# Patient Record
Sex: Male | Born: 1950
Health system: Southern US, Community
[De-identification: ages and names within clinical notes are randomized; demographics above are authoritative.]

## PROBLEM LIST (undated history)

## (undated) DIAGNOSIS — E785 Hyperlipidemia, unspecified: Secondary | ICD-10-CM

## (undated) DIAGNOSIS — I10 Essential (primary) hypertension: Secondary | ICD-10-CM

## (undated) DIAGNOSIS — E119 Type 2 diabetes mellitus without complications: Secondary | ICD-10-CM

## (undated) HISTORY — DX: Hyperlipidemia, unspecified: E78.5

---

## 2018-02-28 ENCOUNTER — Encounter (INDEPENDENT_AMBULATORY_CARE_PROVIDER_SITE_OTHER): Payer: Self-pay

## 2018-03-14 ENCOUNTER — Encounter (INDEPENDENT_AMBULATORY_CARE_PROVIDER_SITE_OTHER): Payer: Self-pay

## 2018-04-04 ENCOUNTER — Encounter (INDEPENDENT_AMBULATORY_CARE_PROVIDER_SITE_OTHER): Payer: Self-pay | Admitting: Vascular Surgery

## 2018-04-04 ENCOUNTER — Ambulatory Visit (INDEPENDENT_AMBULATORY_CARE_PROVIDER_SITE_OTHER): Payer: Medicare Other | Admitting: Vascular Surgery

## 2018-04-04 VITALS — BP 147/84 | HR 93 | Resp 16 | Ht 68.0 in | Wt 211.2 lb

## 2018-04-04 DIAGNOSIS — I739 Peripheral vascular disease, unspecified: Secondary | ICD-10-CM

## 2018-04-04 DIAGNOSIS — M79605 Pain in left leg: Secondary | ICD-10-CM

## 2018-04-04 DIAGNOSIS — E785 Hyperlipidemia, unspecified: Secondary | ICD-10-CM | POA: Diagnosis not present

## 2018-04-04 DIAGNOSIS — M79609 Pain in unspecified limb: Secondary | ICD-10-CM | POA: Insufficient documentation

## 2018-04-04 NOTE — Assessment & Plan Note (Signed)
The patient has undergone aCT angiogram.  I do not have the images for review but have reviewed the interpretation.  There appears to be mild aortoiliac disease and femoral-popliteal disease bilaterally.  The significant findings are of tibial disease most prominently in the posterior tibial arteries with stenosis/occlusion bilaterally. This finding would not typically cause hip and buttock claudication.  CT angiogram is certainly not a perfect study for disease, and I think an assessment with an exercise study with ABIs would be prudent.  If this is unrevealing, I would consider a back or hip work-up as the cause of his pain.  Certainly he does not have limb threatening issues requiring immediate intervention which is encouraging.  I will see him back following his noninvasive studies.  We discussed the pathophysiology and natural history of peripheral arterial disease.  He will continue his statin agent.  He is already on a blood thinner as well.  

## 2018-04-04 NOTE — Patient Instructions (Signed)
Peripheral Vascular Disease  Peripheral vascular disease (PVD) is a disease of the blood vessels that are not part of your heart and brain. A simple term for PVD is poor circulation. In most cases, PVD narrows the blood vessels that carry blood from your heart to the rest of your body. This can reduce the supply of blood to your arms, legs, and internal organs, like your stomach or kidneys. However, PVD most often affects a person's lower legs and feet. Without treatment, PVD tends to get worse. PVD can also lead to acute ischemic limb. This is when an arm or leg suddenly cannot get enough blood. This is a medical emergency. Follow these instructions at home: Lifestyle  Do not use any products that contain nicotine or tobacco, such as cigarettes and e-cigarettes. If you need help quitting, ask your doctor.  Lose weight if you are overweight. Or, stay at a healthy weight as told by your doctor.  Eat a diet that is low in fat and cholesterol. If you need help, ask your doctor.  Exercise regularly. Ask your doctor for activities that are right for you. General instructions  Take over-the-counter and prescription medicines only as told by your doctor.  Take good care of your feet: ? Wear comfortable shoes that fit well. ? Check your feet often for any cuts or sores.  Keep all follow-up visits as told by your doctor This is important. Contact a doctor if:  You have cramps in your legs when you walk.  You have leg pain when you are at rest.  You have coldness in a leg or foot.  Your skin changes.  You are unable to get or have an erection (erectile dysfunction).  You have cuts or sores on your feet that do not heal. Get help right away if:  Your arm or leg turns cold, numb, and blue.  Your arms or legs become red, warm, swollen, painful, or numb.  You have chest pain.  You have trouble breathing.  You suddenly have weakness in your face, arm, or leg.  You become very  confused or you cannot speak.  You suddenly have a very bad headache.  You suddenly cannot see. Summary  Peripheral vascular disease (PVD) is a disease of the blood vessels.  A simple term for PVD is poor circulation. Without treatment, PVD tends to get worse.  Treatment may include exercise, low fat and low cholesterol diet, and quitting smoking. This information is not intended to replace advice given to you by your health care provider. Make sure you discuss any questions you have with your health care provider. Document Released: 06/09/2009 Document Revised: 04/22/2016 Document Reviewed: 04/22/2016 Elsevier Interactive Patient Education  2019 [employer]  

## 2018-04-04 NOTE — Assessment & Plan Note (Signed)
lipid control important in reducing the progression of atherosclerotic disease. Continue statin therapy  

## 2018-04-04 NOTE — Assessment & Plan Note (Signed)
The patient has undergone aCT angiogram.  I do not have the images for review but have reviewed the interpretation.  There appears to be mild aortoiliac disease and femoral-popliteal disease bilaterally.  The significant findings are of tibial disease most prominently in the posterior tibial arteries with stenosis/occlusion bilaterally. This finding would not typically cause hip and buttock claudication.  CT angiogram is certainly not a perfect study for disease, and I think an assessment with an exercise study with ABIs would be prudent.  If this is unrevealing, I would consider a back or hip work-up as the cause of his pain.  Certainly he does not have limb threatening issues requiring immediate intervention which is encouraging.  I will see him back following his noninvasive studies.  We discussed the pathophysiology and natural history of peripheral arterial disease.  He will continue his statin agent.  He is already on a blood thinner as well.

## 2018-04-04 NOTE — Progress Notes (Signed)
Patient ID: Eric Guerrero, male   DOB: 1950-10-04, 68 y.o.   MRN: [id number]  Chief Complaint  Patient presents with  . Establish Care    HPI Eric RollsMohammad Guerrero is a 68 y.o. male.  I am asked to see the patient by Dr. Ellsworth Lennoxejan-Sie for evaluation of PAD.  The patient reports pain in his left buttock and hip that radiates down the back of his legs.  This happens only with walking and is relieved with rest.  This is only in the left leg with no right leg symptoms.  No ulceration.  No infection.  This has been going on now for a couple of years with slow progression.  His primary care physician ordered a CT angiogram.  I do not have the images for review but have reviewed the interpretation.  There appears to be mild aortoiliac disease and femoral-popliteal disease bilaterally.  The significant findings are of tibial disease most prominently in the posterior tibial arteries with stenosis/occlusion bilaterally.   Past Medical History:  Diagnosis Date  . Hyperlipidemia     History reviewed. No pertinent surgical history.  Family History No bleeding disorders, clotting disorders, autoimmune diseases, or aneurysms  Social History Social History   Tobacco Use  . Smoking status: Never Smoker  . Smokeless tobacco: Never Used  Substance Use Topics  . Alcohol use: Never    Frequency: Never  . Drug use: Never    No Known Allergies  Current Outpatient Medications  Medication Sig Dispense Refill  . celecoxib (CELEBREX) 200 MG capsule Take 200 mg by mouth daily.    . rivaroxaban (XARELTO) 2.5 MG TABS tablet Take 2.5 mg by mouth 2 (two) times daily.    . rosuvastatin (CRESTOR) 10 MG tablet Take 10 mg by mouth daily.     No current facility-administered medications for this visit.       REVIEW OF SYSTEMS (Negative unless checked)  Constitutional: [] Weight loss  [] Fever  [] Chills Cardiac: [] Chest pain   [] Chest pressure   [] Palpitations   [] Shortness of breath when laying flat   [] Shortness  of breath at rest   [] Shortness of breath with exertion. Vascular:  [x] Pain in legs with walking   [] Pain in legs at rest   [] Pain in legs when laying flat   [x] Claudication   [] Pain in feet when walking  [] Pain in feet at rest  [] Pain in feet when laying flat   [] History of DVT   [] Phlebitis   [] Swelling in legs   [] Varicose veins   [] Non-healing ulcers Pulmonary:   [] Uses home oxygen   [] Productive cough   [] Hemoptysis   [] Wheeze  [] COPD   [] Asthma Neurologic:  [] Dizziness  [] Blackouts   [] Seizures   [] History of stroke   [] History of TIA  [] Aphasia   [] Temporary blindness   [] Dysphagia   [] Weakness or numbness in arms   [] Weakness or numbness in legs Musculoskeletal:  [] Arthritis   [] Joint swelling   [] Joint pain   [] Low back pain Hematologic:  [] Easy bruising  [] Easy bleeding   [] Hypercoagulable state   [] Anemic  [] Hepatitis Gastrointestinal:  [] Blood in stool   [] Vomiting blood  [] Gastroesophageal reflux/heartburn   [] Abdominal pain Genitourinary:  [] Chronic kidney disease   [] Difficult urination  [] Frequent urination  [] Burning with urination   [] Hematuria Skin:  [] Rashes   [] Ulcers   [] Wounds Psychological:  [] History of anxiety   []  History of major depression.    Physical Exam BP (!) 147/84 (BP Location: Left Arm, Patient Position:  Sitting, Cuff Size: Large)   Pulse 93   Resp 16   Ht 5\' 8"  (1.727 m)   Wt 211 lb 3.2 oz (95.8 kg)   BMI 32.11 kg/m  Gen:  WD/WN, NAD. Appears younger than stated age. Head: [place]/AT, No temporalis wasting.  Ear/Nose/Throat: Hearing grossly intact, nares w/o erythema or drainage, oropharynx w/o Erythema/Exudate Eyes: Conjunctiva clear, sclera non-icteric  Neck: trachea midline.  No bruit or JVD.  Pulmonary:  Good air movement, clear to auscultation bilaterally.  Cardiac: RRR, normal S1, S2 Vascular:  Vessel Right Left  Radial Palpable Palpable                          PT 1+ Palpable Trace Palpable  DP Palpable Palpable   Gastrointestinal: soft,  non-tender/non-distended.  Musculoskeletal: M/S 5/5 throughout.  Extremities without ischemic changes.  No deformity or atrophy. No edema. Neurologic: Sensation grossly intact in extremities.  Symmetrical.  Speech is fluent. Motor exam as listed above. Psychiatric: Judgment intact, Mood & affect appropriate for pt's clinical situation. Dermatologic: No rashes or ulcers noted.  No cellulitis or open wounds.   Radiology No results found.  Labs No results found for this or any previous visit (from the past 2160 hour(s)).  Assessment/Plan:  Hyperlipidemia lipid control important in reducing the progression of atherosclerotic disease. Continue statin therapy   Pain in limb The patient has undergone aCT angiogram.  I do not have the images for review but have reviewed the interpretation.  There appears to be mild aortoiliac disease and femoral-popliteal disease bilaterally.  The significant findings are of tibial disease most prominently in the posterior tibial arteries with stenosis/occlusion bilaterally. This finding would not typically cause hip and buttock claudication.  CT angiogram is certainly not a perfect study for disease, and I think an assessment with an exercise study with ABIs would be prudent.  If this is unrevealing, I would consider a back or hip work-up as the cause of his pain.  Certainly he does not have limb threatening issues requiring immediate intervention which is encouraging.  I will see him back following his noninvasive studies.  We discussed the pathophysiology and natural history of peripheral arterial disease.  He will continue his statin agent.  He is already on a blood thinner as well.   PAD (peripheral artery disease) (HCC) The patient has undergone aCT angiogram.  I do not have the images for review but have reviewed the interpretation.  There appears to be mild aortoiliac disease and femoral-popliteal disease bilaterally.  The significant findings are of tibial  disease most prominently in the posterior tibial arteries with stenosis/occlusion bilaterally. This finding would not typically cause hip and buttock claudication.  CT angiogram is certainly not a perfect study for disease, and I think an assessment with an exercise study with ABIs would be prudent.  If this is unrevealing, I would consider a back or hip work-up as the cause of his pain.  Certainly he does not have limb threatening issues requiring immediate intervention which is encouraging.  I will see him back following his noninvasive studies.  We discussed the pathophysiology and natural history of peripheral arterial disease.  He will continue his statin agent.  He is already on a blood thinner as well.       Festus Barren 04/04/2018, 12:37 PM   This note was created with Dragon medical transcription system.  Any errors from dictation are unintentional.

## 2018-04-05 ENCOUNTER — Ambulatory Visit (INDEPENDENT_AMBULATORY_CARE_PROVIDER_SITE_OTHER): Payer: Medicare Other

## 2018-04-05 ENCOUNTER — Encounter (INDEPENDENT_AMBULATORY_CARE_PROVIDER_SITE_OTHER): Payer: Self-pay | Admitting: Vascular Surgery

## 2018-04-05 ENCOUNTER — Ambulatory Visit (INDEPENDENT_AMBULATORY_CARE_PROVIDER_SITE_OTHER): Payer: Medicare Other | Admitting: Vascular Surgery

## 2018-04-05 VITALS — BP 147/84 | HR 93 | Resp 16 | Ht 68.0 in | Wt 209.4 lb

## 2018-04-05 DIAGNOSIS — I739 Peripheral vascular disease, unspecified: Secondary | ICD-10-CM

## 2018-04-05 DIAGNOSIS — E785 Hyperlipidemia, unspecified: Secondary | ICD-10-CM | POA: Diagnosis not present

## 2018-04-05 DIAGNOSIS — M79605 Pain in left leg: Secondary | ICD-10-CM

## 2018-04-05 NOTE — Progress Notes (Signed)
Subjective:    Patient ID: Eric Guerrero, male    DOB: [birth year], 68 y.o.   MRN: [id number] Chief Complaint  Patient presents with  . Follow-up   Patient presents to review vascular studies.  The patient was last seen yesterday.  The patient continues to endorse left lower extremity claudication-like symptoms.  Denies any rest pain or ulcer formation to the bilateral lower extremity.  The patient underwent a exercise ABI which was notable for right: 0.98, triphasic tibial waveforms.  Post exercise ABI shows moderate decrease in ABI from 0.98 2.66 suggesting ischemic changes. Left: 0.87, triphasic tibial waveforms.  Post exercise ABI shows moderate decrease in ABI from 0.98 2.58 suggesting ischemic changes.  The patient denies any right lower extremity symptoms.  Patient denies any fever, nausea or vomiting.  Denies any fever, nausea vomiting.  Review of Systems  Constitutional: Negative.   HENT: Negative.   Eyes: Negative.   Respiratory: Negative.   Cardiovascular:       Left lower extremity claudication  Gastrointestinal: Negative.   Endocrine: Negative.   Genitourinary: Negative.   Musculoskeletal: Negative.   Skin: Negative.   Allergic/Immunologic: Negative.   Neurological: Negative.   Hematological: Negative.   Psychiatric/Behavioral: Negative.       Objective:   Physical Exam Vitals signs reviewed.  Constitutional:      Appearance: Normal appearance.  HENT:     Head: Normocephalic and atraumatic.     Right Ear: External ear normal.     Left Ear: External ear normal.     Nose: Nose normal.     Mouth/Throat:     Mouth: Mucous membranes are moist.     Pharynx: Oropharynx is clear.  Eyes:     Extraocular Movements: Extraocular movements intact.     Conjunctiva/sclera: Conjunctivae normal.     Pupils: Pupils are equal, round, and reactive to light.  Neck:     Musculoskeletal: Normal range of motion.  Cardiovascular:     Rate and Rhythm: Normal rate and regular  rhythm.     Pulses: Normal pulses.     Heart sounds: Normal heart sounds.  Pulmonary:     Effort: Pulmonary effort is normal.     Breath sounds: Normal breath sounds.  Musculoskeletal: Normal range of motion.        General: No swelling.  Skin:    General: Skin is warm and dry.  Neurological:     General: No focal deficit present.     Mental Status: He is alert and oriented to person, place, and time.  Psychiatric:        Mood and Affect: Mood normal.        Behavior: Behavior normal.        Thought Content: Thought content normal.        Judgment: Judgment normal.    BP (!) 147/84 (BP Location: Right Arm, Patient Position: Sitting)   Pulse 93   Resp 16   Ht 5\' 8"  (1.727 m)   Wt 209 lb 6.4 oz (95 kg)   BMI 31.84 kg/m   Past Medical History:  Diagnosis Date  . Hyperlipidemia    Social History   Socioeconomic History  . Marital status: Unknown    Spouse name: Not on file  . Number of children: Not on file  . Years of education: Not on file  . Highest education level: Not on file  Occupational History  . Not on file  Social Needs  . Financial resource strain: Not  on file  . Food insecurity:    Worry: Not on file    Inability: Not on file  . Transportation needs:    Medical: Not on file    Non-medical: Not on file  Tobacco Use  . Smoking status: Never Smoker  . Smokeless tobacco: Never Used  Substance and Sexual Activity  . Alcohol use: Never    Frequency: Never  . Drug use: Never  . Sexual activity: Not on file  Lifestyle  . Physical activity:    Days per week: Not on file    Minutes per session: Not on file  . Stress: Not on file  Relationships  . Social connections:    Talks on phone: Not on file    Gets together: Not on file    Attends religious service: Not on file    Active member of club or organization: Not on file    Attends meetings of clubs or organizations: Not on file    Relationship status: Not on file  . Intimate partner violence:     Fear of current or ex partner: Not on file    Emotionally abused: Not on file    Physically abused: Not on file    Forced sexual activity: Not on file  Other Topics Concern  . Not on file  Social History Narrative  . Not on file   No past surgical history on file.  No family history on file.  No Known Allergies     Assessment & Plan:  Patient presents to review vascular studies.  The patient was last seen yesterday.  The patient continues to endorse left lower extremity claudication-like symptoms.  Denies any rest pain or ulcer formation to the bilateral lower extremity.  The patient underwent a exercise ABI which was notable for right: 0.98, triphasic tibial waveforms.  Post exercise ABI shows moderate decrease in ABI from 0.98 2.66 suggesting ischemic changes. Left: 0.87, triphasic tibial waveforms.  Post exercise ABI shows moderate decrease in ABI from 0.98 2.58 suggesting ischemic changes.  The patient denies any right lower extremity symptoms.  Patient denies any fever, nausea or vomiting.  Denies any fever, nausea vomiting.  1. PAD (peripheral artery disease) (HCC) - New Due to the patient's progressively worsening and lifestyle limiting left hip and buttocks pain in the setting of today's reduced left lower extremity ABI and ischemic changes noted post exercise I recommend a left lower extremity angiogram with possible intervention.  An angiogram would be able to assess the patient's degree of peripheral artery disease and if appropriate an attempt to revascularize the leg can be made at that time Procedure, risks and benefits explained to the patient All questions answered At this time, the patient would like to speak to his family and his primary care physician before moving forward with the procedure I gave the patient information on what an angiogram is and the name of the procedure He is to call the office if he would like to move forward He understands that this is a  progressive disease and over time his symptoms and degree of disease can worsen He expresses his understanding  2. Hyperlipidemia, unspecified hyperlipidemia type - Stable On statin Encouraged good control as its slows the progression of atherosclerotic disease  3. Pain of left lower extremity - Stable See above  Current Outpatient Medications on File Prior to Visit  Medication Sig Dispense Refill  . celecoxib (CELEBREX) 200 MG capsule Take 200 mg by mouth daily.    .Marland Kitchen  rivaroxaban (XARELTO) 2.5 MG TABS tablet Take 2.5 mg by mouth 2 (two) times daily.    . rosuvastatin (CRESTOR) 10 MG tablet Take 10 mg by mouth daily.     No current facility-administered medications on file prior to visit.    There are no Patient Instructions on file for this visit. No follow-ups on file.   A , PA-C

## 2018-07-13 ENCOUNTER — Ambulatory Visit: Admit: 2018-07-13 | Payer: Self-pay | Admitting: Gastroenterology

## 2018-07-13 SURGERY — COLONOSCOPY WITH PROPOFOL
Anesthesia: General

## 2019-04-24 ENCOUNTER — Ambulatory Visit: Payer: Medicare Other | Attending: Internal Medicine | Admitting: Physical Therapy

## 2019-04-24 ENCOUNTER — Other Ambulatory Visit: Payer: Self-pay

## 2019-04-24 ENCOUNTER — Encounter: Payer: Self-pay | Admitting: Physical Therapy

## 2019-04-24 DIAGNOSIS — G8929 Other chronic pain: Secondary | ICD-10-CM | POA: Diagnosis present

## 2019-04-24 DIAGNOSIS — M25552 Pain in left hip: Secondary | ICD-10-CM | POA: Diagnosis present

## 2019-04-24 DIAGNOSIS — M5442 Lumbago with sciatica, left side: Secondary | ICD-10-CM | POA: Diagnosis not present

## 2019-04-24 DIAGNOSIS — R262 Difficulty in walking, not elsewhere classified: Secondary | ICD-10-CM

## 2019-04-24 DIAGNOSIS — M6281 Muscle weakness (generalized): Secondary | ICD-10-CM | POA: Diagnosis present

## 2019-04-24 DIAGNOSIS — M79622 Pain in left upper arm: Secondary | ICD-10-CM

## 2019-04-24 DIAGNOSIS — M542 Cervicalgia: Secondary | ICD-10-CM | POA: Diagnosis present

## 2019-04-24 DIAGNOSIS — M546 Pain in thoracic spine: Secondary | ICD-10-CM | POA: Diagnosis present

## 2019-04-24 DIAGNOSIS — M25512 Pain in left shoulder: Secondary | ICD-10-CM | POA: Insufficient documentation

## 2019-04-24 NOTE — Therapy (Addendum)
[facility] [address] [place], [place], [address] Phone: [phone]   Fax:  [phone]  Physical Therapy Evaluation  Patient Details  Name: Eric RollsMohammad Guerrero MRN: [id number] Date of Birth: [birth year] Referring Provider (PT): Ahmed S. Estil Daftegan-Sie, MD   Encounter Date: 04/24/2019  PT End of Session - 04/24/19 1307    Visit Number  1    Number of Visits  12    Date for PT Re-Evaluation  06/05/19    Authorization Type  [employer] reporting period from 04/24/2019    Authorization - Visit Number  1    Authorization - Number of Visits  10    PT Start Time  1100    PT Stop Time  1200    PT Time Calculation (min)  60 min    Activity Tolerance  Patient tolerated treatment well    Behavior During Therapy  [facility] tasks assessed/performed       Past Medical History:  Diagnosis Date  . Hyperlipidemia     History reviewed. No pertinent surgical history.  There were no vitals filed for this visit.   Subjective Assessment - 04/24/19 1113    Subjective  Patient states condition started about 2 years ago. He had no identifiable injury. He noticed that his left glute started feeling really tired after walking in the park causing him to feel the need to rest. He must stop walking after about 1/2 mile or 5 minutes. He stops walking and it feels better after one minute. He is unable to say if it is getting worse, better, or staying the same. About one year ago he started having the same feeling in his left axilla region. When he uses his arms a lot it starts to feel tired there and when he stops it feels better. He can use his arms for about 15 minutes before needing to stop, it gets better with 2 min of rest. He recently told his doctor about these problems at a routine visit who gave him a muscle relaxant that he feels doesn't really help, although he is still taking it. Denies numbness or tingling except sometimes he gets tingling in either  hand when sleeping on that side, but it goes away when he moves. He states his legs were checked and did not have any problem with the blood vessels. He does have PAD documented in his chart. He [place] jog [place] to this problem. He was working so much his exercises became walking, then this problem happened.    Pertinent History  Patient is a 69 y.o. male who presents to outpatient physical therapy with a referral for medical diagnosis piriformis and upper back pain. This patient's chief complaints consist of tired feeling that comes on in left glute and left axillary region with prolonged walking or use of UEs leading to the following functional deficits: difficulty walking or completing continuous UE work - limited to 15 min of each before resting;  impairers quality of life. Relevant past medical history and comorbidities include possible PAD, hyperlipidemia. Denies hx of stroke, cancer, diabetes, lung/breathing problems, seizures, heart disease, hx of major trauma or spinal surgery.    Limitations  Walking;Lifting;House hold activities;Standing   upper extremity work for over 15 min. Walking for over 5 min   How long can you walk comfortably?  5 minutes    Diagnostic tests  states PAD as a cause was ruled out  Patient Stated Goals  feel better walking good and have no more problems in the upper extremity    Currently in Pain?  No/denies   states it is not pain but tiredness.   Pain Score  0-No pain    Pain Location  Buttocks   left axilla   Pain Orientation  Left    Pain Descriptors / Indicators  Other (Comment)   "tired feeling"   Pain Type  Other (Comment)   chronic   Pain Onset  More than a month ago    Pain Frequency  Intermittent    Aggravating Factors   walking more than 5 min, using UEs for more than 15 min    Pain Relieving Factors  sitting, resting    Effect of Pain on Daily Activities  difficulty walking and using UEs (only able for about 15 min)          [facility] PT Assessment - 04/24/19 0001      Assessment   Medical Diagnosis  piriformis and upper back pain    Referring Provider (PT)  Ahmed S. Tegan-Sie, MD    Onset Date/Surgical Date  04/23/17    Hand Dominance  Right    Next MD Visit  march    Prior Therapy  not for this problem prior to current episode of care      Precautions   Precautions  None      Restrictions   Weight Bearing Restrictions  No      Balance Screen   Has the patient fallen in the past 6 months  No    Has the patient had a decrease in activity level because of a fear of falling?   No    Is the patient reluctant to leave their home because of a fear of falling?   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 Posture: forward head, rounded shoulders, slumped in sitting.  Marland Kitchen Posture correction: improved posture.   . Bed mobility and transfers: rolling, supine <> sit, prone <> sit, and sit <> stand WFL.  . Gait: grossly WFL for household and short community ambulation. More detailed gait analysis deferred to later date as needed.   NEUROLOGICAL  Upper Motor Neuron Screen Hoffman's, and Clonus (ankle) negative bilaterally  Dermatomes . C2-T1 appears equal and intact to light touch.  . L2-S2 appears equal and intact to light  touch.  Myotomes . C2-T1 appears intact . L2-S2 appears intact  Deep Tendon Reflexes R/L  . 1+/0+ Quadriceps reflex (L4) . 2+/0+ Achilles reflex (S1)  Neurodynamic Tests: Slump positive for concordant pain on left, SLR positive bilaterally. Positive for neural tension in left radial and  median nerve but symptoms felt distal to elbow.   SPINE MOTION  Cervical Spine AROM *Indicates pain Mildly restricted in all directions. No concordant pain including with OP in L sidebend, extension, and L rotatoin.  L spurlings test postive for L UT pain radiating towards posterior L shoulder.   Lumbar AROM *Indicates pain - Flexion: = fingers 1 inch from toes (usually can place hands flat on floor), L glute pain. - Extension: = 75% no pain  - Rotation: B WFL with slightly more restriction to the R compared to L - Side Flexion: R = finger below knee, L = finger above kne.  PERIPHERAL JOINT MOTION (in degrees) BUE and BLE WFL except ispilatreal glute pain with hip ER. Hip IR also restricted about 60% but not painful.   MUSCLE PERFORMANCE (MMT):  Comments: BUE 5/5; B LE WFL except L glute pain with left hip extenion with knee flexed and knee extended. B hip extension  4+/5 knees extended, 4/5 knees flexed to bias glute.   REPEATED MOTIONS TESTING: Motion/Technique sets x reps During After ROM Functional test** Stoplight Comments  Prone press up x10 Increasing left axillary region "tiredness" No worse      **Functional test:  Comments:  SPECIAL TESTS: CERVICAL SPINE - Axial compression negative - L spurlings test postive for L UT pain radiating towards posterior L shoulder. - Positive for neural tension in left radial and median nerve but symptoms felt distal to elbow.  LUMBAR SPINE - Slump positive for concordant pain on left, SLR positive bilaterally.  ACCESSORY MOTION:  - Hypomobile to CPA throughout cervical, thoracic, and lumbar spine - Localized pain with CPA at T2-4.  - Denies  tenderness or reproduction of symptoms with CPA to lumbar spine  PALPATION: - Mild TTP in left glute region - Not TTP around left upper trap, scapular region, or lat dorsi.   EDUCATION/COGNITION: Patient is alert and oriented X 4.  Objective measurements completed on examination: See above findings.   TREATMENT:   Therapeutic exercise: to centralize symptoms and improve ROM, strength, muscular endurance, and activity tolerance required for successful completion of functional activities.  - supine and seated left slider sciatic nerve glide x 5 each. More comfortable in sitting.  - seated piriformis stretch x 30 seconds - prone press up x 10 - Sidelying open book (thoracic rotation) to improve thoracic, shoulder girdle, and upper trunk mobility. Required instruction for technique and cuing to achieve end range as tolerated, hold time, and breathing technique. Left rotation only x 7. - Education on diagnosis, prognosis, POC, anatomy and physiology of current condition.  - Education on HEP including handout   HOME EXERCISE PROGRAM Access Code: [email or web address]  URL: [email or web address]  Date: 04/24/2019  Prepared by: Norton Blizzard   Exercises Seated Slump Nerve Glide - 15 reps - 2-3x daily Seated Piriformis Stretch - 3 reps - 30 seconds hold - 2x daily Prone Press Up - 10 reps - 1 second hold - 2x daily Sidelying Thoracic Rotation with Open Book - 20 reps - 1 hold - 2x daily   Patient response to treatment:  Pt tolerated treatment well. Pt was able to complete all exercises with minimal to no lasting increase in pain or discomfort. Pt required multimodal cuing for proper technique and to facilitate improved neuromuscular control, strength, range of motion, and functional ability resulting in improved performance and form.    PT Education - 04/24/19 1307    Education Details  Exercise purpose/form. Self management techniques. Education on  diagnosis, prognosis, POC, anatomy and  physiology of current condition Education on HEP including handout    Person(s) Educated  Patient    Methods  Explanation;Demonstration;Tactile cues;Verbal cues;Handout    Comprehension  Verbalized understanding;Returned demonstration;Verbal cues required;Tactile cues required;Need further instruction       PT Short Term Goals - 04/24/19 1327      PT SHORT TERM GOAL #1   Title  Be independent with initial home exercise program for self-management of symptoms.    Baseline  Initial HEP provided at IE (04/24/2019);    Time  2    Period  Weeks    Status  New    Target Date  05/08/19        PT Long Term Goals - 04/24/19 1340      PT LONG TERM GOAL #1   Title  Be independent with a long-term home exercise program for self-management of symptoms.    Baseline  Initial HEP provided at IE (04/24/2019);    Time  0    Period  Weeks    Status  New    Target Date  06/05/19      PT LONG TERM GOAL #2   Title  Patient will be able to ambluate at least 30 minutes without lower quarter symptoms to improve his ability to walk for fitness.    Baseline  5 minutes (04/24/2019);    Time  6    Period  Weeks    Status  New    Target Date  06/05/19      PT LONG TERM GOAL #3   Title  Patient will report being able to work with BUE for at least 30 minutes without symptoms to improve his abilty to complete work tasks and be able to complete responsibilities uninterrupted.    Baseline  15 minutes (04/24/2019);    Time  6    Period  Weeks    Status  New    Target Date  06/05/19      PT LONG TERM GOAL #4   Title  Have full lumbar AROM with no compensations or increase in symptoms in all planes except intermittent end range discomfort to allow patient to complete valued activities with less difficulty    Baseline  limited the most in flexion - see objective exam (04/24/2019);    Time  6    Period  Weeks    Status  New    Target Date  06/05/19      PT LONG TERM GOAL #5   Title  Patient will  demonstrate B hip  extension strength with flexed knees equal or greater than  4+/5 without symptoms to demonstrate functional strength for increased walking tolerance, reaching, lifting, carrying and increased ADL ability.    Baseline  4/5 bilaterally and painful on left (04/24/2019);    Time  6    Period  Weeks    Status  New    Target Date  06/05/19             Plan - 04/24/19 1317    Clinical Impression Statement  Patient is a 69 y.o. male referred to outpatient physical therapy with a medical diagnosis of piriformis and upper back pain who presents with signs and symptoms consistent with left glute pain and left sided sciatic nerve tension as well as left sided neck pain with neural tension in radial and median nerve distributions; pain in along left side of trunk and upper arm near axilla. Was  unable to reproduce left axillary pain this exam except with prone press up x 10 appearing to be related to repeated use of left upper arm. Suspect referral from lower cervical or upper thoracic spine. Will continue to investigate and address as appropriate. Left glute pain appears related to sciatic nerve tension but was negative to CPAs at lower lumbar spine. Report of increased pain with walking and quick recovery with sitting suggestes possible spinal stenosis, but patient showed no worsening with prone pres up (unloaded extension). Patient presents with significant activity tolerance, pain/ feeling of tiredness, ROM, and muscular performance (strength/endurance/power) impairments that are limiting ability to complete usual activities such as exercising, walking more than 5 minutes, using UEs (iincluding at work) more than 15 minutes without difficulty and decreases quality of life. Patient will benefit from skilled physical therapy intervention to address current body structure impairments and activity limitations to improve function and work towards goals set in current POC in order to return to  prior level of function or maximal functional improvement.    Personal Factors and Comorbidities  Comorbidity 1;Past/Current Experience;Time since onset of injury/illness/exacerbation;Social Background    Comorbidities  PAD, hyperlipidemia.    Examination-Activity Limitations  Lift;Carry   walking, using BUE for activities including lifting, carrying, stabilizing   Examination-Participation Restrictions  Other;Community Activity;Yard Work;Cleaning   work activites that require use of BUE; physical activity/exercise   Stability/Clinical Decision Making  Stable/Uncomplicated    Clinical Decision Making  Low    Rehab Potential  Fair    PT Frequency  2x / week    PT Duration  6 weeks    PT Treatment/Interventions  ADLs/Self Care Home Management;Aquatic Therapy;Cryotherapy;Traction;Electrical Stimulation;Moist Heat;Therapeutic activities;Therapeutic exercise;Neuromuscular re-education;Patient/family education;Manual techniques;Passive range of motion;Dry needling;Spinal Manipulations;Joint Manipulations    PT Next Visit Plan  assess response to HEP and update as appropriate, assess , manual techniques, strengthening/motor control and stretching    PT Home Exercise Plan  Medbridge Access Code: [email or web address]    Consulted and Agree with Plan of Care  Patient       Patient will benefit from skilled therapeutic intervention in order to improve the following deficits and impairments:  Decreased endurance, Decreased mobility, Difficulty walking, Hypomobility, Increased muscle spasms, Impaired perceived functional ability, Decreased range of motion, Decreased activity tolerance, Decreased strength, Impaired flexibility, Impaired UE functional use, Pain, Postural dysfunction  Visit Diagnosis: Chronic left-sided low back pain with left-sided sciatica  Cervicalgia  Pain in thoracic spine  Difficulty in walking, not elsewhere classified  Muscle weakness (generalized)  Pain in left hip  Pain in left  upper arm  Chronic left shoulder pain     Problem List Patient Active Problem List   Diagnosis Date Noted  . Hyperlipidemia 04/04/2018  . Pain in limb 04/04/2018  . PAD (peripheral artery disease) (HCC) 04/04/2018    Luretha Murphy. Ilsa Iha, PT, DPT 04/24/19, 2:04 PM  Addendum to remove unnecessary repeated information.  Luretha Murphy. Ilsa Iha, PT, DPT 04/26/19, 12:17 PM   [facility] AND SPORTS MEDICINE [address] [place], [address] Phone: [phone]   Fax:  [phone]  Name: Dutch Ing MRN: [id number] Date of Birth: November 26, 1950

## 2019-04-26 ENCOUNTER — Ambulatory Visit: Payer: Medicare Other | Admitting: Physical Therapy

## 2019-04-26 ENCOUNTER — Other Ambulatory Visit: Payer: Self-pay

## 2019-04-26 DIAGNOSIS — G8929 Other chronic pain: Secondary | ICD-10-CM

## 2019-04-26 DIAGNOSIS — M5442 Lumbago with sciatica, left side: Secondary | ICD-10-CM | POA: Diagnosis not present

## 2019-04-26 DIAGNOSIS — R262 Difficulty in walking, not elsewhere classified: Secondary | ICD-10-CM

## 2019-04-26 DIAGNOSIS — M546 Pain in thoracic spine: Secondary | ICD-10-CM

## 2019-04-26 DIAGNOSIS — M25552 Pain in left hip: Secondary | ICD-10-CM

## 2019-04-26 DIAGNOSIS — M542 Cervicalgia: Secondary | ICD-10-CM

## 2019-04-26 DIAGNOSIS — M6281 Muscle weakness (generalized): Secondary | ICD-10-CM

## 2019-04-26 DIAGNOSIS — M79622 Pain in left upper arm: Secondary | ICD-10-CM

## 2019-04-26 NOTE — Therapy (Signed)
[facility] [address] [place], [address] Phone: [phone]   Fax:  [phone]  Physical Therapy Treatment  Patient Details  Name: Eric Guerrero MRN: [id number] Date of Birth: 10-16-1950 Referring Provider (PT): Ahmed S. Estil Daft, MD   Encounter Date: 04/26/2019  PT End of Session - 04/26/19 1539    Visit Number  2    Number of Visits  12    Date for PT Re-Evaluation  06/05/19    Authorization Type  U[employer] reporting period from 04/24/2019    Authorization - Visit Number  2    Authorization - Number of Visits  10    PT Start Time  1435    PT Stop Time  1525    PT Time Calculation (min)  50 min    Activity Tolerance  Patient tolerated treatment well    Behavior During Therapy  [facility] for tasks assessed/performed       Past Medical History:  Diagnosis Date  . Hyperlipidemia     No past surgical history on file.  There were no vitals filed for this visit.  Subjective Assessment - 04/26/19 1438    Subjective  Patient reports he feels about the same as he did last session. He continues to have a tired feeling in his left glute when he walks for longer than 5 min. He report no excessive soreness or pain folloiwng last treatment session. He did not complete any of his HEP.    Pertinent History  Patient is a 69 y.o. male who presents to outpatient physical therapy with a referral for medical diagnosis piriformis and upper back pain. This patient's chief complaints consist of tired feeling that comes on in left glute and left axillary region with prolonged walking or use of UEs leading to the following functional deficits: difficulty walking or completing continuous UE work - limited to 15 min of each before resting;  impairers quality of life. Relevant past medical history and comorbidities include possible PAD, hyperlipidemia. Denies hx of stroke, cancer, diabetes, lung/breathing problems, seizures, heart  disease, hx of major trauma or spinal surgery.    Limitations  Walking;Lifting;House hold activities;Standing   upper extremity work for over 15 min. Walking for over 5 min   How long can you walk comfortably?  5 minutes    Diagnostic tests  states PAD as a cause was ruled out    Patient Stated Goals  feel better walking good and have no more problems in the upper extremity    Currently in Pain?  No/denies    Pain Onset  More than a month ago        OBJECTIVE - 6 minute walk test: 1500 feet. Increased tiredness in left glute and left axilla.  - Nustep directly after level 3: LE only, SPM < 100 = no left glute tiredness. At 2.5 minutes - continued nustep at level 3: B UE and LE, SPM < 110 = increased left axilla tiredness/pain   TREATMENT:   Therapeutic exercise:to centralize symptoms and improve ROM, strength, muscular endurance, and activity tolerance required for successful completion of functional activities.  - (see above) - NuStep level 3 using bilateral lower extremities for 2.5 min then BUE and BLE for 2 more minutes. For improved extremity mobility, muscular endurance, and activity tolerance; and to induce the analgesic effect of aerobic exercise, stimulate improved joint nutrition, and prepare body structures and systems for following interventions. SPM > 100 - seated lumbar  flexion roll out with clear theraball, forwards x 3 minutes, diagonals alternating sides x 3 minutes - standing lumbar rotation with PVC stick behind lumbar spine, x20 each way - seated figure 4 L piriformis stretch 3x30 seconds.  - thoracic extension on the wall with hands clasped behind back of neck. X 10 with 5 second hold. Shaking.  - Sidelying open book (thoracic rotation) to improve thoracic, shoulder girdle, and upper trunk mobility. Required instruction for technique and cuing to achieve end range as tolerated, hold time, and breathing technique. Added 2# DB. X 20 each side  (manual therapy -  see below)  - seated left slider sciatic nerve glide x 15. Cuing to coordinate head and LE.   Manual therapy: to reduce pain and tissue tension, improve range of motion, neuromodulation, in order to promote improved ability to complete functional activities. - rotational gapping lumbar mobilization grade V x2 each side to improve motion and decrease pain. No cavitations - STM to left gluteal region, lower lumbar paraspinals to decrease tension and pain. Included PROM  IR/ER at left hip. TTP just superior to sciatic notch.   Pt required multimodal cuing for proper technique and to facilitate improved neuromuscular control, strength, range of motion, and functional ability resulting in improved performance and form.   HOME EXERCISE PROGRAM Access Code: [email or web address]       URL: [email or web address]    Date: 04/24/2019  Prepared by: Norton Blizzard   Exercises Seated Slump Nerve Glide - 15 reps - 2-3x daily Seated Piriformis Stretch - 3 reps - 30 seconds hold - 2x daily Prone Press Up - 10 reps - 1 second hold - 2x daily Sidelying Thoracic Rotation with Open Book - 20 reps - 1 hold - 2x daily   PT Education - 04/26/19 1540    Education Details  Exercise purpose/form. Self management techniques.    Person(s) Educated  Patient    Methods  Explanation;Demonstration;Tactile cues;Verbal cues    Comprehension  Verbalized understanding;Returned demonstration;Verbal cues required;Need further instruction;Tactile cues required       PT Short Term Goals - 04/24/19 1327      PT SHORT TERM GOAL #1   Title  Be independent with initial home exercise program for self-management of symptoms.    Baseline  Initial HEP provided at IE (04/24/2019);    Time  2    Period  Weeks    Status  New    Target Date  05/08/19        PT Long Term Goals - 04/24/19 1340      PT LONG TERM GOAL #1   Title  Be independent with a long-term home exercise program for self-management of symptoms.     Baseline  Initial HEP provided at IE (04/24/2019);    Time  0    Period  Weeks    Status  New    Target Date  06/05/19      PT LONG TERM GOAL #2   Title  Patient will be able to ambluate at least 30 minutes without lower quarter symptoms to improve his ability to walk for fitness.    Baseline  5 minutes (04/24/2019);    Time  6    Period  Weeks    Status  New    Target Date  06/05/19      PT LONG TERM GOAL #3   Title  Patient will report being able to work with BUE for at least 30 minutes without symptoms to improve his  abilty to complete work tasks and be able to complete responsibilities uninterrupted.    Baseline  15 minutes (04/24/2019);    Time  6    Period  Weeks    Status  New    Target Date  06/05/19      PT LONG TERM GOAL #4   Title  Have full lumbar AROM with no compensations or increase in symptoms in all planes except intermittent end range discomfort to allow patient to complete valued activities with less difficulty    Baseline  limited the most in flexion - see objective exam (04/24/2019);    Time  6    Period  Weeks    Status  New    Target Date  06/05/19      PT LONG TERM GOAL #5   Title  Patient will demonstrate B hip  extension strength with flexed knees equal or greater than  4+/5 without symptoms to demonstrate functional strength for increased walking tolerance, reaching, lifting, carrying and increased ADL ability.    Baseline  4/5 bilaterally and painful on left (04/24/2019);    Time  6    Period  Weeks    Status  New    Target Date  06/05/19            Plan - 04/26/19 1539    Clinical Impression Statement  Patient tolerated treatment well overall with no increase in pain by end of session. He had no pain with lumbar flexion based exercises. Performed exercises that targeted hip, lumbar, thoracic, and shoulder mobility. Patient was symptomatic with use of UE during repetitive exercise (nustep and walking) but only symptomatic at left glute with  standing activity (walking) compared to sitting suggesting spinal stenosis or lumbar extension intolerance. Very stiff at lumbar vertebral segments but not tender. Patient continues to focus on feeling that muscle is tight at left glute. Has region of tenderness just superior to sciatic notch. Plan LE only nustep again next session to see if it provokes axilla pain. Patient would benefit from continued management of limiting condition by skilled physical therapist to address remaining impairments and functional limitations to work towards stated goals and return to PLOF or maximal functional independence.    Personal Factors and Comorbidities  Comorbidity 1;Past/Current Experience;Time since onset of injury/illness/exacerbation;Social Background    Comorbidities  PAD, hyperlipidemia.    Examination-Activity Limitations  Lift;Carry   walking, using BUE for activities including lifting, carrying, stabilizing   Examination-Participation Restrictions  Other;Community Activity;Yard Work;Cleaning   work activites that require use of BUE; physical activity/exercise   Stability/Clinical Decision Making  Stable/Uncomplicated    Rehab Potential  Fair    PT Frequency  2x / week    PT Duration  6 weeks    PT Treatment/Interventions  ADLs/Self Care Home Management;Aquatic Therapy;Cryotherapy;Traction;Electrical Stimulation;Moist Heat;Therapeutic activities;Therapeutic exercise;Neuromuscular re-education;Patient/family education;Manual techniques;Passive range of motion;Dry needling;Spinal Manipulations;Joint Manipulations    PT Next Visit Plan  manual techniques, strengthening/motor control and stretching    PT Home Exercise Plan  Medbridge Access Code: [email or web address]    Consulted and Agree with Plan of Care  Patient       Patient will benefit from skilled therapeutic intervention in order to improve the following deficits and impairments:  Decreased endurance, Decreased mobility, Difficulty walking, Hypomobility,  Increased muscle spasms, Impaired perceived functional ability, Decreased range of motion, Decreased activity tolerance, Decreased strength, Impaired flexibility, Impaired UE functional use, Pain, Postural dysfunction  Visit Diagnosis: Chronic left-sided low back pain with left-sided sciatica  Cervicalgia  Pain in thoracic spine  Difficulty in walking, not elsewhere classified  Muscle weakness (generalized)  Pain in left hip  Pain in left upper arm  Chronic left shoulder pain     Problem List Patient Active Problem List   Diagnosis Date Noted  . Hyperlipidemia 04/04/2018  . Pain in limb 04/04/2018  . PAD (peripheral artery disease) ([place]) 04/04/2018    Everlean Alstrom. Graylon Good, PT, DPT 04/26/19, 3:41 PM  [place] [facility] [address], [place], [address] Phone: [phone]   Fax:  [phone]  Name: Eric Guerrero MRN: [id number] Date of Birth: [birth year]

## 2019-04-30 ENCOUNTER — Encounter: Payer: Self-pay | Admitting: Physical Therapy

## 2019-04-30 ENCOUNTER — Other Ambulatory Visit: Payer: Self-pay

## 2019-04-30 ENCOUNTER — Ambulatory Visit: Payer: Medicare Other | Attending: Internal Medicine | Admitting: Physical Therapy

## 2019-04-30 VITALS — BP 140/88 | HR 93

## 2019-04-30 DIAGNOSIS — R262 Difficulty in walking, not elsewhere classified: Secondary | ICD-10-CM | POA: Insufficient documentation

## 2019-04-30 DIAGNOSIS — M25512 Pain in left shoulder: Secondary | ICD-10-CM | POA: Insufficient documentation

## 2019-04-30 DIAGNOSIS — M6281 Muscle weakness (generalized): Secondary | ICD-10-CM | POA: Diagnosis present

## 2019-04-30 DIAGNOSIS — M79622 Pain in left upper arm: Secondary | ICD-10-CM | POA: Diagnosis present

## 2019-04-30 DIAGNOSIS — M546 Pain in thoracic spine: Secondary | ICD-10-CM

## 2019-04-30 DIAGNOSIS — M5442 Lumbago with sciatica, left side: Secondary | ICD-10-CM | POA: Insufficient documentation

## 2019-04-30 DIAGNOSIS — G8929 Other chronic pain: Secondary | ICD-10-CM | POA: Insufficient documentation

## 2019-04-30 DIAGNOSIS — M25552 Pain in left hip: Secondary | ICD-10-CM

## 2019-04-30 DIAGNOSIS — M542 Cervicalgia: Secondary | ICD-10-CM | POA: Diagnosis present

## 2019-04-30 NOTE — Therapy (Signed)
[place] [facility] [address], [place], [address] Phone: [phone]   Fax:  [phone]  Physical Therapy Treatment  Patient Details  Name: Eric Guerrero MRN: [id number] Date of Birth: 12-12-1950 Referring Provider (PT): Ahmed S. Ottis Stain, MD   Encounter Date: 04/30/2019  PT End of Session - 04/30/19 1655    Visit Number  3    Number of Visits  12    Date for PT Re-Evaluation  06/05/19    Authorization Type  UHC reporting period from 04/24/2019    Authorization - Visit Number  3    Authorization - Number of Visits  10    PT Start Time  4656    PT Stop Time  1729    PT Time Calculation (min)  38 min    Activity Tolerance  Patient tolerated treatment well    Behavior During Therapy  [facility] for tasks assessed/performed       Past Medical History:  Diagnosis Date  . Hyperlipidemia     History reviewed. No pertinent surgical history.  Vitals:   04/30/19 1706  BP: 140/88  Pulse: 93  SpO2: 97%    Subjective Assessment - 04/30/19 1654    Subjective  Patient report his symptoms continue to be the same. He had some soreness in the mid back following last treatment session. He has been doing some of his HEP. States he has not had his heart checked recently.    Pertinent History  Patient is a 69 y.o. male who presents to outpatient physical therapy with a referral for medical diagnosis piriformis and upper back pain. This patient's chief complaints consist of tired feeling that comes on in left glute and left axillary region with prolonged walking or use of UEs leading to the following functional deficits: difficulty walking or completing continuous UE work - limited to 15 min of each before resting;  impairers quality of life. Relevant past medical history and comorbidities include possible PAD, hyperlipidemia. Denies hx of stroke, cancer, diabetes, lung/breathing problems, seizures, heart disease, hx of major trauma  or spinal surgery.    Limitations  Walking;Lifting;House hold activities;Standing   upper extremity work for over 15 min. Walking for over 5 min   How long can you walk comfortably?  5 minutes    Diagnostic tests  states PAD as a cause was ruled out    Patient Stated Goals  feel better walking good and have no more problems in the upper extremity    Currently in Pain?  No/denies    Pain Onset  More than a month ago       OBJECTIVE - 6 minute walk test: 1500 feet. Increased tiredness in left glute and left axilla.  - Nustep directly after level 3: LE only, SPM < 100 = no left glute tiredness. At 2.5 minutes - continued nustep at level 3: B UE and LE, SPM < 110 = increased left axilla tiredness/pain   TREATMENT:  Therapeutic exercise:to centralize symptoms and improve ROM, strength, muscular endurance, and activity tolerance required for successful completion of functional activities. - 6MWT (see above) - NuStep level 3-4 using bilateral lower extremities only for 5 min  For improved extremity mobility, muscular endurance, and activity tolerance; and to induce the analgesic effect of aerobic exercise, stimulate improved joint nutrition, and prepare body structures and systems for following interventions. SPM = 116. Reported pain in left axillary region by 3.5 min despite not using arms for exercise. Denies  dizziness or glute pain.  - seated lumbar flexion roll out with clear theraball, forwards x 3 minutes, diagonals alternating sides x 3 minutes - vitals check (see above) - passive neurodynamic testing bilateral UE: R = positive median and radial nerve. L  = positive median. No concordant pain any direction.  - hooklying bridge before each assessment of LLD. - supine MET for L anterior innominate rotation, 6 second holds x 6 with manual resistance. No significant change in LLD following.   (manual therapy - see below)   Manual therapy: to reduce pain and tissue tension, improve  range of motion, neuromodulation, in order to promote improved ability to complete functional activities. - supine long axis distraction though hip to lumbar spine 3x30 seconds each side with strap - no change in symptoms.  - supine to long sit LLD assessment (L LE longer in supine, slight decrease in length difference in long sitting).  - prone STM to left gluteal region, with and without multi-angle isometric external rotation throughout the range. TTP with reproduction of sympotms just superior to sciatic notch.  - prone CPA to lower lumbar segments of lumbar spine x 10 each level grade III-IV.  - supine L figure 4 PROM stretch for left gluteal region as tolerated. Pt finds quite uncomfortable.   Pt required multimodal cuing for proper technique and to facilitate improved neuromuscular control, strength, range of motion, and functional ability resulting in improved performance and form.   HOME EXERCISE PROGRAM Access Code: [email or web address] URL: [email or web address] Date: 04/24/2019  Prepared by: Rosita Kea   Exercises Seated Slump Nerve Glide - 15 reps - 2-3x daily Seated Piriformis Stretch - 3 reps - 30 seconds hold - 2x daily Prone Press Up - 10 reps - 1 second hold - 2x daily Sidelying Thoracic Rotation with Open Book - 20 reps - 1 hold - 2x daily    PT Education - 04/30/19 1655    Education Details  Exercise purpose/form. Self management techniques.    Person(s) Educated  Patient    Methods  Explanation;Demonstration;Tactile cues;Verbal cues    Comprehension  Verbalized understanding;Returned demonstration;Verbal cues required;Tactile cues required;Need further instruction       PT Short Term Goals - 04/30/19 1906      PT SHORT TERM GOAL #1   Title  Be independent with initial home exercise program for self-management of symptoms.    Baseline  Initial HEP provided at IE (04/24/2019);    Time  2    Period  Weeks    Status  Achieved    Target Date   05/08/19        PT Long Term Goals - 04/24/19 1340      PT LONG TERM GOAL #1   Title  Be independent with a long-term home exercise program for self-management of symptoms.    Baseline  Initial HEP provided at IE (04/24/2019);    Time  0    Period  Weeks    Status  New    Target Date  06/05/19      PT LONG TERM GOAL #2   Title  Patient will be able to ambluate at least 30 minutes without lower quarter symptoms to improve his ability to walk for fitness.    Baseline  5 minutes (04/24/2019);    Time  6    Period  Weeks    Status  New    Target Date  06/05/19      PT LONG TERM GOAL #3  Title  Patient will report being able to work with BUE for at least 30 minutes without symptoms to improve his abilty to complete work tasks and be able to complete responsibilities uninterrupted.    Baseline  15 minutes (04/24/2019);    Time  6    Period  Weeks    Status  New    Target Date  06/05/19      PT LONG TERM GOAL #4   Title  Have full lumbar AROM with no compensations or increase in symptoms in all planes except intermittent end range discomfort to allow patient to complete valued activities with less difficulty    Baseline  limited the most in flexion - see objective exam (04/24/2019);    Time  6    Period  Weeks    Status  New    Target Date  06/05/19      PT LONG TERM GOAL #5   Title  Patient will demonstrate B hip  extension strength with flexed knees equal or greater than  4+/5 without symptoms to demonstrate functional strength for increased walking tolerance, reaching, lifting, carrying and increased ADL ability.    Baseline  4/5 bilaterally and painful on left (04/24/2019);    Time  6    Period  Weeks    Status  New    Target Date  06/05/19            Plan - 04/30/19 1906    Clinical Impression Statement  Patient tolerated treatment well overall. Does have reproduction of left axillary pain produced with cardiovascular exertion on nustep attempting to use B LE only and  resolves with rest. Contacted referring physician to report symptoms and inquire about cardiovascular health. Blood pressure, HR, and SpO2 WFL following exercise. Continued with manual techniques to affect the lower quarter/hip with no obvious influence of symptoms while in clinic. Does have some upper limb neuro tension, but without production of concordant pain. Patient would benefit from continued management of limiting condition by skilled physical therapist to address remaining impairments and functional limitations to work towards stated goals and return to PLOF or maximal functional independence.    Personal Factors and Comorbidities  Comorbidity 1;Past/Current Experience;Time since onset of injury/illness/exacerbation;Social Background    Comorbidities  PAD, hyperlipidemia.    Examination-Activity Limitations  Lift;Carry   walking, using BUE for activities including lifting, carrying, stabilizing   Examination-Participation Restrictions  Other;Community Activity;Yard Work;Cleaning   work activites that require use of BUE; physical activity/exercise   Stability/Clinical Decision Making  Stable/Uncomplicated    Rehab Potential  Fair    PT Frequency  2x / week    PT Duration  6 weeks    PT Treatment/Interventions  ADLs/Self Care Home Management;Aquatic Therapy;Cryotherapy;Traction;Electrical Stimulation;Moist Heat;Therapeutic activities;Therapeutic exercise;Neuromuscular re-education;Patient/family education;Manual techniques;Passive range of motion;Dry needling;Spinal Manipulations;Joint Manipulations    PT Next Visit Plan  manual techniques, strengthening/motor control and stretching    PT Home Exercise Plan  Medbridge Access Code: [email or web address]    Consulted and Agree with Plan of Care  Patient       Patient will benefit from skilled therapeutic intervention in order to improve the following deficits and impairments:  Decreased endurance, Decreased mobility, Difficulty walking, Hypomobility,  Increased muscle spasms, Impaired perceived functional ability, Decreased range of motion, Decreased activity tolerance, Decreased strength, Impaired flexibility, Impaired UE functional use, Pain, Postural dysfunction  Visit Diagnosis: Chronic left-sided low back pain with left-sided sciatica  Cervicalgia  Pain in thoracic spine  Difficulty in walking, not  elsewhere classified  Muscle weakness (generalized)  Pain in left hip  Pain in left upper arm  Chronic left shoulder pain     Problem List Patient Active Problem List   Diagnosis Date Noted  . Hyperlipidemia 04/04/2018  . Pain in limb 04/04/2018  . PAD (peripheral artery disease) ([place]) 04/04/2018    Everlean Alstrom. Graylon Good, PT, DPT 04/30/19, 7:07 PM  [place] [facility] [address] [place], [address] Phone: [phone]   Fax:  [phone]  Name: Eric Guerrero MRN: [id number] Date of Birth: 1950-07-17

## 2019-05-02 ENCOUNTER — Ambulatory Visit: Payer: Medicare Other | Admitting: Physical Therapy

## 2019-05-02 ENCOUNTER — Encounter: Payer: Self-pay | Admitting: Physical Therapy

## 2019-05-02 ENCOUNTER — Other Ambulatory Visit: Payer: Self-pay

## 2019-05-02 DIAGNOSIS — R262 Difficulty in walking, not elsewhere classified: Secondary | ICD-10-CM

## 2019-05-02 DIAGNOSIS — M25552 Pain in left hip: Secondary | ICD-10-CM

## 2019-05-02 DIAGNOSIS — M542 Cervicalgia: Secondary | ICD-10-CM

## 2019-05-02 DIAGNOSIS — M5442 Lumbago with sciatica, left side: Secondary | ICD-10-CM | POA: Diagnosis not present

## 2019-05-02 DIAGNOSIS — M79622 Pain in left upper arm: Secondary | ICD-10-CM

## 2019-05-02 DIAGNOSIS — M6281 Muscle weakness (generalized): Secondary | ICD-10-CM

## 2019-05-02 DIAGNOSIS — M25512 Pain in left shoulder: Secondary | ICD-10-CM

## 2019-05-02 DIAGNOSIS — G8929 Other chronic pain: Secondary | ICD-10-CM

## 2019-05-02 DIAGNOSIS — M546 Pain in thoracic spine: Secondary | ICD-10-CM

## 2019-05-02 NOTE — Therapy (Signed)
[facility] [address] [place], [address] Phone: [phone]   Fax:  [phone]  Physical Therapy Treatment  Patient Details  Name: Eric Guerrero MRN: [id number] Date of Birth: December 03, 1950 Referring Provider (PT): Ahmed S. Estil Daft, MD   Encounter Date: 05/02/2019  PT End of Session - 05/02/19 1538    Visit Number  4    Number of Visits  12    Date for PT Re-Evaluation  06/05/19    Authorization Type  [employer] reporting period from 04/24/2019    Authorization - Visit Number  4    Authorization - Number of Visits  10    PT Start Time  1520    PT Stop Time  1558    PT Time Calculation (min)  38 min    Activity Tolerance  Patient tolerated treatment well    Behavior During Therapy  WFL for tasks assessed/performed       Past Medical History:  Diagnosis Date  . Hyperlipidemia     History reviewed. No pertinent surgical history.  There were no vitals filed for this visit.  Subjective Assessment - 05/02/19 1519    Subjective  Patient reports he has some soreness in his low back that he attriutes to exercising more than usual. Rates it 2/10 (20%). Reports no excessive pain or soreness following last treatment session.    Pertinent History  Patient is a 69 y.o. male who presents to outpatient physical therapy with a referral for medical diagnosis piriformis and upper back pain. This patient's chief complaints consist of tired feeling that comes on in left glute and left axillary region with prolonged walking or use of UEs leading to the following functional deficits: difficulty walking or completing continuous UE work - limited to 15 min of each before resting;  impairers quality of life. Relevant past medical history and comorbidities include possible PAD, hyperlipidemia. Denies hx of stroke, cancer, diabetes, lung/breathing problems, seizures, heart disease, hx of major trauma or spinal surgery.    Limitations   Walking;Lifting;House hold activities;Standing   upper extremity work for over 15 min. Walking for over 5 min   How long can you walk comfortably?  5 minutes    Diagnostic tests  states PAD as a cause was ruled out    Patient Stated Goals  feel better walking good and have no more problems in the upper extremity    Currently in Pain?  Yes    Pain Score  2     Pain Location  Back    Pain Onset  More than a month ago       TREATMENT:  Therapeutic exercise:to centralize symptoms and improve ROM, strength, muscular endurance, and activity tolerance required for successful completion of functional activities. - hooklying LTR x 2 min (no theraball) - double knees to chest with green theraball under feet, x 2 min - supine bridge x 20 with BLE (10 with blue theraband around distal thighs for isometric abduction).  - single leg bridge x10 each side (discomfort at upper back) - sidelying clam shell x 20 each side (no band), x 10 each side (red theraband).   - standing row x 10 with blue theraband - standing shoulder extension x 10 against red theraband (anchored at waist level) - standing wall angel x10 - Education on HEP including handout   Pt required multimodal cuing for proper technique and to facilitate improved neuromuscular control, strength, range of motion, and functional ability  resulting in improved performance and form. Had a lot of difficulty controlling scapulae.    Manual therapy:to reduce pain and tissue tension, improve range of motion, neuromodulation, in order to promote improved ability to complete functional activities. - left hip mobilization grade III-IV with belt (patient supine), posteriolateral, caudal to decrease tension and pain.  - PROM left figure 4 stretch with hip distraction using mobilization belt 3x30 seconds.   HOME EXERCISE PROGRAM Access Code: [email or web address]  URL: [email or web address]  Date: 05/02/2019  Prepared by: Norton Blizzard    Exercises Seated Slump Nerve Glide - 15 reps - 2-3x daily Seated Piriformis Stretch - 3 reps - 30 seconds hold - 2x daily Prone Press Up - 10 reps - 1 second hold - 2x daily Sidelying Thoracic Rotation with Open Book - 20 reps - 1 hold - 2x daily Clam with Resistance - 3 sets - 10 reps - 1 every other day   PT Education - 05/02/19 1538    Education Details  Exercise purpose/form. Self management techniques.    Person(s) Educated  Patient    Methods  Explanation;Demonstration;Tactile cues;Verbal cues    Comprehension  Verbalized understanding;Returned demonstration;Tactile cues required;Need further instruction       PT Short Term Goals - 04/30/19 1906      PT SHORT TERM GOAL #1   Title  Be independent with initial home exercise program for self-management of symptoms.    Baseline  Initial HEP provided at IE (04/24/2019);    Time  2    Period  Weeks    Status  Achieved    Target Date  05/08/19        PT Long Term Goals - 04/24/19 1340      PT LONG TERM GOAL #1   Title  Be independent with a long-term home exercise program for self-management of symptoms.    Baseline  Initial HEP provided at IE (04/24/2019);    Time  0    Period  Weeks    Status  New    Target Date  06/05/19      PT LONG TERM GOAL #2   Title  Patient will be able to ambluate at least 30 minutes without lower quarter symptoms to improve his ability to walk for fitness.    Baseline  5 minutes (04/24/2019);    Time  6    Period  Weeks    Status  New    Target Date  06/05/19      PT LONG TERM GOAL #3   Title  Patient will report being able to work with BUE for at least 30 minutes without symptoms to improve his abilty to complete work tasks and be able to complete responsibilities uninterrupted.    Baseline  15 minutes (04/24/2019);    Time  6    Period  Weeks    Status  New    Target Date  06/05/19      PT LONG TERM GOAL #4   Title  Have full lumbar AROM with no compensations or increase in symptoms  in all planes except intermittent end range discomfort to allow patient to complete valued activities with less difficulty    Baseline  limited the most in flexion - see objective exam (04/24/2019);    Time  6    Period  Weeks    Status  New    Target Date  06/05/19      PT LONG TERM GOAL #5   Title  Patient will  demonstrate B hip  extension strength with flexed knees equal or greater than  4+/5 without symptoms to demonstrate functional strength for increased walking tolerance, reaching, lifting, carrying and increased ADL ability.    Baseline  4/5 bilaterally and painful on left (04/24/2019);    Time  6    Period  Weeks    Status  New    Target Date  06/05/19            Plan - 05/02/19 1939    Clinical Impression Statement  Patient tolerated treatment well overall with some difficulty due to pain at the left glute during clam shells, figure 4 stretch. Found exercises very challenging and had some discomfort in thoracic spine with bridges. Felt pain at left glutes with clam shell exercise with and without band, but upon further assessment seemed to be consistent with muscular fatigue.  Session focused on mobility and strength for trunk, hips, and shoulder girdle. Patient had difficulty stabilizing and coordinating scapulae. Patient would benefit from continued management of limiting condition by skilled physical therapist to address remaining impairments and functional limitations to work towards stated goals and return to PLOF or maximal functional independence.    Personal Factors and Comorbidities  Comorbidity 1;Past/Current Experience;Time since onset of injury/illness/exacerbation;Social Background    Comorbidities  PAD, hyperlipidemia.    Examination-Activity Limitations  Lift;Carry   walking, using BUE for activities including lifting, carrying, stabilizing   Examination-Participation Restrictions  Other;Community Activity;Yard Work;Cleaning   work activites that require use of  BUE; physical activity/exercise   Stability/Clinical Decision Making  Stable/Uncomplicated    Rehab Potential  Fair    PT Frequency  2x / week    PT Duration  6 weeks    PT Treatment/Interventions  ADLs/Self Care Home Management;Aquatic Therapy;Cryotherapy;Traction;Electrical Stimulation;Moist Heat;Therapeutic activities;Therapeutic exercise;Neuromuscular re-education;Patient/family education;Manual techniques;Passive range of motion;Dry needling;Spinal Manipulations;Joint Manipulations    PT Next Visit Plan  manual techniques, strengthening/motor control and stretching    PT Home Exercise Plan  Medbridge Access Code: [email or web address]    Consulted and Agree with Plan of Care  Patient       Patient will benefit from skilled therapeutic intervention in order to improve the following deficits and impairments:  Decreased endurance, Decreased mobility, Difficulty walking, Hypomobility, Increased muscle spasms, Impaired perceived functional ability, Decreased range of motion, Decreased activity tolerance, Decreased strength, Impaired flexibility, Impaired UE functional use, Pain, Postural dysfunction  Visit Diagnosis: Chronic left-sided low back pain with left-sided sciatica  Cervicalgia  Pain in thoracic spine  Difficulty in walking, not elsewhere classified  Muscle weakness (generalized)  Pain in left hip  Pain in left upper arm  Chronic left shoulder pain     Problem List Patient Active Problem List   Diagnosis Date Noted  . Hyperlipidemia 04/04/2018  . Pain in limb 04/04/2018  . PAD (peripheral artery disease) ([place]) 04/04/2018    Everlean Alstrom. Graylon Good, PT, DPT 05/02/19, 7:40 PM  [place] [facility] [address], [place], [address] Phone: [phone]   Fax:  [phone]  Name: Eric Guerrero MRN: [id number] Date of Birth: 1950-10-04

## 2019-05-03 ENCOUNTER — Telehealth: Payer: Self-pay | Admitting: Physical Therapy

## 2019-05-03 NOTE — Telephone Encounter (Signed)
Called patient's PCP to report left axillary region pain comes on with aerobic exercise (including exercise with LEs only) and resolves with rest and that I am unable to provoke it mechanically. Left message reporting these symptoms and noting the location and behavior can be consistent with angina. Requested a call back for advice for if the patient has been evaluated for cardiac source of pain or if any changes need to be made in physical therapy treatment. No answer, so left voicemail. Provided call back number: [phone].   Luretha Murphy. Ilsa Iha, PT, DPT 05/03/19, 2:27 PM

## 2019-05-07 ENCOUNTER — Encounter: Payer: Self-pay | Admitting: Physical Therapy

## 2019-05-07 ENCOUNTER — Ambulatory Visit: Payer: Medicare Other | Admitting: Physical Therapy

## 2019-05-07 ENCOUNTER — Other Ambulatory Visit: Payer: Self-pay

## 2019-05-07 DIAGNOSIS — M6281 Muscle weakness (generalized): Secondary | ICD-10-CM

## 2019-05-07 DIAGNOSIS — R262 Difficulty in walking, not elsewhere classified: Secondary | ICD-10-CM

## 2019-05-07 DIAGNOSIS — M25512 Pain in left shoulder: Secondary | ICD-10-CM

## 2019-05-07 DIAGNOSIS — G8929 Other chronic pain: Secondary | ICD-10-CM

## 2019-05-07 DIAGNOSIS — M25552 Pain in left hip: Secondary | ICD-10-CM

## 2019-05-07 DIAGNOSIS — M5442 Lumbago with sciatica, left side: Secondary | ICD-10-CM | POA: Diagnosis not present

## 2019-05-07 DIAGNOSIS — M79622 Pain in left upper arm: Secondary | ICD-10-CM

## 2019-05-07 DIAGNOSIS — M542 Cervicalgia: Secondary | ICD-10-CM

## 2019-05-07 DIAGNOSIS — M546 Pain in thoracic spine: Secondary | ICD-10-CM

## 2019-05-07 NOTE — Therapy (Signed)
[facility] [address] [place], [address] Phone: [phone]   Fax:  [phone]  Physical Therapy Treatment  Patient Details  Name: Eric Guerrero MRN: [id number] Date of Birth: [birth year] Referring Provider (PT): Ahmed S. Estil Daft, MD   Encounter Date: 05/07/2019  PT End of Session - 05/07/19 1843    Visit Number  5    Number of Visits  12    Date for PT Re-Evaluation  06/05/19    Authorization Type  UHC reporting period from 04/24/2019    Authorization - Visit Number  5    Authorization - Number of Visits  10    PT Start Time  1530    PT Stop Time  1600    PT Time Calculation (min)  30 min    Activity Tolerance  Patient tolerated treatment well    Behavior During Therapy  [facility] for tasks assessed/performed       Past Medical History:  Diagnosis Date  . Hyperlipidemia     History reviewed. No pertinent surgical history.  There were no vitals filed for this visit.  Subjective Assessment - 05/07/19 1533    Subjective  Patient reports he has some pain in his right thoracic region 3/10 upon arrival when he stands from sitting and rotates. This is new. he reports his left glute pain is a little bit better. HEP is going okay. No excessive pain or soreness following last treatment session. States his PCP has not called hiim but he wants to speak to him about his heart and a possible stress test when he sees him next in March. Reports he can walk a bit longer before he gets left glute pain and it is not as bad as before.    Pertinent History  Patient is a 69 y.o. male who presents to outpatient physical therapy with a referral for medical diagnosis piriformis and upper back pain. This patient's chief complaints consist of tired feeling that comes on in left glute and left axillary region with prolonged walking or use of UEs leading to the following functional deficits: difficulty walking or completing  continuous UE work - limited to 15 min of each before resting;  impairers quality of life. Relevant past medical history and comorbidities include possible PAD, hyperlipidemia. Denies hx of stroke, cancer, diabetes, lung/breathing problems, seizures, heart disease, hx of major trauma or spinal surgery.    Limitations  Walking;Lifting;House hold activities;Standing   upper extremity work for over 15 min. Walking for over 5 min   How long can you walk comfortably?  5 minutes    Diagnostic tests  states PAD as a cause was ruled out    Patient Stated Goals  feel better walking good and have no more problems in the upper extremity    Currently in Pain?  Yes    Pain Score  3     Pain Location  Back    Pain Orientation  Upper;Right;Posterior    Pain Onset  More than a month ago         TREATMENT:  Therapeutic exercise:to centralize symptoms and improve ROM, strength, muscular endurance, and activity tolerance required for successful completion of functional activities. - hooklying LTR x 2 min (green theraball) - double knees to chest with green theraball under feet, x 2 min - single leg bridge x10 each side (discomfort at upper back) - toe touch modified single leg sit <> stand, x10 each side (feels in knees).  -  sidelying clam shell x10, x15 each side (green theraband).    Pt required multimodal cuing for proper technique and to facilitate improved neuromuscular control, strength, range of motion, and functional ability resulting in improved performance and form. Had a lot of difficulty controlling scapulae.   Manual therapy:to reduce pain and tissue tension, improve range of motion, neuromodulation, in order to promote improved ability to complete functional activities. - left hip mobilization grade III-IV with belt (patient supine), posteriolateral, caudal to decrease tension and pain.  - PROM left figure 4 stretch with hip distraction using mobilization belt 3x30 seconds.  - prone  CPA grade III-IV along thoracic spine and right rib springing to tolerance to decrease thoracic discomfort.  Cavitation felt at upper thoracic spine.  - prone grade V CPA to mid and lower thoracic spine, no cavitation.   HOME EXERCISE PROGRAM Access Code: [email or web address]       URL: [email or web address]    Date: 05/02/2019  Prepared by: Norton Blizzard   Exercises Seated Slump Nerve Glide - 15 reps - 2-3x daily Seated Piriformis Stretch - 3 reps - 30 seconds hold - 2x daily Prone Press Up - 10 reps - 1 second hold - 2x daily Sidelying Thoracic Rotation with Open Book - 20 reps - 1 hold - 2x daily Clam with Resistance - 3 sets - 10 reps - 1 every other day     PT Education - 05/07/19 1843    Education Details  Exercise purpose/form. Self management techniques.    Person(s) Educated  Patient    Methods  Explanation;Demonstration;Tactile cues;Verbal cues    Comprehension  Verbalized understanding;Returned demonstration;Verbal cues required;Tactile cues required;Need further instruction       PT Short Term Goals - 04/30/19 1906      PT SHORT TERM GOAL #1   Title  Be independent with initial home exercise program for self-management of symptoms.    Baseline  Initial HEP provided at IE (04/24/2019);    Time  2    Period  Weeks    Status  Achieved    Target Date  05/08/19        PT Long Term Goals - 04/24/19 1340      PT LONG TERM GOAL #1   Title  Be independent with a long-term home exercise program for self-management of symptoms.    Baseline  Initial HEP provided at IE (04/24/2019);    Time  0    Period  Weeks    Status  New    Target Date  06/05/19      PT LONG TERM GOAL #2   Title  Patient will be able to ambluate at least 30 minutes without lower quarter symptoms to improve his ability to walk for fitness.    Baseline  5 minutes (04/24/2019);    Time  6    Period  Weeks    Status  New    Target Date  06/05/19      PT LONG TERM GOAL #3   Title  Patient will  report being able to work with BUE for at least 30 minutes without symptoms to improve his abilty to complete work tasks and be able to complete responsibilities uninterrupted.    Baseline  15 minutes (04/24/2019);    Time  6    Period  Weeks    Status  New    Target Date  06/05/19      PT LONG TERM GOAL #4   Title  Have full lumbar  AROM with no compensations or increase in symptoms in all planes except intermittent end range discomfort to allow patient to complete valued activities with less difficulty    Baseline  limited the most in flexion - see objective exam (04/24/2019);    Time  6    Period  Weeks    Status  New    Target Date  06/05/19      PT LONG TERM GOAL #5   Title  Patient will demonstrate B hip  extension strength with flexed knees equal or greater than  4+/5 without symptoms to demonstrate functional strength for increased walking tolerance, reaching, lifting, carrying and increased ADL ability.    Baseline  4/5 bilaterally and painful on left (04/24/2019);    Time  6    Period  Weeks    Status  New    Target Date  06/05/19            Plan - 05/07/19 1849    Clinical Impression Statement  Patient tolerated treatment well overall and stated he felt his thoracic spine felt better by end of session. Did have R thoracic pain produced with single leg bridge, so discontinued after one set. Also felt limited by knee discomfort after modified single leg sit <> stands. Continued glute strengthening and manual therapy techniques for the hip due to report of improvement following last treatment session. Agreed with patient to follow up with physician to clear cardiac causes of pain at a future visit. Patient would benefit from continued management of limiting condition by skilled physical therapist to address remaining impairments and functional limitations to work towards stated goals and return to PLOF or maximal functional independence.    Personal Factors and Comorbidities   Comorbidity 1;Past/Current Experience;Time since onset of injury/illness/exacerbation;Social Background    Comorbidities  PAD, hyperlipidemia.    Examination-Activity Limitations  Lift;Carry   walking, using BUE for activities including lifting, carrying, stabilizing   Examination-Participation Restrictions  Other;Community Activity;Yard Work;Cleaning   work activites that require use of BUE; physical activity/exercise   Stability/Clinical Decision Making  Stable/Uncomplicated    Rehab Potential  Fair    PT Frequency  2x / week    PT Duration  6 weeks    PT Treatment/Interventions  ADLs/Self Care Home Management;Aquatic Therapy;Cryotherapy;Traction;Electrical Stimulation;Moist Heat;Therapeutic activities;Therapeutic exercise;Neuromuscular re-education;Patient/family education;Manual techniques;Passive range of motion;Dry needling;Spinal Manipulations;Joint Manipulations    PT Next Visit Plan  manual techniques, strengthening/motor control and stretching    PT Home Exercise Plan  Medbridge Access Code: [email or web address]    Consulted and Agree with Plan of Care  Patient       Patient will benefit from skilled therapeutic intervention in order to improve the following deficits and impairments:  Decreased endurance, Decreased mobility, Difficulty walking, Hypomobility, Increased muscle spasms, Impaired perceived functional ability, Decreased range of motion, Decreased activity tolerance, Decreased strength, Impaired flexibility, Impaired UE functional use, Pain, Postural dysfunction  Visit Diagnosis: Chronic left-sided low back pain with left-sided sciatica  Cervicalgia  Pain in thoracic spine  Difficulty in walking, not elsewhere classified  Muscle weakness (generalized)  Pain in left hip  Pain in left upper arm  Chronic left shoulder pain     Problem List Patient Active Problem List   Diagnosis Date Noted  . Hyperlipidemia 04/04/2018  . Pain in limb 04/04/2018  . PAD (peripheral  artery disease) ([facility]) 04/04/2018   Luretha Murphy. Ilsa Iha, PT, DPT 05/07/19, 6:50 PM  [facility] [address] [place], [address] Phone: [phone]   Fax:  [phone]  Name: Eric Guerrero MRN: [id number] Date of Birth: [birth year]

## 2019-05-09 ENCOUNTER — Ambulatory Visit: Payer: Medicare Other | Admitting: Physical Therapy

## 2019-05-09 ENCOUNTER — Encounter: Payer: Self-pay | Admitting: Physical Therapy

## 2019-05-09 ENCOUNTER — Other Ambulatory Visit: Payer: Self-pay

## 2019-05-09 DIAGNOSIS — M546 Pain in thoracic spine: Secondary | ICD-10-CM

## 2019-05-09 DIAGNOSIS — M5442 Lumbago with sciatica, left side: Secondary | ICD-10-CM

## 2019-05-09 DIAGNOSIS — M25552 Pain in left hip: Secondary | ICD-10-CM

## 2019-05-09 DIAGNOSIS — R262 Difficulty in walking, not elsewhere classified: Secondary | ICD-10-CM

## 2019-05-09 DIAGNOSIS — M542 Cervicalgia: Secondary | ICD-10-CM

## 2019-05-09 DIAGNOSIS — G8929 Other chronic pain: Secondary | ICD-10-CM

## 2019-05-09 DIAGNOSIS — M79622 Pain in left upper arm: Secondary | ICD-10-CM

## 2019-05-09 DIAGNOSIS — M6281 Muscle weakness (generalized): Secondary | ICD-10-CM

## 2019-05-09 NOTE — Therapy (Signed)
[place] [facility] [address], [place], [address] Phone: [phone]   Fax:  [phone]  Physical Therapy Treatment  Patient Details  Name: Eric Guerrero MRN: [id number] Date of Birth: 11-11-1950 Referring Provider (PT): Ahmed S. Estil Daft, MD   Encounter Date: 05/09/2019  PT End of Session - 05/09/19 1453    Visit Number  6    Number of Visits  12    Date for PT Re-Evaluation  06/05/19    Authorization Type  UHC reporting period from 04/24/2019    Authorization - Visit Number  6    Authorization - Number of Visits  10    PT Start Time  1435    PT Stop Time  1513    PT Time Calculation (min)  38 min    Activity Tolerance  Patient tolerated treatment well    Behavior During Therapy  [facility] for tasks assessed/performed       Past Medical History:  Diagnosis Date  . Hyperlipidemia     History reviewed. No pertinent surgical history.  There were no vitals filed for this visit.  Subjective Assessment - 05/09/19 1438    Subjective  Patient reports he is feeling no pain upon arrival today. Reports his left glute is feeling better and he has not been able to test his left axillary region. States his knees and right thoracic spine feel better.    Pertinent History  Patient is a 69 y.o. male who presents to outpatient physical therapy with a referral for medical diagnosis piriformis and upper back pain. This patient's chief complaints consist of tired feeling that comes on in left glute and left axillary region with prolonged walking or use of UEs leading to the following functional deficits: difficulty walking or completing continuous UE work - limited to 15 min of each before resting;  impairers quality of life. Relevant past medical history and comorbidities include possible PAD, hyperlipidemia. Denies hx of stroke, cancer, diabetes, lung/breathing problems, seizures, heart disease, hx of major trauma or spinal  surgery.    Limitations  Walking;Lifting;House hold activities;Standing   upper extremity work for over 15 min. Walking for over 5 min   How long can you walk comfortably?  5 minutes    Diagnostic tests  states PAD as a cause was ruled out    Patient Stated Goals  feel better walking good and have no more problems in the upper extremity    Currently in Pain?  No/denies    Pain Onset  More than a month ago       TREATMENT:  Therapeutic exercise:to centralize symptoms and improve ROM, strength, muscular endurance, and activity tolerance required for successful completion of functional activities. - hooklying LTR x 2 min (green theraball) - double knees to chest with green theraball under feet, x 2 min Felt cramping in right adductors when shifting positions and needed to stand up.  - ambulation x 200 feet to help relax cramp (improved but pt states he cannot lay down now).  - step ups to 12 inch step with light unilateral UE support, 2x10 each side with 2 min break in between (feels at left axilla slightly) - standing hip abduction (fire hydrant) with red band around distal thighs, BUE support, 2x10 each side.  - quadruped thoracic rotation x 10 each side and each position (hand behind head, then hand over lumbar spine). To improve thoracic mobility and strength.  - quadruped donkey kicks, attempted with 10#  DB held behind knee, but unable to control. 2x10 each side no weight.    Pt required multimodal cuing for proper technique and to facilitate improved neuromuscular control, strength, range of motion, and functional ability resulting in improved performance and form.Had a lot of difficulty controlling scapulae.  Manual therapy:to reduce pain and tissue tension, improve range of motion, neuromodulation, in order to promote improved ability to complete functional activities. - left hip mobilization grade III-IV with belt (patient hooklying), posteriolateral, caudal to decrease  tension and pain.  - PROM left figure 4 stretch with hip distraction using mobilization belt 3x30 seconds. - supine left glute IR mobilization with left leg in hooklying crossed over right leg, clinician pushing at greater trochanter to stretch the deep external rotators 2x 30 seconds. Patient did not feel much difference with this intervention.   HOME EXERCISE PROGRAM Access Code: [email or web address] URL: [email or web address] Date: 05/02/2019  Prepared by: Norton Blizzard   Exercises Seated Slump Nerve Glide - 15 reps - 2-3x daily Seated Piriformis Stretch - 3 reps - 30 seconds hold - 2x daily Prone Press Up - 10 reps - 1 second hold - 2x daily Sidelying Thoracic Rotation with Open Book - 20 reps - 1 hold - 2x daily Clam with Resistance - 3 sets - 10 reps - 1 every other day    PT Education - 05/09/19 1440    Education Details  Exercise purpose/form. Self management techniques.    Person(s) Educated  Patient    Methods  Explanation;Demonstration;Tactile cues;Verbal cues    Comprehension  Verbalized understanding;Returned demonstration;Verbal cues required;Tactile cues required;Need further instruction       PT Short Term Goals - 04/30/19 1906      PT SHORT TERM GOAL #1   Title  Be independent with initial home exercise program for self-management of symptoms.    Baseline  Initial HEP provided at IE (04/24/2019);    Time  2    Period  Weeks    Status  Achieved    Target Date  05/08/19        PT Long Term Goals - 04/24/19 1340      PT LONG TERM GOAL #1   Title  Be independent with a long-term home exercise program for self-management of symptoms.    Baseline  Initial HEP provided at IE (04/24/2019);    Time  0    Period  Weeks    Status  New    Target Date  06/05/19      PT LONG TERM GOAL #2   Title  Patient will be able to ambluate at least 30 minutes without lower quarter symptoms to improve his ability to walk for fitness.    Baseline  5 minutes  (04/24/2019);    Time  6    Period  Weeks    Status  New    Target Date  06/05/19      PT LONG TERM GOAL #3   Title  Patient will report being able to work with BUE for at least 30 minutes without symptoms to improve his abilty to complete work tasks and be able to complete responsibilities uninterrupted.    Baseline  15 minutes (04/24/2019);    Time  6    Period  Weeks    Status  New    Target Date  06/05/19      PT LONG TERM GOAL #4   Title  Have full lumbar AROM with no compensations or increase in symptoms in all  planes except intermittent end range discomfort to allow patient to complete valued activities with less difficulty    Baseline  limited the most in flexion - see objective exam (04/24/2019);    Time  6    Period  Weeks    Status  New    Target Date  06/05/19      PT LONG TERM GOAL #5   Title  Patient will demonstrate B hip  extension strength with flexed knees equal or greater than  4+/5 without symptoms to demonstrate functional strength for increased walking tolerance, reaching, lifting, carrying and increased ADL ability.    Baseline  4/5 bilaterally and painful on left (04/24/2019);    Time  6    Period  Weeks    Status  New    Target Date  06/05/19            Plan - 05/09/19 1910    Clinical Impression Statement  Patient tolerated treatment well overall but was limited by some cramping sensation in the right hip adductors following double knee to chest exercise that he reports usually happens to him at night and prevented him from lying down for manual therapy for a while. He was able to progress exercises for the glutes/hips/LE and thoracic spine. He did fell the left axillary pain some during more metabolically demanding exercises, such as step ups and it improved with rest. Felt increased left glute tiredness with standing hip abduction. Patient would benefit from continued management of limiting condition by skilled physical therapist to address remaining  impairments and functional limitations to work towards stated goals and return to PLOF or maximal functional independence.    Personal Factors and Comorbidities  Comorbidity 1;Past/Current Experience;Time since onset of injury/illness/exacerbation;Social Background    Comorbidities  PAD, hyperlipidemia.    Examination-Activity Limitations  Lift;Carry   walking, using BUE for activities including lifting, carrying, stabilizing   Examination-Participation Restrictions  Other;Community Activity;Yard Work;Cleaning   work activites that require use of BUE; physical activity/exercise   Stability/Clinical Decision Making  Stable/Uncomplicated    Rehab Potential  Fair    PT Frequency  2x / week    PT Duration  6 weeks    PT Treatment/Interventions  ADLs/Self Care Home Management;Aquatic Therapy;Cryotherapy;Traction;Electrical Stimulation;Moist Heat;Therapeutic activities;Therapeutic exercise;Neuromuscular re-education;Patient/family education;Manual techniques;Passive range of motion;Dry needling;Spinal Manipulations;Joint Manipulations    PT Next Visit Plan  manual techniques, strengthening/motor control and stretching    PT Home Exercise Plan  Medbridge Access Code: [email or web address]    Consulted and Agree with Plan of Care  Patient       Patient will benefit from skilled therapeutic intervention in order to improve the following deficits and impairments:  Decreased endurance, Decreased mobility, Difficulty walking, Hypomobility, Increased muscle spasms, Impaired perceived functional ability, Decreased range of motion, Decreased activity tolerance, Decreased strength, Impaired flexibility, Impaired UE functional use, Pain, Postural dysfunction  Visit Diagnosis: Chronic left-sided low back pain with left-sided sciatica  Cervicalgia  Pain in thoracic spine  Difficulty in walking, not elsewhere classified  Muscle weakness (generalized)  Pain in left hip  Pain in left upper arm  Chronic left shoulder  pain     Problem List Patient Active Problem List   Diagnosis Date Noted  . Hyperlipidemia 04/04/2018  . Pain in limb 04/04/2018  . PAD (peripheral artery disease) (HCC) 04/04/2018    Luretha Murphy. Ilsa Iha, PT, DPT 05/09/19, 7:10 PM  [place] [facility] SPORTS MEDICINE [address], [place], [address]  Phone: [phone]   Fax:  [phone]  Name: Eric Guerrero MRN: [id number] Date of Birth: [birth year]

## 2019-05-14 ENCOUNTER — Encounter: Payer: Self-pay | Admitting: Physical Therapy

## 2019-05-14 ENCOUNTER — Other Ambulatory Visit: Payer: Self-pay

## 2019-05-14 ENCOUNTER — Ambulatory Visit: Payer: Medicare Other | Admitting: Physical Therapy

## 2019-05-14 DIAGNOSIS — M79622 Pain in left upper arm: Secondary | ICD-10-CM

## 2019-05-14 DIAGNOSIS — M25552 Pain in left hip: Secondary | ICD-10-CM

## 2019-05-14 DIAGNOSIS — R262 Difficulty in walking, not elsewhere classified: Secondary | ICD-10-CM

## 2019-05-14 DIAGNOSIS — M542 Cervicalgia: Secondary | ICD-10-CM

## 2019-05-14 DIAGNOSIS — M546 Pain in thoracic spine: Secondary | ICD-10-CM

## 2019-05-14 DIAGNOSIS — G8929 Other chronic pain: Secondary | ICD-10-CM

## 2019-05-14 DIAGNOSIS — M5442 Lumbago with sciatica, left side: Secondary | ICD-10-CM

## 2019-05-14 DIAGNOSIS — M6281 Muscle weakness (generalized): Secondary | ICD-10-CM

## 2019-05-14 NOTE — Therapy (Signed)
[place] [facility] [address], [place], [address] Phone: [phone]   Fax:  [phone]  Physical Therapy Treatment  Patient Details  Name: Eric Guerrero MRN: [id number] Date of Birth: [birth year] Referring Provider (PT): Ahmed S. Estil Daft, MD   Encounter Date: 05/14/2019  PT End of Session - 05/14/19 1529    Visit Number  7    Number of Visits  12    Date for PT Re-Evaluation  06/05/19    Authorization Type  UHC reporting period from 04/24/2019    Authorization - Visit Number  7    Authorization - Number of Visits  10    PT Start Time  1527    PT Stop Time  1557    PT Time Calculation (min)  30 min    Activity Tolerance  Patient tolerated treatment well    Behavior During Therapy  [facility] for tasks assessed/performed       Past Medical History:  Diagnosis Date  . Hyperlipidemia     History reviewed. No pertinent surgical history.  There were no vitals filed for this visit.  Subjective Assessment - 05/14/19 1526    Subjective  Patient reports he is feeling better in the left glute. He can walk about 10 minutes before he gets too tired in his glute. No change in the L axilla. HEP is going well.    Pertinent History  Patient is a 69 y.o. male who presents to outpatient physical therapy with a referral for medical diagnosis piriformis and upper back pain. This patient's chief complaints consist of tired feeling that comes on in left glute and left axillary region with prolonged walking or use of UEs leading to the following functional deficits: difficulty walking or completing continuous UE work - limited to 15 min of each before resting;  impairers quality of life. Relevant past medical history and comorbidities include possible PAD, hyperlipidemia. Denies hx of stroke, cancer, diabetes, lung/breathing problems, seizures, heart disease, hx of major trauma or spinal surgery.    Limitations  Walking;Lifting;House  hold activities;Standing   upper extremity work for over 15 min. Walking for over 5 min   How long can you walk comfortably?  10 minutes    Diagnostic tests  states PAD as a cause was ruled out    Patient Stated Goals  feel better walking good and have no more problems in the upper extremity    Currently in Pain?  No/denies    Pain Onset  More than a month ago       TREATMENT:  Therapeutic exercise:to centralize symptoms and improve ROM, strength, muscular endurance, and activity tolerance required for successful completion of functional activities. - hooklying LTR x 2 min(greentheraball) - double knees to chest with green theraball under feet, x 2 min - step ups to 12 inch step with light unilateral UE support, 2x10 each side with 2 min break in between (feels at left axilla slightly) - standing hip abduction (fire hydrant) with red band around distal thighs, BUE support, 2x10 each side.  - quadruped thoracic rotation x 10 each side and each position (hand behind head, then hand over lumbar spine). To improve thoracic mobility and strength. - quadruped donkey kicks, 2x10 each side no weight.   - kneeling thoracic extension/lat stretch rock backs with stick, x20 to improve thoracic mobility.  Pt required multimodal cuing for proper technique and to facilitate improved neuromuscular control, strength, range of motion, and functional ability resulting  in improved performance and form.Had a lot of difficulty controlling scapulae.  Manual therapy:to reduce pain and tissue tension, improve range of motion, neuromodulation, in order to promote improved ability to complete functional activities. - left hip mobilization grade III-IV with belt (patient hooklying), posteriolateral, caudal, and lateral to decrease tension and pain.   - PROM left figure 4 stretch with hip distraction using mobilization belt 3x30 seconds. - supine left glute IR mobilization with left leg in hooklying crossed  over right leg, clinician pushing at greater trochanter to stretch the deep external rotators x 30 seconds. Patient did not feel much difference with this intervention so discontinued  HOME EXERCISE PROGRAM Access Code: [email or web address] URL: [email or web address] Date: 05/02/2019  Prepared by: Rosita Kea   Exercises Seated Slump Nerve Glide - 15 reps - 2-3x daily Seated Piriformis Stretch - 3 reps - 30 seconds hold - 2x daily Prone Press Up - 10 reps - 1 second hold - 2x daily Sidelying Thoracic Rotation with Open Book - 20 reps - 1 hold - 2x daily Clam with Resistance - 3 sets - 10 reps - 1 every other day    PT Education - 05/14/19 1529    Education Details  Exercise purpose/form. Self management techniques.    Person(s) Educated  Patient    Methods  Explanation;Demonstration;Tactile cues;Verbal cues    Comprehension  Verbalized understanding;Returned demonstration;Verbal cues required;Tactile cues required;Need further instruction       PT Short Term Goals - 04/30/19 1906      PT SHORT TERM GOAL #1   Title  Be independent with initial home exercise program for self-management of symptoms.    Baseline  Initial HEP provided at IE (04/24/2019);    Time  2    Period  Weeks    Status  Achieved    Target Date  05/08/19        PT Long Term Goals - 04/24/19 1340      PT LONG TERM GOAL #1   Title  Be independent with a long-term home exercise program for self-management of symptoms.    Baseline  Initial HEP provided at IE (04/24/2019);    Time  0    Period  Weeks    Status  New    Target Date  06/05/19      PT LONG TERM GOAL #2   Title  Patient will be able to ambluate at least 30 minutes without lower quarter symptoms to improve his ability to walk for fitness.    Baseline  5 minutes (04/24/2019);    Time  6    Period  Weeks    Status  New    Target Date  06/05/19      PT LONG TERM GOAL #3   Title  Patient will report being able to work with BUE for  at least 30 minutes without symptoms to improve his abilty to complete work tasks and be able to complete responsibilities uninterrupted.    Baseline  15 minutes (04/24/2019);    Time  6    Period  Weeks    Status  New    Target Date  06/05/19      PT LONG TERM GOAL #4   Title  Have full lumbar AROM with no compensations or increase in symptoms in all planes except intermittent end range discomfort to allow patient to complete valued activities with less difficulty    Baseline  limited the most in flexion - see objective exam (04/24/2019);  Time  6    Period  Weeks    Status  New    Target Date  06/05/19      PT LONG TERM GOAL #5   Title  Patient will demonstrate B hip  extension strength with flexed knees equal or greater than  4+/5 without symptoms to demonstrate functional strength for increased walking tolerance, reaching, lifting, carrying and increased ADL ability.    Baseline  4/5 bilaterally and painful on left (04/24/2019);    Time  6    Period  Weeks    Status  New    Target Date  06/05/19            Plan - 05/14/19 1545    Clinical Impression Statement  Patient tolerated treatment well overall and continues to report improvements in left glute "tiredness" with walking. Reports no improvement in left axillary pain, but is able to tolerate exercises with sufficient rest between sets. Patient would benefit from continued management of limiting condition by skilled physical therapist to address remaining impairments and functional limitations to work towards stated goals and return to PLOF or maximal functional independence.    Personal Factors and Comorbidities  Comorbidity 1;Past/Current Experience;Time since onset of injury/illness/exacerbation;Social Background    Comorbidities  PAD, hyperlipidemia.    Examination-Activity Limitations  Lift;Carry   walking, using BUE for activities including lifting, carrying, stabilizing   Examination-Participation Restrictions   Other;Community Activity;Yard Work;Cleaning   work activites that require use of BUE; physical activity/exercise   Stability/Clinical Decision Making  Stable/Uncomplicated    Rehab Potential  Fair    PT Frequency  2x / week    PT Duration  6 weeks    PT Treatment/Interventions  ADLs/Self Care Home Management;Aquatic Therapy;Cryotherapy;Traction;Electrical Stimulation;Moist Heat;Therapeutic activities;Therapeutic exercise;Neuromuscular re-education;Patient/family education;Manual techniques;Passive range of motion;Dry needling;Spinal Manipulations;Joint Manipulations    PT Next Visit Plan  manual techniques, strengthening/motor control and stretching    PT Home Exercise Plan  Medbridge Access Code: [email or web address]    Consulted and Agree with Plan of Care  Patient       Patient will benefit from skilled therapeutic intervention in order to improve the following deficits and impairments:  Decreased endurance, Decreased mobility, Difficulty walking, Hypomobility, Increased muscle spasms, Impaired perceived functional ability, Decreased range of motion, Decreased activity tolerance, Decreased strength, Impaired flexibility, Impaired UE functional use, Pain, Postural dysfunction  Visit Diagnosis: Chronic left-sided low back pain with left-sided sciatica  Cervicalgia  Pain in thoracic spine  Difficulty in walking, not elsewhere classified  Muscle weakness (generalized)  Pain in left hip  Pain in left upper arm  Chronic left shoulder pain     Problem List Patient Active Problem List   Diagnosis Date Noted  . Hyperlipidemia 04/04/2018  . Pain in limb 04/04/2018  . PAD (peripheral artery disease) (HCC) 04/04/2018    Luretha Murphy. Ilsa Iha, PT, DPT 05/14/19, 3:54 PM   [facility] PHYS[facility] AND SPORTS MEDICINE [address], [place], [address] Phone: [phone]   Fax:  [phone]  Name: Eric Guerrero MRN: [id number] Date of Birth: 14-Aug-1950

## 2019-05-16 ENCOUNTER — Encounter: Payer: Self-pay | Admitting: Physical Therapy

## 2019-05-16 ENCOUNTER — Other Ambulatory Visit: Payer: Self-pay

## 2019-05-16 ENCOUNTER — Ambulatory Visit: Payer: Medicare Other | Admitting: Physical Therapy

## 2019-05-16 NOTE — Therapy (Signed)
[place] [facility] [address], [place], [address] Phone: [phone]   Fax:  [phone]  Patient Details  Name: Eric Guerrero MRN: [id number] Date of Birth: 09-May-1950 Referring Provider:  No ref. provider found  Encounter Date: 05/16/2019  Patient came in today to speak with physical therapist, stating he was walking since last treatment session and his glute region felt as bad as ever and he does not feel like he is improving. He also continues to get the pain in his L axillary region with exertion and he would like to discontinue physical therapy until he is able to see his cardiologist in March. Agreed to cancel future appointments until he sees the cardiologist and gets back with [place] if he continues to need physical therapy.   Luretha Murphy. Ilsa Iha, PT, DPT 05/16/19, 2:41 PM  [place] [facility] PHYSICAL AND SPORTS MEDICINE [address] [place], [address] Phone: [phone]   Fax:  [phone]

## 2019-05-21 ENCOUNTER — Ambulatory Visit: Payer: Medicare Other | Admitting: Physical Therapy

## 2019-05-23 ENCOUNTER — Ambulatory Visit: Payer: Medicare Other | Admitting: Physical Therapy

## 2020-01-21 ENCOUNTER — Ambulatory Visit
Payer: Medicare Other | Attending: Student in an Organized Health Care Education/Training Program | Admitting: Student in an Organized Health Care Education/Training Program

## 2020-01-21 ENCOUNTER — Encounter: Payer: Self-pay | Admitting: Student in an Organized Health Care Education/Training Program

## 2020-01-21 ENCOUNTER — Other Ambulatory Visit: Payer: Self-pay

## 2020-01-21 VITALS — BP 141/74 | HR 57 | Temp 98.8°F | Resp 18 | Ht 68.0 in | Wt 210.0 lb

## 2020-01-21 DIAGNOSIS — M12812 Other specific arthropathies, not elsewhere classified, left shoulder: Secondary | ICD-10-CM | POA: Diagnosis present

## 2020-01-21 DIAGNOSIS — M545 Low back pain, unspecified: Secondary | ICD-10-CM | POA: Diagnosis not present

## 2020-01-21 DIAGNOSIS — M533 Sacrococcygeal disorders, not elsewhere classified: Secondary | ICD-10-CM

## 2020-01-21 DIAGNOSIS — M75102 Unspecified rotator cuff tear or rupture of left shoulder, not specified as traumatic: Secondary | ICD-10-CM | POA: Diagnosis present

## 2020-01-21 DIAGNOSIS — G894 Chronic pain syndrome: Secondary | ICD-10-CM | POA: Diagnosis present

## 2020-01-21 DIAGNOSIS — M25512 Pain in left shoulder: Secondary | ICD-10-CM

## 2020-01-21 DIAGNOSIS — M25552 Pain in left hip: Secondary | ICD-10-CM | POA: Diagnosis not present

## 2020-01-21 DIAGNOSIS — G8929 Other chronic pain: Secondary | ICD-10-CM

## 2020-01-21 MED ORDER — TIZANIDINE HCL 4 MG PO TABS: 2.0000 mg | ORAL_TABLET | Freq: Two times a day (BID) | ORAL | 0 refills | Status: AC | PRN

## 2020-01-21 MED ORDER — GABAPENTIN 100 MG PO CAPS: ORAL_CAPSULE | ORAL | 0 refills | Status: DC

## 2020-01-21 NOTE — Progress Notes (Signed)
Patient: Eric Guerrero  Service Category: E/M  Provider: Gillis Santa, MD  DOB: 12-30-1950  DOS: 01/21/2020  Referring Provider: Jodi Marble, MD  MRN: [id number]  Setting: Ambulatory outpatient  PCP: Jodi Marble, MD  Type: New Patient  Specialty: Interventional Pain Management    Location: Office  Delivery: Face-to-face     Primary Reason(s) for Visit: Encounter for initial evaluation of one or more chronic problems (new to examiner) potentially causing chronic pain, and posing a threat to normal musculoskeletal function. (Level of risk: High) CC: Back Pain (left, lower) and Shoulder Pain (left)  HPI  Eric Guerrero is a 69 y.o. year old, male patient, who comes for the first time to our practice referred by Jodi Marble, MD for our initial evaluation of his chronic pain. He has Hyperlipidemia; Pain in limb; PAD (peripheral artery disease) ([place]); Chronic left-sided low back pain without sciatica; Chronic left hip pain; Chronic left SI joint pain; Chronic left shoulder pain; Left rotator cuff tear arthropathy; and Chronic pain syndrome on their problem list. Today he comes in for evaluation of his Back Pain (left, lower) and Shoulder Pain (left)  Pain Assessment: Location: Left, Lower Back (tired) Radiating: denies Onset: More than a month ago Duration: Chronic pain Quality: Tiring Severity: 6 /10 (subjective, self-reported pain score)  Effect on ADL: shoulder pain causes difficulty moving arm Timing: Intermittent Modifying factors: rest BP: (!) 141/74   HR: (!) 57  Onset and Duration: Present longer than 3 months Cause of pain: Unknown Severity: No change since onset and NAS-11 at its worse: 7./10 Timing: During activity or exercise Aggravating Factors: Walking Alleviating Factors: Resting Associated Problems: Night-time cramps and Depression Quality of Pain: Exhausting and Pressure-like Previous Examinations or Tests: CT scan, Nerve block and X-rays Previous Treatments:  Physical Therapy and Stretching exercises  Eric Guerrero is a very pleasant 69 year old 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  Of note this pain has been present for many years.  Eric Guerrero states that he used to be very physically active and was able to walk 4 to 5 miles a day.  Now he is having left low back pain that limits his ability to bear weight and walk for an extended period of time.  He states that he if he is not exerting himself, his pain is well managed.  He utilizes Celebrex in the morning.  He is also on Xarelto for cardiac disease.  Patient is also a diabetic.  He is participating in physical therapy which he does not find very helpful in managing his pain.  It has helped with his range of motion.  He is looking forward to getting his quality of life back so that he can participate in hobbies and activities of daily living.  Meds   Current Outpatient Medications:    celecoxib (CELEBREX) 200 MG capsule, Take 200 mg by mouth daily., Disp: , Rfl:    isosorbide mononitrate (IMDUR) 60 MG 24 hr tablet, Take 60 mg by mouth daily., Disp: , Rfl:    metFORMIN (GLUCOPHAGE) 500 MG tablet, Take by mouth in the morning and at bedtime., Disp: , Rfl:    metoprolol succinate (TOPROL-XL) 50 MG 24 hr tablet, Take 50 mg by mouth. Take with or immediately following a meal., Disp: , Rfl:    rivaroxaban (XARELTO) 2.5 MG TABS tablet, Take 2.5 mg by  mouth 2 (two) times daily., Disp: , Rfl:    rosuvastatin (CRESTOR) 10 MG tablet, Take 10 mg by mouth daily., Disp: , Rfl:    TERBINAFINE HCL PO, Take 250 mg by mouth daily., Disp: , Rfl:    gabapentin (NEURONTIN) 100 MG capsule, Take 1 capsule (100 mg total) by mouth at bedtime for 14 days, THEN 2 capsules (200 mg total) at bedtime for 14 days, THEN 3 capsules (300 mg total) at bedtime for 14 days., Disp: 84 capsule, Rfl: 0   tiZANidine (ZANAFLEX) 4  MG tablet, Take 0.5-1 tablets (2-4 mg total) by mouth 2 (two) times daily as needed for muscle spasms., Disp: 60 tablet, Rfl: 0  Imaging Review    ROS  Cardiovascular: heart trouble, high blood pressure, Takes Xarelto Pulmonary or Respiratory: Snoring  Neurological: No reported neurological signs or symptoms such as seizures, abnormal skin sensations, urinary and/or fecal incontinence, being born with an abnormal open spine and/or a tethered spinal cord Psychological-Psychiatric: Depressed Gastrointestinal: No reported gastrointestinal signs or symptoms such as vomiting or evacuating blood, reflux, heartburn, alternating episodes of diarrhea and constipation, inflamed or scarred liver, or pancreas or irrregular and/or infrequent bowel movements Genitourinary: No reported renal or genitourinary signs or symptoms such as difficulty voiding or producing urine, peeing blood, non-functioning kidney, kidney stones, difficulty emptying the bladder, difficulty controlling the flow of urine, or chronic kidney disease Hematological: No reported hematological signs or symptoms such as prolonged bleeding, low or poor functioning platelets, bruising or bleeding easily, hereditary bleeding problems, low energy levels due to low hemoglobin or being anemic Endocrine: No reported endocrine signs or symptoms such as high or low blood sugar, rapid heart rate due to high thyroid levels, obesity or weight gain due to slow thyroid or thyroid disease Rheumatologic: No reported rheumatological signs and symptoms such as fatigue, joint pain, tenderness, swelling, redness, heat, stiffness, decreased range of motion, with or without associated rash Musculoskeletal: Negative for myasthenia gravis, muscular dystrophy, multiple sclerosis or malignant hyperthermia Work History: Working full time  Allergies  Eric Guerrero has No Known Allergies.  Laboratory Chemistry Profile   Renal No results found for: BUN, CREATININE,  LABCREA, BCR, GFR, GFRAA, GFRNONAA, SPECGRAV, PHUR, PROTEINUR   Electrolytes No results found for: NA, K, CL, CALCIUM, MG, PHOS   Hepatic No results found for: AST, ALT, ALBUMIN, ALKPHOS, AMYLASE, LIPASE, AMMONIA   ID No results found for: LYMEIGGIGMAB, HIV, SARSCOV2NAA, STAPHAUREUS, MRSAPCR, HCVAB, PREGTESTUR, RMSFIGG, QFVRPH1IGG, QFVRPH2IGG, LYMEIGGIGMAB   Bone No results found for: VD25OH, [id number], [id number], [id number], 25OHVITD1, 25OHVITD2, 25OHVITD3, TESTOFREE, TESTOSTERONE   Endocrine No results found for: GLUCOSE, GLUCOSEU, HGBA1C, TSH, FREET4, TESTOFREE, TESTOSTERONE, SHBG, ESTRADIOL, ESTRADIOLPCT, ESTRADIOLFRE, LABPREG, ACTH, CRTSLPL, UCORFRPERLTR, UCORFRPERDAY, CORTISOLBASE, LABPREG   Neuropathy No results found for: VITAMINB12, FOLATE, HGBA1C, HIV   CNS No results found for: COLORCSF, APPEARCSF, RBCCOUNTCSF, WBCCSF, POLYSCSF, LYMPHSCSF, EOSCSF, PROTEINCSF, GLUCCSF, JCVIRUS, CSFOLI, IGGCSF, LABACHR, ACETBL, LABACHR, ACETBL   Inflammation (CRP: Acute   ESR: Chronic) No results found for: CRP, ESRSEDRATE, LATICACIDVEN   Rheumatology No results found for: RF, ANA, LABURIC, URICUR, LYMEIGGIGMAB, LYMEABIGMQN, HLAB27   Coagulation No results found for: INR, LABPROT, APTT, PLT, DDIMER, LABHEMA, VITAMINK1, AT3   Cardiovascular No results found for: BNP, CKTOTAL, CKMB, TROPONINI, HGB, HCT, LABVMA, EPIRU, EPINEPH24HUR, NOREPRU, NOREPI24HUR, DOPARU, DOPAM24HRUR   Screening No results found for: SARSCOV2NAA, COVIDSOURCE, STAPHAUREUS, MRSAPCR, HCVAB, HIV, PREGTESTUR   Cancer No results found for: CEA, CA125, LABCA2   Allergens No results found for: ALMOND, APPLE, ASPARAGUS, AVOCADO, BANANA,  BARLEY, BASIL, BAYLEAF, GREENBEAN, LIMABEAN, WHITEBEAN, BEEFIGE, REDBEET, BLUEBERRY, BROCCOLI, CABBAGE, MELON, CARROT, CASEIN, CASHEWNUT, CAULIFLOWER, CELERY     Note: Lab results reviewed.  [place]  Drug: Mr. Lumm  reports no history of drug use. Alcohol:  reports no history of alcohol  use. Tobacco:  reports that he has never smoked. He has never used smokeless tobacco. Medical:  has a past medical history of Hyperlipidemia. Family: family history is not on file.  History reviewed. No pertinent surgical history. Active Ambulatory Problems    Diagnosis Date Noted   Hyperlipidemia 04/04/2018   Pain in limb 04/04/2018   PAD (peripheral artery disease) ([place]) 04/04/2018   Chronic left-sided low back pain without sciatica 01/21/2020   Chronic left hip pain 01/21/2020   Chronic left SI joint pain 01/21/2020   Chronic left shoulder pain 01/21/2020   Left rotator cuff tear arthropathy 01/21/2020   Chronic pain syndrome 01/21/2020   Resolved Ambulatory Problems    Diagnosis Date Noted   No Resolved Ambulatory Problems   No Additional Past Medical History   Constitutional Exam  General appearance: Well nourished, well developed, and well hydrated. In no apparent acute distress Vitals:   01/21/20 1301  BP: (!) 141/74  Pulse: (!) 57  Resp: 18  Temp: 98.8 F (37.1 C)  TempSrc: Oral  SpO2: 100%  Weight: 210 lb (95.3 kg)  Height: 5' 8"  (1.727 m)   BMI Assessment: Estimated body mass index is 31.93 kg/m as calculated from the following:   Height as of this encounter: 5' 8"  (1.727 m).   Weight as of this encounter: 210 lb (95.3 kg).  BMI interpretation table: BMI level Category Range association with higher incidence of chronic pain  <18 kg/m2 Underweight   18.5-24.9 kg/m2 Ideal body weight   25-29.9 kg/m2 Overweight Increased incidence by 20%  30-34.9 kg/m2 Obese (Class I) Increased incidence by 68%  35-39.9 kg/m2 Severe obesity (Class II) Increased incidence by 136%  >40 kg/m2 Extreme obesity (Class III) Increased incidence by 254%   Patient's current BMI Ideal Body weight  Body mass index is 31.93 kg/m. Ideal body weight: 68.4 kg (150 lb 12.7 oz) Adjusted ideal body weight: 79.1 kg (174 lb 7.6 oz)   BMI Readings from Last 4 Encounters:  01/21/20  31.93 kg/m  04/05/18 31.84 kg/m  04/04/18 32.11 kg/m   Wt Readings from Last 4 Encounters:  01/21/20 210 lb (95.3 kg)  04/05/18 209 lb 6.4 oz (95 kg)  04/04/18 211 lb 3.2 oz (95.8 kg)    Psych/Mental status: Alert, oriented x 3 (person, place, & time)       Eyes: PERLA Respiratory: No evidence of acute respiratory distress  Cervical Spine Exam  Skin & Axial Inspection: No masses, redness, edema, swelling, or associated skin lesions Alignment: Symmetrical Functional ROM: Unrestricted ROM      Stability: No instability detected Muscle Tone/Strength: Functionally intact. No obvious neuro-muscular anomalies detected. Sensory (Neurological): Unimpaired Palpation: No palpable anomalies              Upper Extremity (UE) Exam    Side: Right upper extremity  Side: Left upper extremity  Skin & Extremity Inspection: Skin color, temperature, and hair growth are WNL. No peripheral edema or cyanosis. No masses, redness, swelling, asymmetry, or associated skin lesions. No contractures.  Skin & Extremity Inspection: Skin color, temperature, and hair growth are WNL. No peripheral edema or cyanosis. No masses, redness, swelling, asymmetry, or associated skin lesions. No contractures.  Functional ROM: Unrestricted ROM  Functional ROM: Pain restricted ROM for shoulder  Muscle Tone/Strength: Functionally intact. No obvious neuro-muscular anomalies detected.   Muscle Tone/Strength: Functionally intact. No obvious neuro-muscular anomalies detected.  Sensory (Neurological): Unimpaired          Sensory (Neurological): Musculoskeletal pain pattern affecting the shoulder  Palpation: No palpable anomalies              Palpation: No palpable anomalies              Provocative Test(s):  Phalen's test: deferred Tinel's test: deferred Apley's scratch test (touch opposite shoulder):  Action 1 (Across chest): deferred Action 2 (Overhead): deferred Action 3 (LB reach): deferred   Provocative Test(s):   Phalen's test: deferred Tinel's test: deferred Apley's scratch test (touch opposite shoulder):  Action 1 (Across chest): Decreased ROM Action 2 (Overhead): Decreased ROM Action 3 (LB reach): Decreased ROM    Thoracic Spine Area Exam  Skin & Axial Inspection: No masses, redness, or swelling Alignment: Symmetrical Functional ROM: Unrestricted ROM Stability: No instability detected Muscle Tone/Strength: Functionally intact. No obvious neuro-muscular anomalies detected. Sensory (Neurological): Unimpaired Muscle strength & Tone: No palpable anomalies  Lumbar Exam  Skin & Axial Inspection: No masses, redness, or swelling Alignment: Symmetrical Functional ROM: Pain restricted ROM affecting primarily the left Stability: No instability detected Muscle Tone/Strength: Functionally intact. No obvious neuro-muscular anomalies detected. Sensory (Neurological): Musculoskeletal pain pattern Palpation: No palpable anomalies       Provocative Tests: Hyperextension/rotation test: deferred today       Lumbar quadrant test (Kemp's test): deferred today       Lateral bending test: (+) due to pain. Patrick's Maneuver: deferred today                   FABER* test: (+) for left-sided S-I arthralgia and for left hip arthralgia S-I anterior distraction/compression test: (+)   S-I arthralgia/arthropathy S-I lateral compression test: deferred today         S-I Thigh-thrust test: deferred today         S-I Gaenslen's test: deferred today         *(Flexion, ABduction and External Rotation)  Gait & Posture Assessment  Ambulation: Unassisted Gait: Relatively normal for age and body habitus Posture: WNL   Lower Extremity Exam    Side: Right lower extremity  Side: Left lower extremity  Stability: No instability observed          Stability: No instability observed          Skin & Extremity Inspection: Skin color, temperature, and hair growth are WNL. No peripheral edema or cyanosis. No masses, redness,  swelling, asymmetry, or associated skin lesions. No contractures.  Skin & Extremity Inspection: Skin color, temperature, and hair growth are WNL. No peripheral edema or cyanosis. No masses, redness, swelling, asymmetry, or associated skin lesions. No contractures.  Functional ROM: Unrestricted ROM                  Functional ROM: Unrestricted ROM                  Muscle Tone/Strength: Functionally intact. No obvious neuro-muscular anomalies detected.  Muscle Tone/Strength: Functionally intact. No obvious neuro-muscular anomalies detected.  Sensory (Neurological): Unimpaired        Sensory (Neurological): Unimpaired        DTR: Patellar: deferred today Achilles: deferred today Plantar: deferred today  DTR: Patellar: deferred today Achilles: deferred today Plantar: deferred today  Palpation: No palpable anomalies  Palpation: No  palpable anomalies   Assessment  Primary Diagnosis & Pertinent Problem List: The primary encounter diagnosis was Chronic left-sided low back pain without sciatica. Diagnoses of Chronic left hip pain, Chronic left SI joint pain, Chronic left shoulder pain, Left rotator cuff tear arthropathy, and Chronic pain syndrome were also pertinent to this visit.  Visit Diagnosis (New problems to examiner): 1. Chronic left-sided low back pain without sciatica   2. Chronic left hip pain   3. Chronic left SI joint pain   4. Chronic left shoulder pain   5. Left rotator cuff tear arthropathy   6. Chronic pain syndrome    Plan of Care (Initial workup plan)  Note: Mr. Henery was reminded that as per protocol, today's visit has been an evaluation only. We have not taken over the patient's controlled substance management.  General Recommendations: The pain condition that the patient suffers from is best treated with a multidisciplinary approach that involves an increase in physical activity to prevent de-conditioning and worsening of the pain cycle, as well as psychological counseling  (formal and/or informal) to address the co-morbid psychological affects of pain. Treatment will often involve judicious use of pain medications and interventional procedures to decrease the pain, allowing the patient to participate in the physical activity that will ultimately produce long-lasting pain reductions. The goal of the multidisciplinary approach is to return the patient to a higher level of overall function and to restore their ability to perform activities of daily living.  1.  X-rays of left shoulder, left hip, bilateral SI joint, lumbar spine.  After reviewing results can discuss treatment plan with patient. 2.  Start gabapentin as below.  Start tizanidine as below. 3.  UDS in the event that we need to escalate analgesics.   Lab Orders     Compliance Drug Analysis, Ur  Imaging Orders     DG Lumbar Spine Complete W/Bend     DG HIP UNILAT W OR W/O PELVIS 2-3 VIEWS LEFT     DG Si Joints     DG Shoulder Left Pharmacotherapy (current): Medications ordered:  Meds ordered this encounter  Medications   gabapentin (NEURONTIN) 100 MG capsule    Sig: Take 1 capsule (100 mg total) by mouth at bedtime for 14 days, THEN 2 capsules (200 mg total) at bedtime for 14 days, THEN 3 capsules (300 mg total) at bedtime for 14 days.    Dispense:  84 capsule    Refill:  0   tiZANidine (ZANAFLEX) 4 MG tablet    Sig: Take 0.5-1 tablets (2-4 mg total) by mouth 2 (two) times daily as needed for muscle spasms.    Dispense:  60 tablet    Refill:  0    Do not place this medication, or any other prescription from our practice, on "Automatic Refill". Patient may have prescription filled one day early if pharmacy is closed on scheduled refill date.   Medications administered during this visit: Cohan Stipes had no medications administered during this visit.   Pharmacological management options:  Opioid Analgesics: The patient was informed that there is no guarantee that he would be a candidate for  opioid analgesics. The decision will be made following CDC guidelines. This decision will be based on the results of diagnostic studies, as well as Mr. Escue risk profile.   Membrane stabilizer: Start gabapentin as above Future considerations include Lyrica, Cymbalta  Muscle relaxant: As needed tizanidine as above.  Can consider Robaxin in future  NSAID: Avoid as patient is on  Xarelto  Other analgesic(s): To be determined at a later time   Interventional management options: Mr. Carreno was informed that there is no guarantee that he would be a candidate for interventional therapies. The decision will be based on the results of diagnostic studies, as well as Mr. Bennett risk profile.  Procedure(s) under consideration:  Pending imaging studies   Provider-requested follow-up: Return in about 4 weeks (around 02/18/2020) for Medication Management, After Imaging, in person.  Future Appointments  Date Time Provider [place]  02/27/2020  1:40 PM Gillis Santa, MD [facility] None    Note by: Gillis Santa, MD Date: 01/21/2020; Time: 2:20 PM

## 2020-01-21 NOTE — Progress Notes (Signed)
Safety precautions to be maintained throughout the outpatient stay will include: orient to surroundings, keep bed in low position, maintain call bell within reach at all times, provide assistance with transfer out of bed and ambulation.  

## 2020-01-25 LAB — COMPLIANCE DRUG ANALYSIS, UR

## 2020-02-13 ENCOUNTER — Ambulatory Visit
Admission: RE | Admit: 2020-02-13 | Discharge: 2020-02-13 | Disposition: A | Discharge status: Home / Self Care | Payer: Medicare Other | Attending: Student in an Organized Health Care Education/Training Program | Admitting: Student in an Organized Health Care Education/Training Program

## 2020-02-13 ENCOUNTER — Ambulatory Visit
Admission: RE | Admit: 2020-02-13 | Discharge: 2020-02-13 | Disposition: A | Discharge status: Home / Self Care | Payer: Medicare Other | Source: Ambulatory Visit | Attending: Student in an Organized Health Care Education/Training Program | Admitting: Student in an Organized Health Care Education/Training Program

## 2020-02-13 DIAGNOSIS — M533 Sacrococcygeal disorders, not elsewhere classified: Secondary | ICD-10-CM | POA: Diagnosis present

## 2020-02-13 DIAGNOSIS — G8929 Other chronic pain: Secondary | ICD-10-CM | POA: Diagnosis present

## 2020-02-13 DIAGNOSIS — M25512 Pain in left shoulder: Secondary | ICD-10-CM | POA: Insufficient documentation

## 2020-02-13 DIAGNOSIS — M25552 Pain in left hip: Secondary | ICD-10-CM

## 2020-02-13 DIAGNOSIS — M545 Low back pain, unspecified: Secondary | ICD-10-CM | POA: Diagnosis not present

## 2020-02-27 ENCOUNTER — Encounter: Payer: Self-pay | Admitting: Student in an Organized Health Care Education/Training Program

## 2020-02-27 ENCOUNTER — Other Ambulatory Visit: Payer: Self-pay

## 2020-02-27 ENCOUNTER — Ambulatory Visit
Payer: Medicare Other | Attending: Student in an Organized Health Care Education/Training Program | Admitting: Student in an Organized Health Care Education/Training Program

## 2020-02-27 VITALS — BP 144/70 | HR 62 | Temp 97.9°F | Resp 16 | Ht 68.0 in | Wt 210.0 lb

## 2020-02-27 DIAGNOSIS — G894 Chronic pain syndrome: Secondary | ICD-10-CM | POA: Diagnosis present

## 2020-02-27 DIAGNOSIS — M47816 Spondylosis without myelopathy or radiculopathy, lumbar region: Secondary | ICD-10-CM | POA: Insufficient documentation

## 2020-02-27 DIAGNOSIS — M47818 Spondylosis without myelopathy or radiculopathy, sacral and sacrococcygeal region: Secondary | ICD-10-CM | POA: Insufficient documentation

## 2020-02-27 MED ORDER — METHOCARBAMOL 500 MG PO TABS: 500.0000 mg | ORAL_TABLET | Freq: Two times a day (BID) | ORAL | 2 refills | Status: DC | PRN

## 2020-02-27 MED ORDER — GABAPENTIN 300 MG PO CAPS
300.0000 mg | ORAL_CAPSULE | Freq: Every day | ORAL | 2 refills | Status: DC
Start: 2020-02-27 — End: 2020-07-01

## 2020-02-27 NOTE — Patient Instructions (Addendum)
Recommend SI-joint and piriformis exercises, can google/ youtube "SI joint exercise" or "piriformis exercise"   Sacroiliac Joint Dysfunction  Sacroiliac joint dysfunction is a condition that causes inflammation on one or both sides of the sacroiliac (SI) joint. The SI joint connects the lower part of the spine (sacrum) with the two upper portions of the pelvis (ilium). This condition causes deep aching or burning pain in the low back. In some cases, the pain may also spread into one or both buttocks, hips, or thighs. What are the causes? This condition may be caused by:  Pregnancy. During pregnancy, extra stress is put on the SI joints because the pelvis widens.  Injury, such as: ? Injuries from car accidents. ? Sports-related injuries. ? Work-related injuries.  Having one leg that is shorter than the other.  Conditions that affect the joints, such as: ? Rheumatoid arthritis. ? Gout. ? Psoriatic arthritis. ? Joint infection (septic arthritis). Sometimes, the cause of SI joint dysfunction is not known. What are the signs or symptoms? Symptoms of this condition include:  Aching or burning pain in the lower back. The pain may also spread to other areas, such as: ? Buttocks. ? Groin. ? Thighs.  Muscle spasms in or around the painful areas.  Increased pain when standing, walking, running, stair climbing, bending, or lifting. How is this diagnosed? This condition is diagnosed with a physical exam and medical history. During the exam, the health care provider may move one or both of your legs to different positions to check for pain. Various tests may be done to confirm the diagnosis, including:  Imaging tests to look for other causes of pain. These may include: ? MRI. ? CT scan. ? Bone scan.  Diagnostic injection. A numbing medicine is injected into the SI joint using a needle. If your pain is temporarily improved or stopped after the injection, this can indicate that SI joint  dysfunction is the problem. How is this treated? Treatment depends on the cause and severity of your condition. Treatment options may include:  Ice or heat applied to the lower back area after an injury. This may help reduce pain and muscle spasms.  Medicines to relieve pain or inflammation or to relax the muscles.  Wearing a back brace (sacroiliac brace) to help support the joint while your back is healing.  Physical therapy to increase muscle strength around the joint and flexibility at the joint. This may also involve learning proper body positions and ways of moving to relieve stress on the joint.  Direct manipulation of the SI joint.  Injections of steroid medicine into the joint to reduce pain and swelling.  Radiofrequency ablation to burn away nerves that are carrying pain messages from the joint.  Use of a device that provides electrical stimulation to help reduce pain at the joint.  Surgery to put in screws and plates that limit or prevent joint motion. This is rare. Follow these instructions at home: Medicines  Take over-the-counter and prescription medicines only as told by your health care provider.  Do not drive or use heavy machinery while taking prescription pain medicine.  If you are taking prescription pain medicine, take actions to prevent or treat constipation. Your health care provider may recommend that you: ? Drink enough fluid to keep your urine pale yellow. ? Eat foods that are high in fiber, such as fresh fruits and vegetables, whole grains, and beans. ? Limit foods that are high in fat and processed sugars, such as fried or sweet  foods. ? Take an over-the-counter or prescription medicine for constipation. If you have a brace:  Wear the brace as told by your health care provider. Remove it only as told by your health care provider.  Keep the brace clean.  If the brace is not waterproof: ? Do not let it get wet. ? Cover it with a watertight covering  when you take a bath or a shower. Managing pain, stiffness, and swelling      Icing can help with pain and swelling. Heat may help with muscle tension or spasms. Ask your health care provider if you should use ice or heat.  If directed, put ice on the affected area: ? If you have a removable brace, remove it as told by your health care provider. ? Put ice in a plastic bag. ? Place a towel between your skin and the bag. ? Leave the ice on for 20 minutes, 2-3 times a day.  If directed, apply heat to the affected area. Use the heat source that your health care provider recommends, such as a moist heat pack or a heating pad. ? Place a towel between your skin and the heat source. ? Leave the heat on for 20-30 minutes. ? Remove the heat if your skin turns bright red. This is especially important if you are unable to feel pain, heat, or cold. You may have a greater risk of getting burned. General instructions  Rest as needed. Ask your health care provider what activities are safe for you.  Return to your normal activities as told by your health care provider.  Exercise as directed by your health care provider or physical therapist.  Do not use any products that contain nicotine or tobacco, such as cigarettes and e-cigarettes. These can delay bone healing. If you need help quitting, ask your health care provider.  Keep all follow-up visits as told by your health care provider. This is important. Contact a health care provider if:  Your pain is not controlled with medicine.  You have a fever.  Your pain is getting worse. Get help right away if:  You have weakness, numbness, or tingling in your legs or feet.  You lose control of your bladder or bowel. Summary  Sacroiliac joint dysfunction is a condition that causes inflammation on one or both sides of the sacroiliac (SI) joint.  This condition causes deep aching or burning pain in the low back. In some cases, the pain may also  spread into one or both buttocks, hips, or thighs.  Treatment depends on the cause and severity of your condition. It may include medicines to reduce pain and swelling or to relax muscles. This information is not intended to replace advice given to you by your health care provider. Make sure you discuss any questions you have with your health care provider. Document Revised: 11/09/2017 Document Reviewed: 04/25/2017 Elsevier Patient Education  2020 [employer].

## 2020-02-27 NOTE — Progress Notes (Signed)
PROVIDER NOTE: Information contained herein reflects review and annotations entered in association with encounter. Interpretation of such information and data should be left to medically-trained personnel. Information provided to patient can be located elsewhere in the medical record under "Patient Instructions". Document created using STT-dictation technology, any transcriptional errors that may result from process are unintentional.    Patient: Eric Guerrero  Service Category: E/M  Provider: Edward Jolly, MD  DOB: 1950-09-18  DOS: 02/27/2020  Specialty: Interventional Pain Management  MRN: [id number]  Setting: Ambulatory outpatient  PCP: Sherron Monday, MD  Type: Established Patient    Referring Provider: Sherron Monday, MD  Location: Office  Delivery: Face-to-face     HPI  [name], a 69 y.o. year old male, is here today because of his SI joint arthritis [M47.818]. [name] primary complain today is Back Pain (lower, left side), Arm Pain (back of left arm), and Leg Pain (left thigh) Last encounter: My last encounter with him was on 01/21/2020. Pertinent problems: [name] has Hyperlipidemia; Pain in limb; PAD (peripheral artery disease) (HCC); Chronic left hip pain; Chronic left SI joint pain; Chronic left shoulder pain; Chronic pain syndrome; SI joint arthritis (left > right); and Lumbar facet arthropathy on their pertinent problem list. Pain Assessment: Severity of Chronic pain is reported as a 8 /10. Location: Back Lower, Left/NEW; in past 2 weeks, pain radiates to left thigh; also c/o/ left tricep pain. Onset: More than a month ago. Quality: Tiring. Timing: Intermittent. Modifying factor(s): resting, Aleve, gabapentin. Vitals:  height is  (1.727 m) and weight is 210 lb (95.3 kg). His temporal temperature is 97.9 F (36.6 C). His blood pressure is 144/70 (abnormal) and his pulse is 62. His respiration is 16 and oxygen saturation is 97%.   Reason for encounter: follow-up  evaluation   [name] follows up today for his second patient visit.  At his initial visit, he was started on gabapentin and instructed on its nightly.  Last week, he was able to make that dose increase to 300 mg nightly and is noticing approximately 20 to 25% pain relief in regards to his low back pain.  He attempted to trial tizanidine however this resulted in side effects of dry mouth so he stopped this medication.  Patient states that he used to be an avid runner when he was younger and is somewhat disappointed that he is not able to do as much anymore.  We discussed his shoulder, lumbar spine and SI joint x-rays today.  His SI joints show mild arthropathy he also has mild facet arthritis and degenerative changes in his lumbar spine most pronounced at L2-L3.  We discussed physical therapy exercises that he can do for his piriformis, SI joint and low back.  He plans to incorporate those into his exercise regimen.  I have instructed him to continue his gabapentin 300 mg nightly and to start Robaxin (discontinue tizanidine).  Follow-up in 3 months.  If symptoms are not better at that time can consider diagnostic sacroiliac joint and piriformis injection.  Patient endorsed understanding.  ROS  Constitutional: Denies any fever or chills Gastrointestinal: No reported hemesis, hematochezia, vomiting, or acute GI distress Musculoskeletal: Left shoulder, low back, left hip pain Neurological: No reported episodes of acute onset apraxia, aphasia, dysarthria, agnosia, amnesia, paralysis, loss of coordination, or loss of consciousness  Medication Review  Terbinafine HCl, celecoxib, gabapentin, isosorbide mononitrate, metFORMIN, methocarbamol, metoprolol succinate, rivaroxaban, and rosuvastatin  History Review  Allergy: [name] has No  Known Allergies. Drug: [name]  reports no history of drug use. Alcohol:  reports no history of alcohol use. Tobacco:  reports that he has never smoked. He has never used  smokeless tobacco. Social: [name]  reports that he has never smoked. He has never used smokeless tobacco. He reports that he does not drink alcohol and does not use drugs. Medical:  has a past medical history of Hyperlipidemia. Surgical: [name]  has no past surgical history on file. Family: family history is not on file.    Recent Imaging Review  DG Shoulder Left CLINICAL DATA:  Left shoulder pain.  No known injury.  EXAM: LEFT SHOULDER - 2+ VIEW  COMPARISON:  None.  FINDINGS: There is no evidence of fracture or dislocation. There is no evidence of arthropathy or other focal bone abnormality. Soft tissues are unremarkable.  IMPRESSION: Normal examination.  Electronically Signed   By: Beckie Salts M.D.   On: 02/14/2020 14:46 DG Si Joints CLINICAL DATA:  Posterior left hip pain for the past 2 years. No known injury.  EXAM: BILATERAL SACROILIAC JOINTS - 3+ VIEW  COMPARISON:  Left hip radiographs obtained at the same time.  FINDINGS: Mild sclerosis on both sides of the left sacroiliac joint and minimal sclerosis on the iliac side of the right sacroiliac joint. Lower lumbar spine degenerative changes.  IMPRESSION: 1. Mild left and minimal right sacroiliitis. 2. Lower lumbar spine degenerative changes.  Electronically Signed   By: Beckie Salts M.D.   On: 02/14/2020 14:45 DG HIP UNILAT W OR W/O PELVIS 2-3 VIEWS LEFT CLINICAL DATA:  Posterior left hip pain for the past 2 years. No known injury.  EXAM: DG HIP (WITH OR WITHOUT PELVIS) 2-3V LEFT  COMPARISON:  None.  FINDINGS: Normal appearing left hip. Mild sclerosis on both sides of the sacroiliac joints bilaterally. Lower lumbar spine degenerative changes and mild scoliosis.  IMPRESSION: 1. Normal appearing left hip. 2. Mild bilateral sacroiliitis. 3. Lower lumbar spine degenerative changes.  Electronically Signed   By: Beckie Salts M.D.   On: 02/14/2020 14:44 DG Lumbar Spine Complete W/Bend CLINICAL  DATA:  Low back and posterior left hip pain for 2 years.  EXAM: LUMBAR SPINE - COMPLETE WITH BENDING VIEWS  COMPARISON:  None.  FINDINGS: There are 5 non rib-bearing lumbar type vertebrae. There is mild upper lumbar dextroscoliosis. There is trace retrolisthesis of L1 on L2 and L2 on L3, and there is trace anterolisthesis of L4 on L5. No significant change is seen with flexion or extension. There may be a left-sided pars defect at L5 with evaluation for a right-sided defect limited by under penetration on the corresponding oblique radiograph. There is no significant listhesis at L5-S1.  Vertebral body heights are preserved. Moderate endplate spurring and mild-to-moderate disc space narrowing are present at L2-3, and there is milder narrowing and spurring at other levels. Aortic atherosclerosis is noted.  IMPRESSION: 1. Mild-to-moderate lumbar disc degeneration, greatest at L2-3. 2. Mild multilevel degenerative listhesis without evidence of dynamic instability.  Electronically Signed   By: Sebastian Ache M.D.   On: 02/14/2020 14:38 Note: Reviewed        Physical Exam  General appearance: Well nourished, well developed, and well hydrated. In no apparent acute distress Mental status: Alert, oriented x 3 (person, place, & time)       Respiratory: No evidence of acute respiratory distress Eyes: PERLA Vitals: BP (!) 144/70   Pulse 62   Temp 97.9 F (36.6 C) (Temporal)  Resp 16   Ht 5\' 8"  (1.727 m)   Wt 210 lb (95.3 kg)   SpO2 97%   BMI 31.93 kg/m  BMI: Estimated body mass index is 31.93 kg/m as calculated from the following:   Height as of this encounter: 5\' 8"  (1.727 m).   Weight as of this encounter: 210 lb (95.3 kg). Ideal: Ideal body weight: 68.4 kg (150 lb 12.7 oz) Adjusted ideal body weight: 79.1 kg (174 lb 7.6 oz)   Cervical Spine Exam  Skin & Axial Inspection: No masses, redness, edema, swelling, or associated skin lesions Alignment: Symmetrical Functional  ROM: Unrestricted ROM      Stability: No instability detected Muscle Tone/Strength: Functionally intact. No obvious neuro-muscular anomalies detected. Sensory (Neurological): Unimpaired Palpation: No palpable anomalies                    Upper Extremity (UE) Exam    Side: Right upper extremity  Side: Left upper extremity   Skin & Extremity Inspection: Skin color, temperature, and hair growth are WNL. No peripheral edema or cyanosis. No masses, redness, swelling, asymmetry, or associated skin lesions. No contractures.  Skin & Extremity Inspection: Skin color, temperature, and hair growth are WNL. No peripheral edema or cyanosis. No masses, redness, swelling, asymmetry, or associated skin lesions. No contractures.   Functional ROM: Unrestricted ROM          Functional ROM: Pain restricted ROM for shoulder   Muscle Tone/Strength: Functionally intact. No obvious neuro-muscular anomalies detected.   Muscle Tone/Strength: Functionally intact. No obvious neuro-muscular anomalies detected.   Sensory (Neurological): Unimpaired          Sensory (Neurological): Musculoskeletal pain pattern affecting the shoulder   Palpation: No palpable anomalies              Palpation: No palpable anomalies               Provocative Test(s):  Phalen's test: deferred Tinel's test: deferred Apley's scratch test (touch opposite shoulder):  Action 1 (Across chest): deferred Action 2 (Overhead): deferred Action 3 (LB reach): deferred   Provocative Test(s):  Phalen's test: deferred Tinel's test: deferred Apley's scratch test (touch opposite shoulder):  Action 1 (Across chest): Decreased ROM Action 2 (Overhead): Decreased ROM Action 3 (LB reach): Decreased ROM     Thoracic Spine Area Exam  Skin & Axial Inspection: No masses, redness, or swelling Alignment: Symmetrical Functional ROM: Unrestricted ROM Stability: No instability detected Muscle Tone/Strength: Functionally intact. No obvious  neuro-muscular anomalies detected. Sensory (Neurological): Unimpaired Muscle strength & Tone: No palpable anomalies  Lumbar Exam  Skin & Axial Inspection: No masses, redness, or swelling Alignment: Symmetrical Functional ROM: Pain restricted ROM affecting primarily the left Stability: No instability detected Muscle Tone/Strength: Functionally intact. No obvious neuro-muscular anomalies detected. Sensory (Neurological): Musculoskeletal pain pattern Palpation: No palpable anomalies       Provocative Tests: Hyperextension/rotation test: deferred today       Lumbar quadrant test (Kemp's test): deferred today       Lateral bending test: (+) due to pain. Patrick's Maneuver: deferred today                   FABER* test: (+) for left-sided S-I arthralgia and for left hip arthralgia S-I anterior distraction/compression test: (+)   S-I arthralgia/arthropathy S-I lateral compression test: deferred today         S-I Thigh-thrust test: deferred today         S-I Gaenslen's test: deferred  today         *(Flexion, ABduction and External Rotation)  Gait & Posture Assessment  Ambulation: Unassisted Gait: Relatively normal for age and body habitus Posture: WNL   Lower Extremity Exam    Side: Right lower extremity  Side: Left lower extremity  Stability: No instability observed          Stability: No instability observed          Skin & Extremity Inspection: Skin color, temperature, and hair growth are WNL. No peripheral edema or cyanosis. No masses, redness, swelling, asymmetry, or associated skin lesions. No contractures.  Skin & Extremity Inspection: Skin color, temperature, and hair growth are WNL. No peripheral edema or cyanosis. No masses, redness, swelling, asymmetry, or associated skin lesions. No contractures.  Functional ROM: Unrestricted ROM                  Functional ROM: Unrestricted ROM                  Muscle Tone/Strength: Functionally intact. No obvious neuro-muscular  anomalies detected.  Muscle Tone/Strength: Functionally intact. No obvious neuro-muscular anomalies detected.  Sensory (Neurological): Unimpaired        Sensory (Neurological): Unimpaired        DTR: Patellar: deferred today Achilles: deferred today Plantar: deferred today  DTR: Patellar: deferred today Achilles: deferred today Plantar: deferred today  Palpation: No palpable anomalies  Palpation: No palpable anomalies     Assessment   Status Diagnosis  Persistent Persistent Controlled 1. SI joint arthritis (left > right)   2. Lumbar facet arthropathy   3. Chronic pain syndrome      Updated Problems: Problem  SI joint arthritis (left > right)  Lumbar Facet Arthropathy  Chronic Left Hip Pain  Chronic Left Si Joint Pain  Chronic Left Shoulder Pain  Chronic Pain Syndrome  Hyperlipidemia  Pain in Limb  Pad (Peripheral Artery Disease) (Hcc)    Plan of Care  Mr. Dre Gamino has a current medication list which includes the following long-term medication(s): isosorbide mononitrate, metformin, metoprolol succinate, rivaroxaban, rosuvastatin, and gabapentin.  Pharmacotherapy (Medications Ordered): Meds ordered this encounter  Medications  . gabapentin (NEURONTIN) 300 MG capsule    Sig: Take 1 capsule (300 mg total) by mouth at bedtime.    Dispense:  30 capsule    Refill:  2  . methocarbamol (ROBAXIN) 500 MG tablet    Sig: Take 1 tablet (500 mg total) by mouth every 12 (twelve) hours as needed for muscle spasms.    Dispense:  60 tablet    Refill:  2    Do not place this medication, or any other prescription from our practice, on "Automatic Refill". Patient may have prescription filled one day early if pharmacy is closed on scheduled refill date.   Discuss physical therapy and stretching exercises to help with his low back and SI joint related pain  Follow-up plan:   Return in about 3 months (around 05/27/2020) for Medication Management, in person.   Recent Visits Date  Type Provider Dept  01/21/20 Office Visit Edward Jolly, MD [facility]  Showing recent visits within past 90 days and meeting all other requirements Today's Visits Date Type Provider Dept  02/27/20 Office Visit Edward Jolly, MD [facility]  Showing today's visits and meeting all other requirements Future Appointments Date Type Provider Dept  05/27/20 Appointment Edward Jolly, MD [facility]  Showing future appointments within next 90 days and meeting all other  requirements  I discussed the assessment and treatment plan with the patient. The patient was provided an opportunity to ask questions and all were answered. The patient agreed with the plan and demonstrated an understanding of the instructions.  Patient advised to call back or seek an in-person evaluation if the symptoms or condition worsens.  Duration of encounter: 30 minutes.  Note by: Edward JollyBilal Tayveon Lombardo, MD Date: 02/27/2020; Time: 2:53 PM

## 2020-02-27 NOTE — Progress Notes (Signed)
Safety precautions to be maintained throughout the outpatient stay will include: orient to surroundings, keep bed in low position, maintain call bell within reach at all times, provide assistance with transfer out of bed and ambulation.  

## 2020-05-01 ENCOUNTER — Other Ambulatory Visit: Payer: Self-pay

## 2020-05-01 ENCOUNTER — Ambulatory Visit (INDEPENDENT_AMBULATORY_CARE_PROVIDER_SITE_OTHER): Payer: Medicare Other | Admitting: Podiatry

## 2020-05-01 ENCOUNTER — Encounter: Payer: Self-pay | Admitting: Podiatry

## 2020-05-01 DIAGNOSIS — Z79899 Other long term (current) drug therapy: Secondary | ICD-10-CM | POA: Diagnosis not present

## 2020-05-01 DIAGNOSIS — B351 Tinea unguium: Secondary | ICD-10-CM

## 2020-05-01 NOTE — Progress Notes (Signed)
Subjective:  Patient ID: Eric Guerrero, male    DOB: Mar 18, 1951,  MRN: [id number]  Chief Complaint  Patient presents with  . Nail Problem    Patient presents today for nail fungus most toes bilat x years  He is currently taking Lamisil given by his pcp, but he says its not working    70 y.o. male presents with the above complaint.  Patient presents with complaint of thickened elongated dystrophic mycotic bilateral hallux to bilateral feet.  Patient states that he has not done anything for the fungus nails.  He would like to discuss treatment options for it.  He has not seen anyone else prior to see me.  He is try some over-the-counter application which has not helped.  He denies any other acute complaints he has been noticing this for past 3 years.  Mild pain on palpation.   Review of Systems: Negative except as noted in the HPI. Denies N/V/F/Ch.  Past Medical History:  Diagnosis Date  . Hyperlipidemia     Current Outpatient Medications:  .  celecoxib (CELEBREX) 200 MG capsule, Take 200 mg by mouth daily., Disp: , Rfl:  .  gabapentin (NEURONTIN) 300 MG capsule, Take 1 capsule (300 mg total) by mouth at bedtime., Disp: 30 capsule, Rfl: 2 .  isosorbide mononitrate (IMDUR) 60 MG 24 hr tablet, Take 60 mg by mouth daily., Disp: , Rfl:  .  lisinopril-hydrochlorothiazide (ZESTORETIC) 20-25 MG tablet, Take 1 tablet by mouth daily., Disp: , Rfl:  .  metFORMIN (GLUCOPHAGE) 500 MG tablet, Take by mouth in the morning and at bedtime., Disp: , Rfl:  .  methocarbamol (ROBAXIN) 500 MG tablet, Take 1 tablet (500 mg total) by mouth every 12 (twelve) hours as needed for muscle spasms., Disp: 60 tablet, Rfl: 2 .  metoprolol succinate (TOPROL-XL) 50 MG 24 hr tablet, Take 50 mg by mouth. Take with or immediately following a meal., Disp: , Rfl:  .  pantoprazole (PROTONIX) 40 MG tablet, Take 40 mg by mouth daily., Disp: , Rfl:  .  rivaroxaban (XARELTO) 2.5 MG TABS tablet, Take 2.5 mg by mouth 2 (two) times  daily., Disp: , Rfl:  .  rosuvastatin (CRESTOR) 10 MG tablet, Take 10 mg by mouth daily., Disp: , Rfl:  .  terbinafine (LAMISIL) 250 MG tablet, Take 250 mg by mouth daily., Disp: , Rfl:   Social History   Tobacco Use  Smoking Status Never Smoker  Smokeless Tobacco Never Used    No Known Allergies Objective:  There were no vitals filed for this visit. There is no height or weight on file to calculate BMI. Constitutional Well developed. Well nourished.  Vascular Dorsalis pedis pulses palpable bilaterally. Posterior tibial pulses palpable bilaterally. Capillary refill normal to all digits.  No cyanosis or clubbing noted. Pedal hair growth normal.  Neurologic Normal speech. Oriented to person, place, and time. Epicritic sensation to light touch grossly present bilaterally.  Dermatologic  thickened elongated dystrophic toenails to bilateral hallux.  Mycotic in nature.  Mild pain on palpation.  Orthopedic: Normal joint ROM without pain or crepitus bilaterally. No visible deformities. No bony tenderness.   Radiographs: None Assessment:   1. Encounter for long-term (current) use of medications    Plan:  Patient was evaluated and treated and all questions answered.  Bilateral hallux onychomycosis -Educated the patient on the etiology of onychomycosis and various treatment options associated with improving the fungal load.  I explained to the patient that there is 3 treatment options available to treat  the onychomycosis including topical, p.o., laser treatment.  Patient elected to undergo p.o. options with Lamisil/terbinafine therapy.  In order for me to start the medication therapy, I explained to the patient the importance of evaluating the liver and obtaining the liver function test.  Once the liver function test comes back normal I will start him on 74-month course of Lamisil therapy.  Patient understood all risk and would like to proceed with Lamisil therapy.  I have asked the  patient to immediately stop the Lamisil therapy if she has any reactions to it and call the office or go to the emergency room right away.  Patient states understanding   No follow-ups on file.

## 2020-05-02 ENCOUNTER — Other Ambulatory Visit: Payer: Self-pay | Admitting: Podiatry

## 2020-05-02 LAB — HEPATIC FUNCTION PANEL
ALT: 24 IU/L (ref 0–44)
AST: 20 IU/L (ref 0–40)
Albumin: 4.2 g/dL (ref 3.8–4.8)
Alkaline Phosphatase: 108 IU/L (ref 44–121)
Bilirubin Total: 0.2 mg/dL (ref 0.0–1.2)
Bilirubin, Direct: <0.1 mg/dL (ref 0.00–0.40)
Total Protein: 7.1 g/dL (ref 6.0–8.5)

## 2020-05-02 MED ORDER — TERBINAFINE HCL 250 MG PO TABS: 250.0000 mg | ORAL_TABLET | Freq: Every day | ORAL | 0 refills | Status: DC

## 2020-05-27 ENCOUNTER — Encounter: Payer: Self-pay | Admitting: Student in an Organized Health Care Education/Training Program

## 2020-05-27 ENCOUNTER — Ambulatory Visit
Payer: Medicare Other | Attending: Student in an Organized Health Care Education/Training Program | Admitting: Student in an Organized Health Care Education/Training Program

## 2020-05-27 ENCOUNTER — Other Ambulatory Visit: Payer: Self-pay

## 2020-05-27 VITALS — BP 159/70 | HR 71 | Temp 97.2°F | Ht 68.0 in | Wt 212.0 lb

## 2020-05-27 DIAGNOSIS — G8929 Other chronic pain: Secondary | ICD-10-CM | POA: Diagnosis present

## 2020-05-27 DIAGNOSIS — M533 Sacrococcygeal disorders, not elsewhere classified: Secondary | ICD-10-CM | POA: Insufficient documentation

## 2020-05-27 DIAGNOSIS — M47818 Spondylosis without myelopathy or radiculopathy, sacral and sacrococcygeal region: Secondary | ICD-10-CM | POA: Insufficient documentation

## 2020-05-27 DIAGNOSIS — G5702 Lesion of sciatic nerve, left lower limb: Secondary | ICD-10-CM | POA: Insufficient documentation

## 2020-05-27 DIAGNOSIS — G894 Chronic pain syndrome: Secondary | ICD-10-CM | POA: Insufficient documentation

## 2020-05-27 NOTE — Progress Notes (Signed)
Patient: Eric Guerrero  Service Category: E/M  Provider: Gillis Santa, MD  DOB: [birth year]  DOS: 05/27/2020  Specialty: Interventional Pain Management  MRN: [id number]  Setting: Ambulatory outpatient  PCP: Eric Marble, MD  Type: Established Patient    Referring Provider: Jodi Marble, MD  Location: Office  Delivery: Face-to-face     HPI  [name], a 70 y.o. year old male, is here today because of his SI joint arthritis [M47.818]. [name] primary complain today is Back Pain (Back pain) Last encounter: My last encounter with him was on 02/27/2020. Pertinent problems: [name] has Hyperlipidemia; Pain in limb; PAD (peripheral artery disease) ([name]); Chronic left hip pain; Chronic left SI joint pain; Chronic left shoulder pain; Chronic pain syndrome; SI joint arthritis (left > right); and Lumbar facet arthropathy on their pertinent problem list. Pain Assessment: Severity of Chronic pain is reported as a 7 /10. Location: Back (left arm and shoulder, upper chest on the left side while walking) Left,Lower/pain radiaties down to buttock. Onset: More than a month ago. Quality: Tiring,Aching. Timing: Intermittent. Modifying factor(s): sit down and stop walking. Vitals:  height is _0  (1.727 m) and weight is 212 lb (96.2 kg). His temperature is 97.2 F (36.2 C) (abnormal). His blood pressure is 159/70 (abnormal) and his pulse is 71. His oxygen saturation is 100%.   Reason for encounter: medication management.    Continues to have persistent low back, left SI joint pain.  No significant change in his medical history since his last visit with me.  States that the Robaxin is helping him sleep but is not really impacting his chronic left buttock pain.  He is also having left shoulder pain that is worse with shoulder abduction.  As the pain gets worse during the day, it radiates more anteriorly towards his lateral pectoralis on the left.  He states that his body pain is worse when he is  sitting down and slightly better when he is walking.  Does have positive Patrick's on the left as well as additional positive SI provocative physical exam findings.  Discussed left sacroiliac joint injection, diagnostic as well as left piriformis injection, diagnostic.  Risks and benefits reviewed and patient like to proceed.  If these are not helpful, recommend lumbar MRI.  For the patient's persistent left shoulder pain in the context of a negative shoulder x-ray, discussed further work-up via left shoulder MRI to evaluate any ligamentous injury that could be contributing to his pain.  Patient states that if shoulder pain does not improve over the next month, he would like to pursue left shoulder MRI.   ROS  Constitutional: Denies any fever or chills Gastrointestinal: No reported hemesis, hematochezia, vomiting, or acute GI distress Musculoskeletal: Denies any acute onset joint swelling, redness, loss of ROM, or weakness Neurological: No reported episodes of acute onset apraxia, aphasia, dysarthria, agnosia, amnesia, paralysis, loss of coordination, or loss of consciousness  Medication Review  celecoxib, gabapentin, isosorbide mononitrate, lisinopril-hydrochlorothiazide, metFORMIN, methocarbamol, metoprolol succinate, pantoprazole, rivaroxaban, rosuvastatin, and terbinafine  History Review  Allergy: [name] has No Known Allergies. Drug: [name]  reports no history of drug use. Alcohol:  reports no history of alcohol use. Tobacco:  reports that he has never smoked. He has never used smokeless tobacco. Social: Eric Guerrero  reports that he has never smoked. He has never used smokeless tobacco. He reports that he does not drink alcohol and does not use drugs. Medical:  has a past medical history of  Hyperlipidemia. Surgical: [name]  has no past surgical history on file. Family: family history is not on file.  Laboratory Chemistry Profile   Renal No results found for: BUN, CREATININE,  LABCREA, BCR, GFR, GFRAA, GFRNONAA, LABVMA, EPIRU, EPINEPH24HUR, NOREPRU, NOREPI24HUR, DOPARU, [id number]   Hepatic Lab Results  Component Value Date   AST 20 05/01/2020   ALT 24 05/01/2020   ALBUMIN 4.2 05/01/2020   ALKPHOS 108 05/01/2020     Electrolytes No results found for: NA, K, CL, CALCIUM, MG, PHOS   Bone No results found for: VD25OH, VD125OH2TOT, VV6160VP7, TG6269SW5, 25OHVITD1, 25OHVITD2, 25OHVITD3, TESTOFREE, TESTOSTERONE   Inflammation (CRP: Acute Phase) (ESR: Chronic Phase) No results found for: CRP, ESRSEDRATE, LATICACIDVEN     Note: Above Lab results reviewed.  Recent Imaging Review  DG Shoulder Left CLINICAL DATA:  Left shoulder pain.  No known injury.  EXAM: LEFT SHOULDER - 2+ VIEW  COMPARISON:  None.  FINDINGS: There is no evidence of fracture or dislocation. There is no evidence of arthropathy or other focal bone abnormality. Soft tissues are unremarkable.  IMPRESSION: Normal examination.  Electronically Signed   By: Claudie Revering M.D.   On: 02/14/2020 14:46 DG Si Joints CLINICAL DATA:  Posterior left hip pain for the past 2 years. No known injury.  EXAM: BILATERAL SACROILIAC JOINTS - 3+ VIEW  COMPARISON:  Left hip radiographs obtained at the same time.  FINDINGS: Mild sclerosis on both sides of the left sacroiliac joint and minimal sclerosis on the iliac side of the right sacroiliac joint. Lower lumbar spine degenerative changes.  IMPRESSION: 1. Mild left and minimal right sacroiliitis. 2. Lower lumbar spine degenerative changes.  Electronically Signed   By: Claudie Revering M.D.   On: 02/14/2020 14:45 DG HIP UNILAT W OR W/O PELVIS 2-3 VIEWS LEFT CLINICAL DATA:  Posterior left hip pain for the past 2 years. No known injury.  EXAM: DG HIP (WITH OR WITHOUT PELVIS) 2-3V LEFT  COMPARISON:  None.  FINDINGS: Normal appearing left hip. Mild sclerosis on both sides of the sacroiliac joints bilaterally. Lower lumbar spine  degenerative changes and mild scoliosis.  IMPRESSION: 1. Normal appearing left hip. 2. Mild bilateral sacroiliitis. 3. Lower lumbar spine degenerative changes.  Electronically Signed   By: Claudie Revering M.D.   On: 02/14/2020 14:44 DG Lumbar Spine Complete W/Bend CLINICAL DATA:  Low back and posterior left hip pain for 2 years.  EXAM: LUMBAR SPINE - COMPLETE WITH BENDING VIEWS  COMPARISON:  None.  FINDINGS: There are 5 non rib-bearing lumbar type vertebrae. There is mild upper lumbar dextroscoliosis. There is trace retrolisthesis of L1 on L2 and L2 on L3, and there is trace anterolisthesis of L4 on L5. No significant change is seen with flexion or extension. There may be a left-sided pars defect at L5 with evaluation for a right-sided defect limited by under penetration on the corresponding oblique radiograph. There is no significant listhesis at L5-S1.  Vertebral body heights are preserved. Moderate endplate spurring and mild-to-moderate disc space narrowing are present at L2-3, and there is milder narrowing and spurring at other levels. Aortic atherosclerosis is noted.  IMPRESSION: 1. Mild-to-moderate lumbar disc degeneration, greatest at L2-3. 2. Mild multilevel degenerative listhesis without evidence of dynamic instability.  Electronically Signed   By: Logan Bores M.D.   On: 02/14/2020 14:38 Note: Reviewed        Physical Exam  General appearance: Well nourished, well developed, and well hydrated. In no apparent acute distress Mental status: Alert, oriented  x 3 (person, place, & time)       Respiratory: No evidence of acute respiratory distress Eyes: PERLA Vitals: BP (!) 159/70   Pulse 71   Temp (!) 97.2 F (36.2 C)   Ht _0  (1.727 m)   Wt 212 lb (96.2 kg)   SpO2 100%   BMI 32.23 kg/m  BMI: Estimated body mass index is 32.23 kg/m as calculated from the following:   Height as of this encounter: _1  (1.727 m).   Weight as of this encounter: 212 lb  (96.2 kg). Ideal: Ideal body weight: 68.4 kg (150 lb 12.7 oz) Adjusted ideal body weight: 79.5 kg (175 lb 4.4 oz) Upper Extremity (UE) Exam    Side: Right upper extremity  Side: Left upper extremity  Skin & Extremity Inspection: Skin color, temperature, and hair growth are WNL. No peripheral edema or cyanosis. No masses, redness, swelling, asymmetry, or associated skin lesions. No contractures.  Skin & Extremity Inspection: Skin color, temperature, and hair growth are WNL. No peripheral edema or cyanosis. No masses, redness, swelling, asymmetry, or associated skin lesions. No contractures.  Functional ROM: Unrestricted ROM          Functional ROM: Pain restricted ROM for shoulder  Muscle Tone/Strength: Functionally intact. No obvious neuro-muscular anomalies detected.  Muscle Tone/Strength: Functionally intact. No obvious neuro-muscular anomalies detected.  Sensory (Neurological): Unimpaired          Sensory (Neurological): Musculoskeletal pain pattern          Palpation: No palpable anomalies              Palpation: No palpable anomalies              Provocative Test(s):  Phalen's test: deferred Tinel's test: deferred Apley's scratch test (touch opposite shoulder):  Action 1 (Across chest): deferred Action 2 (Overhead): deferred Action 3 (LB reach): deferred   Provocative Test(s):  Phalen's test: deferred Tinel's test: deferred Apley's scratch test (touch opposite shoulder):  Action 1 (Across chest): deferred Action 2 (Overhead): deferred Action 3 (LB reach): deferred    Lumbar Spine Area Exam  Skin & Axial Inspection: No masses, redness, or swelling Alignment: Symmetrical Functional ROM: Unrestricted ROM       Stability: No instability detected Muscle Tone/Strength: Functionally intact. No obvious neuro-muscular anomalies detected. Sensory (Neurological): Musculoskeletal  Hyperextension/rotation test: deferred today       Lumbar quadrant test (Kemp's test): deferred today        Lateral bending test: deferred today       Patrick's Maneuver: (+) for left-sided S-I arthralgia             FABER* test: (+) for left-sided S-I arthralgia             S-I anterior distraction/compression test: (+)   S-I arthralgia/arthropathy S-I lateral compression test: (+) Left-sided S-I arthralgia/arthropathy S-I Thigh-thrust test: deferred today         S-I Gaenslen's test: deferred today         *(Flexion, ABduction and External Rotation) Tender to palpation overlying left sacroiliac joint  Lower Extremity Exam    Side: Right lower extremity  Side: Left lower extremity  Stability: No instability observed          Stability: No instability observed          Skin & Extremity Inspection: Skin color, temperature, and hair growth are WNL. No peripheral edema or cyanosis. No masses, redness, swelling, asymmetry, or associated skin lesions. No contractures.  Skin & Extremity Inspection: Skin color, temperature, and hair growth are WNL. No peripheral edema or cyanosis. No masses, redness, swelling, asymmetry, or associated skin lesions. No contractures.  Functional ROM: Unrestricted ROM                  Functional ROM: Unrestricted ROM                  Muscle Tone/Strength: Functionally intact. No obvious neuro-muscular anomalies detected.  Muscle Tone/Strength: Functionally intact. No obvious neuro-muscular anomalies detected.  Sensory (Neurological): Unimpaired        Sensory (Neurological): Unimpaired        DTR: Patellar: deferred today Achilles: deferred today Plantar: deferred today  DTR: Patellar: deferred today Achilles: deferred today Plantar: deferred today    Assessment   Status Diagnosis  Persistent Persistent Persistent 1. SI joint arthritis (left > right)   2. Piriformis syndrome, left   3. Chronic left SI joint pain   4. Chronic pain syndrome      Updated Problems: Problem  Piriformis Syndrome, Left    Plan of Care  [name] has a current  medication list which includes the following long-term medication(s): gabapentin, isosorbide mononitrate, lisinopril-hydrochlorothiazide, metformin, metoprolol succinate, pantoprazole, rivaroxaban, and rosuvastatin.  Continue gabapentin nightly, Robaxin as needed.  Plan for left sacroiliac joint injection and left piriformis injection under fluoroscopy.  Will need cardiac clearance to stop Xarelto 3 days prior to scheduled procedure.  Patient states that he is seeing his cardiologist next week and will talk to him about it. Orders:  Orders Placed This Encounter  Procedures  . SACROILIAC JOINT INJECTION    Standing Status:   Future    Standing Expiration Date:   06/27/2020    Scheduling Instructions:     Side:LEFT     Timeframe: ASAP    Order Specific Question:   Where will this procedure be performed?    Answer:   [facility]  . TRIGGER POINT INJECTION    Area: Buttocks region (gluteal area) Indications: Piriformis muscle pain;  left piriformis-syndrome; piriformis muscle spasms (N27.782). CPT code: 20552    Scheduling Instructions:     Type: Myoneural block (TPI) of piriformis muscle.     Side:  left     Sedation: Patient's choice.     Timeframe: Today    Order Specific Question:   Where will this procedure be performed?    Answer:   [facility]   Patient instructed to stop Xarelto 3 days prior to scheduled procedure.  He states that he is meeting with Dr. Humphrey Rolls, his cardiologist next week and will try to get clearance to stop Xarelto 3 days prior.  Follow-up plan:   Return in about 2 weeks (around 06/10/2020) for Left SI-J + Left Piriformis , without sedation (stop Xarelto 3 days prior).    Recent Visits Date Type Provider Dept  02/27/20 Office Visit Gillis Santa, MD [facility]  Showing recent visits within past 90 days and meeting all other requirements Today's Visits Date Type Provider Dept  05/27/20 Office Visit Gillis Santa, MD [facility]  Showing today's visits and meeting all other requirements Future Appointments Date Type Provider Dept  06/09/20 Appointment Gillis Santa, MD [facility]  Showing future appointments within next 90 days and meeting all other requirements  I discussed the assessment and treatment plan with the patient. The patient was provided an opportunity to ask questions and all were answered. The patient  agreed with the plan and demonstrated an understanding of the instructions.  Patient advised to call back or seek an in-person evaluation if the symptoms or condition worsens.  Duration of encounter: 30 minutes.  Note by: Gillis Santa, MD Date: 05/27/2020; Time: 2:38 PM

## 2020-05-27 NOTE — Progress Notes (Signed)
Safety precautions to be maintained throughout the outpatient stay will include: orient to surroundings, keep bed in low position, maintain call bell within reach at all times, provide assistance with transfer out of bed and ambulation.  

## 2020-06-09 ENCOUNTER — Encounter: Payer: Self-pay | Admitting: Student in an Organized Health Care Education/Training Program

## 2020-06-09 ENCOUNTER — Ambulatory Visit
Admission: RE | Admit: 2020-06-09 | Discharge: 2020-06-09 | Disposition: A | Discharge status: Home / Self Care | Payer: Medicare Other | Source: Ambulatory Visit | Attending: Student in an Organized Health Care Education/Training Program | Admitting: Student in an Organized Health Care Education/Training Program

## 2020-06-09 ENCOUNTER — Ambulatory Visit (HOSPITAL_BASED_OUTPATIENT_CLINIC_OR_DEPARTMENT_OTHER): Payer: Medicare Other | Admitting: Student in an Organized Health Care Education/Training Program

## 2020-06-09 ENCOUNTER — Other Ambulatory Visit: Payer: Self-pay

## 2020-06-09 VITALS — BP 128/86 | HR 65 | Temp 97.0°F | Resp 21 | Ht 68.0 in | Wt 212.0 lb

## 2020-06-09 DIAGNOSIS — M549 Dorsalgia, unspecified: Secondary | ICD-10-CM | POA: Insufficient documentation

## 2020-06-09 DIAGNOSIS — G5702 Lesion of sciatic nerve, left lower limb: Secondary | ICD-10-CM

## 2020-06-09 DIAGNOSIS — M533 Sacrococcygeal disorders, not elsewhere classified: Secondary | ICD-10-CM | POA: Insufficient documentation

## 2020-06-09 DIAGNOSIS — G8929 Other chronic pain: Secondary | ICD-10-CM | POA: Diagnosis not present

## 2020-06-09 DIAGNOSIS — M47818 Spondylosis without myelopathy or radiculopathy, sacral and sacrococcygeal region: Secondary | ICD-10-CM | POA: Diagnosis not present

## 2020-06-09 DIAGNOSIS — M461 Sacroiliitis, not elsewhere classified: Secondary | ICD-10-CM

## 2020-06-09 DIAGNOSIS — G894 Chronic pain syndrome: Secondary | ICD-10-CM | POA: Insufficient documentation

## 2020-06-09 MED ORDER — METHYLPREDNISOLONE ACETATE 40 MG/ML IJ SUSP
INTRAMUSCULAR | Status: AC
  Filled 2020-06-09: qty 1

## 2020-06-09 MED ORDER — ROPIVACAINE HCL 2 MG/ML IJ SOLN
INTRAMUSCULAR | Status: AC
  Filled 2020-06-09: qty 10

## 2020-06-09 MED ORDER — METHYLPREDNISOLONE ACETATE 40 MG/ML IJ SUSP
40.0000 mg | Freq: Once | INTRAMUSCULAR | Status: AC
  Administered 2020-06-09: 40 mg via INTRA_ARTICULAR

## 2020-06-09 MED ORDER — TRIAMCINOLONE ACETONIDE 40 MG/ML IJ SUSP
INTRAMUSCULAR | Status: AC
  Filled 2020-06-09: qty 1

## 2020-06-09 MED ORDER — DEXAMETHASONE SODIUM PHOSPHATE 10 MG/ML IJ SOLN
10.0000 mg | Freq: Once | INTRAMUSCULAR | Status: AC
  Administered 2020-06-09: 10 mg

## 2020-06-09 MED ORDER — TRIAMCINOLONE ACETONIDE 40 MG/ML IJ SUSP
40.0000 mg | Freq: Once | INTRAMUSCULAR | Status: DC
  Administered 2020-06-09: 40 mg

## 2020-06-09 MED ORDER — LIDOCAINE HCL 2 % IJ SOLN
20.0000 mL | Freq: Once | INTRAMUSCULAR | Status: AC
  Administered 2020-06-09: 400 mg

## 2020-06-09 MED ORDER — IOHEXOL 180 MG/ML  SOLN
10.0000 mL | Freq: Once | INTRAMUSCULAR | Status: AC
  Administered 2020-06-09: 10 mL via INTRA_ARTICULAR

## 2020-06-09 MED ORDER — LIDOCAINE HCL 2 % IJ SOLN
INTRAMUSCULAR | Status: AC
  Filled 2020-06-09: qty 20

## 2020-06-09 MED ORDER — DEXAMETHASONE SODIUM PHOSPHATE 10 MG/ML IJ SOLN
INTRAMUSCULAR | Status: AC
  Filled 2020-06-09: qty 1

## 2020-06-09 MED ORDER — ROPIVACAINE HCL 2 MG/ML IJ SOLN
9.0000 mL | Freq: Once | INTRAMUSCULAR | Status: AC
  Administered 2020-06-09: 9 mL via PERINEURAL

## 2020-06-09 NOTE — Progress Notes (Signed)
Safety precautions to be maintained throughout the outpatient stay will include: orient to surroundings, keep bed in low position, maintain call bell within reach at all times, provide assistance with transfer out of bed and ambulation.  

## 2020-06-09 NOTE — Progress Notes (Signed)
PROVIDER NOTE: Information contained herein reflects review and annotations entered in association with encounter. Interpretation of such information and data should be left to medically-trained personnel. Information provided to patient can be located elsewhere in the medical record under "Patient Instructions". Document created using STT-dictation technology, any transcriptional errors that may result from process are unintentional.    Patient: Eric Guerrero  Service Category: Procedure  Provider: Edward Jolly, MD  DOB: 1950-09-02  DOS: 06/09/2020  Location: [facility]  MRN: [id number]  Setting: Ambulatory - outpatient  Referring Provider: Sherron Monday, MD  Type: Established Patient  Specialty: Interventional Pain Management  PCP: Sherron Monday, MD   Primary Reason for Visit: Interventional Pain Management Treatment. CC: Back Pain (Left lumbar )  Procedure:          Anesthesia, Analgesia, Anxiolysis:  Type: Diagnostic Sacroiliac Joint Steroid Injection #1 and left piriformis TPI #1 Region: Inferior Lumbosacral Region Level: PIIS (Posterior Inferior Iliac Spine) Laterality: Left-Side  Type: Local Anesthesia  Local Anesthetic: Lidocaine 1-2%  Position: Prone           Indications: 1. SI joint arthritis (left > right)   2. Piriformis syndrome, left   3. Chronic left SI joint pain   4. Chronic pain syndrome    Pain Score: Pre-procedure: 10-Worst pain ever (only when he is walking )/10 Post-procedure: 0-No pain (moving and walking)/10   Pre-op H&P Assessment:  Mr. Holston is a 70 y.o. (year old), male patient, seen today for interventional treatment. He  has no past surgical history on file. Mr. Leichter has a current medication list which includes the following prescription(s): celecoxib, gabapentin, isosorbide mononitrate, lisinopril-hydrochlorothiazide, metformin, methocarbamol, metoprolol succinate, pantoprazole, rivaroxaban, rosuvastatin, terbinafine, and  terbinafine. His primarily concern today is the Back Pain (Left lumbar )  Initial Vital Signs:  Pulse/HCG Rate: 71  Temp: (!) 97 F (36.1 C) Resp: 16 BP: (!) 149/68 SpO2: 100 %  BMI: Estimated body mass index is 32.23 kg/m as calculated from the following:   Height as of this encounter: 5\' 8"  (1.727 m).   Weight as of this encounter: 212 lb (96.2 kg).  Risk Assessment: Allergies: Reviewed. He has No Known Allergies.  Allergy Precautions: None required Coagulopathies: Reviewed. None identified.  Blood-thinner therapy: None at this time Active Infection(s): Reviewed. None identified. Mr. Ridings is afebrile  Site Confirmation: Mr. Noren was asked to confirm the procedure and laterality before marking the site Procedure checklist: Completed Consent: Before the procedure and under the influence of no sedative(s), amnesic(s), or anxiolytics, the patient was informed of the treatment options, risks and possible complications. To fulfill our ethical and legal obligations, as recommended by the American Medical Association's Code of Ethics, I have informed the patient of my clinical impression; the nature and purpose of the treatment or procedure; the risks, benefits, and possible complications of the intervention; the alternatives, including doing nothing; the risk(s) and benefit(s) of the alternative treatment(s) or procedure(s); and the risk(s) and benefit(s) of doing nothing. The patient was provided information about the general risks and possible complications associated with the procedure. These may include, but are not limited to: failure to achieve desired goals, infection, bleeding, organ or nerve damage, allergic reactions, paralysis, and death. In addition, the patient was informed of those risks and complications associated to the procedure, such as failure to decrease pain; infection; bleeding; organ or nerve damage with subsequent damage to sensory, motor, and/or autonomic systems,  resulting in permanent pain, numbness, and/or weakness of  one or several areas of the body; allergic reactions; (i.e.: anaphylactic reaction); and/or death. Furthermore, the patient was informed of those risks and complications associated with the medications. These include, but are not limited to: allergic reactions (i.e.: anaphylactic or anaphylactoid reaction(s)); adrenal axis suppression; blood sugar elevation that in diabetics may result in ketoacidosis or comma; water retention that in patients with history of congestive heart failure may result in shortness of breath, pulmonary edema, and decompensation with resultant heart failure; weight gain; swelling or edema; medication-induced neural toxicity; particulate matter embolism and blood vessel occlusion with resultant organ, and/or nervous system infarction; and/or aseptic necrosis of one or more joints. Finally, the patient was informed that Medicine is not an exact science; therefore, there is also the possibility of unforeseen or unpredictable risks and/or possible complications that may result in a catastrophic outcome. The patient indicated having understood very clearly. We have given the patient no guarantees and we have made no promises. Enough time was given to the patient to ask questions, all of which were answered to the patient's satisfaction. Mr. Santillanes has indicated that he wanted to continue with the procedure. Attestation: I, the ordering provider, attest that I have discussed with the patient the benefits, risks, side-effects, alternatives, likelihood of achieving goals, and potential problems during recovery for the procedure that I have provided informed consent. Date  Time: 06/09/2020  2:30 PM  Pre-Procedure Preparation:  Monitoring: As per clinic protocol. Respiration, ETCO2, SpO2, BP, heart rate and rhythm monitor placed and checked for adequate function Safety Precautions: Patient was assessed for positional comfort and pressure  points before starting the procedure. Time-out: I initiated and conducted the "Time-out" before starting the procedure, as per protocol. The patient was asked to participate by confirming the accuracy of the "Time Out" information. Verification of the correct person, site, and procedure were performed and confirmed by me, the nursing staff, and the patient. "Time-out" conducted as per Joint Commission's Universal Protocol (UP.01.01.01). Time: 1450  Description of Procedure:          Target Area: Inferior, posterior, aspect of the sacroiliac fissure Approach: Posterior, paraspinal, ipsilateral approach. Area Prepped: Entire Lower Lumbosacral Region DuraPrep (Iodine Povacrylex [0.7% available iodine] and Isopropyl Alcohol, 74% w/w) Safety Precautions: Aspiration looking for blood return was conducted prior to all injections. At no point did we inject any substances, as a needle was being advanced. No attempts were made at seeking any paresthesias. Safe injection practices and needle disposal techniques used. Medications properly checked for expiration dates. SDV (single dose vial) medications used. Description of the Procedure: Protocol guidelines were followed. The patient was placed in position over the procedure table. The target area was identified and the area prepped in the usual manner. Skin & deeper tissues infiltrated with local anesthetic. Appropriate amount of time allowed to pass for local anesthetics to take effect. The procedure needle was advanced under fluoroscopic guidance into the sacroiliac joint until a firm endpoint was obtained. Proper needle placement secured. Negative aspiration confirmed. Solution injected in intermittent fashion, asking for systemic symptoms every 0.5cc of injectate. The needles were then removed and the area cleansed, making sure to leave some of the prepping solution back to take advantage of its long term bactericidal properties. Vitals:   06/09/20 1431  06/09/20 1445 06/09/20 1455 06/09/20 1500  BP: (!) 149/68 (!) 153/74 126/84 128/86  Pulse: 71 67 64 65  Resp: 16 (!) 22 20 (!) 21  Temp: (!) 97 F (36.1 C)  TempSrc: Temporal     SpO2: 100% 100% 100% 100%  Weight: 212 lb (96.2 kg)     Height: 5\' 8"  (1.727 m)       Start Time: 1450 hrs. End Time: 1458 hrs. Materials:  Needle(s) Type: Spinal Needle Gauge: 25G Length: 3.5-in Medication(s): Please see orders for medications and dosing details. 5 cc solution made of 4 cc of 0.2% ropivacaine, 1 cc of methylprednisolone.  Injected into the left sacroiliac joint after contrast confirmation.  1 cm lateral, 1 cm deep, 1 cm inferior to the inferior fissure of the left sacroiliac joint, left piriformis trigger point injection was performed under fluoroscopy with contrast confirmation to visualize striated piriformis muscle.  5 cc solution made of 4 cc of 0.2% ropivacaine, 1 cc of Decadron 10 mg/cc injected into the left piriformis muscle under live fluoroscopy and after contrast confirmation.  Patient denied any pain radiating to his left leg during injection.  Imaging Guidance (Non-Spinal):          Type of Imaging Technique: Fluoroscopy Guidance (Non-Spinal) Indication(s): Assistance in needle guidance and placement for procedures requiring needle placement in or near specific anatomical locations not easily accessible without such assistance. Exposure Time: Please see nurses notes. Contrast: Before injecting any contrast, we confirmed that the patient did not have an allergy to iodine, shellfish, or radiological contrast. Once satisfactory needle placement was completed at the desired level, radiological contrast was injected. Contrast injected under live fluoroscopy. No contrast complications. See chart for type and volume of contrast used. Fluoroscopic Guidance: I was personally present during the use of fluoroscopy. "Tunnel Vision Technique" used to obtain the best possible view of the target  area. Parallax error corrected before commencing the procedure. "Direction-depth-direction" technique used to introduce the needle under continuous pulsed fluoroscopy. Once target was reached, antero-posterior, oblique, and lateral fluoroscopic projection used confirm needle placement in all planes. Images permanently stored in EMR. Interpretation: I personally interpreted the imaging intraoperatively. Adequate needle placement confirmed in multiple planes. Appropriate spread of contrast into desired area was observed. No evidence of afferent or efferent intravascular uptake. Permanent images saved into the patient's record.  Post-operative Assessment:  Post-procedure Vital Signs:  Pulse/HCG Rate: 65  Temp: (!) 97 F (36.1 C) Resp: (!) 21 BP: 128/86 SpO2: 100 %  EBL: None  Complications: No immediate post-treatment complications observed by team, or reported by patient.  Note: The patient tolerated the entire procedure well. A repeat set of vitals were taken after the procedure and the patient was kept under observation following institutional policy, for this type of procedure. Post-procedural neurological assessment was performed, showing return to baseline, prior to discharge. The patient was provided with post-procedure discharge instructions, including a section on how to identify potential problems. Should any problems arise concerning this procedure, the patient was given instructions to immediately contact , at any time, without hesitation. In any case, we plan to contact the patient by telephone for a follow-up status report regarding this interventional procedure.  Comments:  No additional relevant information.  Plan of Care  Orders:  Orders Placed This Encounter  Procedures  . DG PA[facility] C-ARM 1-60 MIN NO REPORT    Intraoperative interpretation by procedural physician at [facility].    Standing Status:   Standing    Number of Occurrences:   1    Order Specific  Question:   Reason for exam:    Answer:   Assistance in needle guidance and placement for procedures requiring needle  placement in or near specific anatomical locations not easily accessible without such assistance.    Medications ordered for procedure: Meds ordered this encounter  Medications  . iohexol (OMNIPAQUE) 180 MG/ML injection 10 mL    Must be Myelogram-compatible. If not available, you may substitute with a water-soluble, non-ionic, hypoallergenic, myelogram-compatible radiological contrast medium.  Marland Kitchen. lidocaine (XYLOCAINE) 2 % (with pres) injection 400 mg  . ropivacaine (PF) 2 mg/mL (0.2%) (NAROPIN) injection 9 mL  . dexamethasone (DECADRON) injection 10 mg  . triamcinolone acetonide (KENALOG-40) injection 40 mg   Medications administered: We administered iohexol, lidocaine, ropivacaine (PF) 2 mg/mL (0.2%), dexamethasone, and triamcinolone acetonide.  See the medical record for exact dosing, route, and time of administration.  Follow-up plan:   Return in about 4 weeks (around 07/07/2020) for Post Procedure Evaluation, virtual.    Recent Visits Date Type Provider Dept  05/27/20 Office Visit Edward JollyLateef, Rosha Cocker, MD [facility]  Showing recent visits within past 90 days and meeting all other requirements Today's Visits Date Type Provider Dept  06/09/20 Procedure visit Edward JollyLateef, Kissy Cielo, MD [facility]  Showing today's visits and meeting all other requirements Future Appointments Date Type Provider Dept  07/01/20 Appointment Edward JollyLateef, Delma Drone, MD [facility]  Showing future appointments within next 90 days and meeting all other requirements  Disposition: Discharge home  Discharge (Date  Time): 06/09/2020; 1503 hrs.   Primary Care Physician: Sherron Mondayejan-Sie, S Ahmed, MD [facility] Note by: Edward JollyBilal Braelynne Garinger, MD Date: 06/09/2020; Time: 3:07 PM  Disclaimer:  Medicine is not an exact science. The only guarantee in medicine is that  nothing is guaranteed. It is important to note that the decision to proceed with this intervention was based on the information collected from the patient. The Data and conclusions were drawn from the patient's questionnaire, the interview, and the physical examination. Because the information was provided in large part by the patient, it cannot be guaranteed that it has not been purposely or unconsciously manipulated. Every effort has been made to obtain as much relevant data as possible for this evaluation. It is important to note that the conclusions that lead to this procedure are derived in large part from the available data. Always take into account that the treatment will also be dependent on availability of resources and existing treatment guidelines, considered by other Pain Management Practitioners as being common knowledge and practice, at the time of the intervention. For Medico-Legal purposes, it is also important to point out that variation in procedural techniques and pharmacological choices are the acceptable norm. The indications, contraindications, technique, and results of the above procedure should only be interpreted and judged by a Board-Certified Interventional Pain Specialist with extensive familiarity and expertise in the same exact procedure and technique.

## 2020-06-09 NOTE — Patient Instructions (Signed)

## 2020-06-10 ENCOUNTER — Telehealth: Payer: Self-pay | Admitting: *Deleted

## 2020-06-10 NOTE — Telephone Encounter (Signed)
Voicemail left with patient re; procedure on yesterday, to please call our office if there are any questions or concerns.

## 2020-06-16 ENCOUNTER — Ambulatory Visit: Payer: Medicare Other | Admitting: Student in an Organized Health Care Education/Training Program

## 2020-06-30 DIAGNOSIS — I10 Essential (primary) hypertension: Secondary | ICD-10-CM | POA: Diagnosis not present

## 2020-06-30 DIAGNOSIS — I2510 Atherosclerotic heart disease of native coronary artery without angina pectoris: Secondary | ICD-10-CM | POA: Diagnosis not present

## 2020-06-30 DIAGNOSIS — R079 Chest pain, unspecified: Secondary | ICD-10-CM | POA: Diagnosis not present

## 2020-06-30 DIAGNOSIS — R0602 Shortness of breath: Secondary | ICD-10-CM | POA: Diagnosis not present

## 2020-06-30 DIAGNOSIS — R071 Chest pain on breathing: Secondary | ICD-10-CM | POA: Diagnosis not present

## 2020-07-01 ENCOUNTER — Ambulatory Visit
Payer: Medicare Other | Attending: Student in an Organized Health Care Education/Training Program | Admitting: Student in an Organized Health Care Education/Training Program

## 2020-07-01 ENCOUNTER — Other Ambulatory Visit: Payer: Self-pay

## 2020-07-01 ENCOUNTER — Encounter: Payer: Self-pay | Admitting: Student in an Organized Health Care Education/Training Program

## 2020-07-01 DIAGNOSIS — G8929 Other chronic pain: Secondary | ICD-10-CM

## 2020-07-01 DIAGNOSIS — G894 Chronic pain syndrome: Secondary | ICD-10-CM | POA: Diagnosis not present

## 2020-07-01 DIAGNOSIS — M533 Sacrococcygeal disorders, not elsewhere classified: Secondary | ICD-10-CM | POA: Diagnosis not present

## 2020-07-01 DIAGNOSIS — G5702 Lesion of sciatic nerve, left lower limb: Secondary | ICD-10-CM | POA: Diagnosis not present

## 2020-07-01 DIAGNOSIS — M47818 Spondylosis without myelopathy or radiculopathy, sacral and sacrococcygeal region: Secondary | ICD-10-CM

## 2020-07-01 MED ORDER — METHOCARBAMOL 500 MG PO TABS: 500.0000 mg | ORAL_TABLET | Freq: Two times a day (BID) | ORAL | 5 refills | Status: DC | PRN

## 2020-07-01 NOTE — Progress Notes (Signed)
Patient: Eric Guerrero  Service Category: E/M  Provider: Edward Jolly, MD  DOB: 10-21-1950  DOS: 07/01/2020  Location: Office  MRN: [id number]  Setting: Ambulatory outpatient  Referring Provider: Sherron Monday, MD  Type: Established Patient  Specialty: Interventional Pain Management  PCP: Sherron Monday, MD  Location: Home  Delivery: TeleHealth     Virtual Encounter - Pain Management PROVIDER NOTE: Information contained herein reflects review and annotations entered in association with encounter. Interpretation of such information and data should be left to medically-trained personnel. Information provided to patient can be located elsewhere in the medical record under "Patient Instructions". Document created using STT-dictation technology, any transcriptional errors that may result from process are unintentional.    Contact & Pharmacy Preferred: [phone] Home: [phone] (home) Mobile: There is no such number on file (mobile). E-mail: No e-mail address on record  [facility] [address] [place], [place] - [address] [place] [address] Phone: [phone] Fax: [phone]   Pre-screening  Eric Guerrero offered "in-person" vs "virtual" encounter. He indicated preferring virtual for this encounter.   Reason COVID-19*  Social distancing based on CDC and AMA recommendations.   I contacted Eric Guerrero on 07/01/2020 via video conference.      I clearly identified myself as Edward Jolly, MD. I verified that I was speaking with the correct person using two identifiers (Name: Eric Guerrero, and date of birth: July 25, 1950).  Consent I sought verbal advanced consent from Eric Guerrero for virtual visit interactions. I informed Eric Guerrero of possible security and privacy concerns, risks, and limitations associated with providing "not-in-person" medical evaluation and management services. I also informed Eric Guerrero of the availability of "in-person" appointments. Finally, I  informed him that there would be a charge for the virtual visit and that he could be  personally, fully or partially, financially responsible for it. Eric Guerrero expressed understanding and agreed to proceed.   Historic Elements   Eric Guerrero is a 70 y.o. year old, male patient evaluated today after our last contact on 06/09/2020. Eric Guerrero  has a past medical history of Hyperlipidemia. He also  has no past surgical history on file. Eric Guerrero has a current medication list which includes the following prescription(s): celecoxib, isosorbide mononitrate, lisinopril-hydrochlorothiazide, metformin, metoprolol succinate, pantoprazole, rivaroxaban, rosuvastatin, terbinafine, terbinafine, and methocarbamol. He  reports that he has never smoked. He has never used smokeless tobacco. He reports that he does not drink alcohol and does not use drugs. Eric Guerrero has No Known Allergies.   HPI  Today, he is being contacted for a post-procedure assessment.   Post-Procedure Evaluation  Procedure (06/09/2020):   Type: Diagnostic Sacroiliac Joint Steroid Injection #1 and left piriformis TPI #1 Region: Inferior Lumbosacral Region Level: PIIS (Posterior Inferior Iliac Spine) Laterality: Left-Side  Sedation: Please see nurses note.  Effectiveness during initial hour after procedure(Ultra-Short Term Relief): 100 %   Local anesthetic used: Long-acting (4-6 hours) Effectiveness: Defined as any analgesic benefit obtained secondary to the administration of local anesthetics. This carries significant diagnostic value as to the etiological location, or anatomical origin, of the pain. Duration of benefit is expected to coincide with the duration of the local anesthetic used.  Effectiveness during initial 4-6 hours after procedure(Short-Term Relief): 100 %   Long-term benefit: Defined as any relief past the pharmacologic duration of the local anesthetics.  Effectiveness past the initial 6 hours after procedure(Long-Term  Relief): 90 %   Current benefits: Defined as benefit that persist at this time.  Analgesia:  90-100% better Function: Somewhat improved ROM: Somewhat improved  UDS:  Summary  Date Value Ref Range Status  01/22/2020 Note  Final    Comment:    ==================================================================== Compliance Drug Analysis, Ur ==================================================================== Test                             Result       Flag       Units  Drug Present and Declared for Prescription Verification   Metoprolol                     PRESENT      EXPECTED  Drug Present not Declared for Prescription Verification   Naproxen                       PRESENT      UNEXPECTED  Drug Absent but Declared for Prescription Verification   Gabapentin                     Not Detected UNEXPECTED   Tizanidine                     Not Detected UNEXPECTED    Tizanidine, as indicated in the declared medication list, is not    always detected even when used as directed.  ==================================================================== Test                      Result    Flag   Units      Ref Range   Creatinine              60               mg/dL      >=13 ==================================================================== Declared Medications:  The flagging and interpretation on this report are based on the  following declared medications.  Unexpected results may arise from  inaccuracies in the declared medications.   **Note: The testing scope of this panel includes these medications:   Gabapentin (Neurontin)  Metoprolol (Toprol)   **Note: The testing scope of this panel does not include small to  moderate amounts of these reported medications:   Tizanidine (Zanaflex)   **Note: The testing scope of this panel does not include the  following reported medications:   Celecoxib (Celebrex)  Isosorbide (Imdur)  Metformin (Glucophage)  Rivaroxaban (Xarelto)   Rosuvastatin (Crestor) ==================================================================== For clinical consultation, please call [phone]. ====================================================================      Assessment  The primary encounter diagnosis was SI joint arthritis (left > right). Diagnoses of Piriformis syndrome, left, Chronic left SI joint pain, and Chronic pain syndrome were also pertinent to this visit.  Plan of Care   Eric Guerrero has a current medication list which includes the following long-term medication(s): isosorbide mononitrate, lisinopril-hydrochlorothiazide, metformin, metoprolol succinate, pantoprazole, rivaroxaban, and rosuvastatin.   Significant pain relief after left sacroiliac joint injection and left piriformis trigger point injection.  Patient states that his pain is significantly less by approximately 90% and he is able to perform ADLs more comfortably.  I will refill his Robaxin.  Repeat SI joint, piriformis TPI as needed.  Pharmacotherapy (Medications Ordered): Meds ordered this encounter  Medications  . methocarbamol (ROBAXIN) 500 MG tablet    Sig: Take 1 tablet (500 mg total) by mouth every 12 (twelve) hours as needed for muscle spasms.    Dispense:  60 tablet    Refill:  5    Do not place this medication, or any other prescription from our practice, on "Automatic Refill". Patient may have prescription filled one day early if pharmacy is closed on scheduled refill date.   Orders:  Orders Placed This Encounter  Procedures  . SACROILIAC JOINT INJECTION    Scheduling timeframe: (PRN procedure) Eric Guerrero will call when needed. Clinical indication: Low back pain, w/ or w/o groin pain. Sacroiliac joint pain Sedation: Usually done with sedation. (May be done without sedation if so desired by patient.) Requirements: NPO x 8 hrs.; Driver; Stop blood thinners.    Standing Status:   Standing    Number of Occurrences:   1    Standing  Expiration Date:   07/01/2021    Scheduling Instructions:     Side: Bilateral     Sedation: Patient's choice.    Order Specific Question:   Where will this procedure be performed?    Answer:   [facility]  . TRIGGER POINT INJECTION    Area: Buttocks region (gluteal area) Indications: Piriformis muscle pain; LEFT  piriformis-syndrome; piriformis muscle spasms (F09.323). CPT code: 55732    Standing Status:   Standing    Number of Occurrences:   2    Standing Expiration Date:   12/31/2020    Scheduling Instructions:     Type: Myoneural block (TPI) of piriformis muscle.     Side:  LEFT     Sedation: Patient's choice.     Timeframe: Today    Order Specific Question:   Where will this procedure be performed?    Answer:   [facility]   Follow-up plan:   Return if symptoms worsen or fail to improve.   Recent Visits Date Type Provider Dept  06/09/20 Procedure visit Edward Jolly, MD [facility]  05/27/20 Office Visit Edward Jolly, MD [facility]  Showing recent visits within past 90 days and meeting all other requirements Today's Visits Date Type Provider Dept  07/01/20 Telemedicine Edward Jolly, MD [facility]  Showing today's visits and meeting all other requirements Future Appointments No visits were found meeting these conditions. Showing future appointments within next 90 days and meeting all other requirements  I discussed the assessment and treatment plan with the patient. The patient was provided an opportunity to ask questions and all were answered. The patient agreed with the plan and demonstrated an understanding of the instructions.  Patient advised to call back or seek an in-person evaluation if the symptoms or condition worsens.  Duration of encounter: 20 minutes.  Note by: Edward Jolly, MD Date: 07/01/2020; Time: 9:12 AM

## 2020-08-27 DIAGNOSIS — L600 Ingrowing nail: Secondary | ICD-10-CM | POA: Diagnosis not present

## 2020-08-27 DIAGNOSIS — M5432 Sciatica, left side: Secondary | ICD-10-CM | POA: Diagnosis not present

## 2020-08-27 DIAGNOSIS — B351 Tinea unguium: Secondary | ICD-10-CM | POA: Diagnosis not present

## 2020-08-27 DIAGNOSIS — M542 Cervicalgia: Secondary | ICD-10-CM | POA: Diagnosis not present

## 2020-08-27 DIAGNOSIS — E119 Type 2 diabetes mellitus without complications: Secondary | ICD-10-CM | POA: Diagnosis not present

## 2020-08-27 DIAGNOSIS — E785 Hyperlipidemia, unspecified: Secondary | ICD-10-CM | POA: Diagnosis not present

## 2020-09-01 ENCOUNTER — Ambulatory Visit: Payer: Self-pay | Admitting: Cardiovascular Disease

## 2020-09-01 DIAGNOSIS — I200 Unstable angina: Secondary | ICD-10-CM | POA: Insufficient documentation

## 2020-09-01 DIAGNOSIS — R079 Chest pain, unspecified: Secondary | ICD-10-CM | POA: Diagnosis not present

## 2020-09-01 DIAGNOSIS — E782 Mixed hyperlipidemia: Secondary | ICD-10-CM | POA: Diagnosis not present

## 2020-09-01 DIAGNOSIS — I2510 Atherosclerotic heart disease of native coronary artery without angina pectoris: Secondary | ICD-10-CM | POA: Diagnosis not present

## 2020-09-01 DIAGNOSIS — R0602 Shortness of breath: Secondary | ICD-10-CM | POA: Diagnosis not present

## 2020-09-01 DIAGNOSIS — I10 Essential (primary) hypertension: Secondary | ICD-10-CM | POA: Diagnosis not present

## 2020-09-01 MED ORDER — SODIUM CHLORIDE 0.9% FLUSH
3.0000 mL | Freq: Two times a day (BID) | INTRAVENOUS | Status: DC
  Filled 2020-09-01: qty 3

## 2020-09-04 ENCOUNTER — Ambulatory Visit: Payer: Medicare Other | Admitting: Podiatry

## 2020-09-05 DIAGNOSIS — I200 Unstable angina: Secondary | ICD-10-CM | POA: Insufficient documentation

## 2020-09-11 ENCOUNTER — Ambulatory Visit
Admission: RE | Admit: 2020-09-11 | Discharge: 2020-09-11 | Disposition: A | Discharge status: Home / Self Care | Payer: Medicare Other | Attending: Cardiovascular Disease | Admitting: Cardiovascular Disease

## 2020-09-11 ENCOUNTER — Encounter: Payer: Self-pay | Admitting: Cardiovascular Disease

## 2020-09-11 ENCOUNTER — Encounter
Admission: RE | Disposition: A | Discharge status: Home / Self Care | Payer: Self-pay | Source: Home / Self Care | Attending: Cardiovascular Disease

## 2020-09-11 ENCOUNTER — Other Ambulatory Visit: Payer: Self-pay

## 2020-09-11 DIAGNOSIS — I209 Angina pectoris, unspecified: Secondary | ICD-10-CM | POA: Diagnosis not present

## 2020-09-11 DIAGNOSIS — I25110 Atherosclerotic heart disease of native coronary artery with unstable angina pectoris: Secondary | ICD-10-CM | POA: Insufficient documentation

## 2020-09-11 DIAGNOSIS — I200 Unstable angina: Secondary | ICD-10-CM | POA: Diagnosis present

## 2020-09-11 DIAGNOSIS — R079 Chest pain, unspecified: Secondary | ICD-10-CM | POA: Diagnosis not present

## 2020-09-11 HISTORY — DX: Essential (primary) hypertension: I10

## 2020-09-11 HISTORY — DX: Type 2 diabetes mellitus without complications: E11.9

## 2020-09-11 HISTORY — PX: LEFT HEART CATH AND CORONARY ANGIOGRAPHY: CATH118249

## 2020-09-11 LAB — GLUCOSE, CAPILLARY: Glucose-Capillary: 149 mg/dL — ABNORMAL HIGH (ref 70–99)

## 2020-09-11 LAB — CBC
HCT: 45.8 % (ref 39.0–52.0)
Hemoglobin: 14.5 g/dL (ref 13.0–17.0)
MCH: 23.1 pg — ABNORMAL LOW (ref 26.0–34.0)
MCHC: 31.7 g/dL (ref 30.0–36.0)
MCV: 73 fL — ABNORMAL LOW (ref 80.0–100.0)
Platelets: 316 10*3/uL (ref 150–400)
RBC: 6.27 MIL/uL — ABNORMAL HIGH (ref 4.22–5.81)
RDW: 17.1 % — ABNORMAL HIGH (ref 11.5–15.5)
WBC: 6.5 10*3/uL (ref 4.0–10.5)
nRBC: 0 % (ref 0.0–0.2)

## 2020-09-11 SURGERY — LEFT HEART CATH AND CORONARY ANGIOGRAPHY
Anesthesia: Moderate Sedation | Laterality: Left

## 2020-09-11 MED ORDER — ACETAMINOPHEN 325 MG PO TABS: 650.0000 mg | ORAL_TABLET | ORAL | Status: DC | PRN

## 2020-09-11 MED ORDER — HEPARIN (PORCINE) IN NACL 1000-0.9 UT/500ML-% IV SOLN
INTRAVENOUS | Status: DC | PRN
  Administered 2020-09-11: 500 mL

## 2020-09-11 MED ORDER — SODIUM CHLORIDE 0.9% FLUSH: 3.0000 mL | INTRAVENOUS | Status: DC | PRN

## 2020-09-11 MED ORDER — SODIUM CHLORIDE 0.9 % WEIGHT BASED INFUSION
1.0000 mL/kg/h | INTRAVENOUS | Status: DC
  Administered 2020-09-11: 1 mL/kg/h via INTRAVENOUS

## 2020-09-11 MED ORDER — SODIUM CHLORIDE 0.9 % WEIGHT BASED INFUSION: 1.0000 mL/kg/h | INTRAVENOUS | Status: DC

## 2020-09-11 MED ORDER — SODIUM CHLORIDE 0.9 % IV SOLN: 250.0000 mL | INTRAVENOUS | Status: DC | PRN

## 2020-09-11 MED ORDER — HYDRALAZINE HCL 20 MG/ML IJ SOLN: 10.0000 mg | INTRAMUSCULAR | Status: DC | PRN

## 2020-09-11 MED ORDER — SODIUM CHLORIDE 0.9 % WEIGHT BASED INFUSION
3.0000 mL/kg/h | INTRAVENOUS | Status: DC
  Administered 2020-09-11: 3 mL/kg/h via INTRAVENOUS

## 2020-09-11 MED ORDER — LABETALOL HCL 5 MG/ML IV SOLN: 10.0000 mg | INTRAVENOUS | Status: DC | PRN

## 2020-09-11 MED ORDER — ASPIRIN 81 MG PO CHEW
CHEWABLE_TABLET | ORAL | Status: AC
  Filled 2020-09-11: qty 1

## 2020-09-11 MED ORDER — MIDAZOLAM HCL 2 MG/2ML IJ SOLN
INTRAMUSCULAR | Status: DC | PRN
  Administered 2020-09-11: 2 mg via INTRAVENOUS

## 2020-09-11 MED ORDER — FENTANYL CITRATE (PF) 100 MCG/2ML IJ SOLN
INTRAMUSCULAR | Status: AC
  Filled 2020-09-11: qty 2

## 2020-09-11 MED ORDER — HEPARIN (PORCINE) IN NACL 1000-0.9 UT/500ML-% IV SOLN
INTRAVENOUS | Status: AC
  Filled 2020-09-11: qty 1000

## 2020-09-11 MED ORDER — ASPIRIN 81 MG PO CHEW
81.0000 mg | CHEWABLE_TABLET | ORAL | Status: AC
  Administered 2020-09-11: 81 mg via ORAL

## 2020-09-11 MED ORDER — FENTANYL CITRATE (PF) 100 MCG/2ML IJ SOLN
INTRAMUSCULAR | Status: DC | PRN
  Administered 2020-09-11: 50 ug via INTRAVENOUS

## 2020-09-11 MED ORDER — LIDOCAINE HCL (PF) 1 % IJ SOLN
INTRAMUSCULAR | Status: AC
  Filled 2020-09-11: qty 30

## 2020-09-11 MED ORDER — IOHEXOL 300 MG/ML  SOLN
INTRAMUSCULAR | Status: DC | PRN
  Administered 2020-09-11: 60 mL

## 2020-09-11 MED ORDER — LIDOCAINE HCL (PF) 1 % IJ SOLN
INTRAMUSCULAR | Status: DC | PRN
  Administered 2020-09-11: 10 mL

## 2020-09-11 MED ORDER — SODIUM CHLORIDE 0.9% FLUSH: 3.0000 mL | Freq: Two times a day (BID) | INTRAVENOUS | Status: DC

## 2020-09-11 MED ORDER — MIDAZOLAM HCL 2 MG/2ML IJ SOLN
INTRAMUSCULAR | Status: AC
  Filled 2020-09-11: qty 2

## 2020-09-11 MED ORDER — ONDANSETRON HCL 4 MG/2ML IJ SOLN: 4.0000 mg | Freq: Four times a day (QID) | INTRAMUSCULAR | Status: DC | PRN

## 2020-09-11 SURGICAL SUPPLY — 10 items
CATH INFINITI 5FR JL4 (CATHETERS) ×2 IMPLANT
CATH INFINITI JR4 5F (CATHETERS) ×2 IMPLANT
DEVICE CLOSURE MYNXGRIP 5F (Vascular Products) ×2 IMPLANT
NEEDLE PERC 18GX7CM (NEEDLE) ×2 IMPLANT
PACK CARDIAC CATH (CUSTOM PROCEDURE TRAY) ×2 IMPLANT
PROTECTION STATION PRESSURIZED (MISCELLANEOUS) ×2
SET ATX SIMPLICITY (MISCELLANEOUS) ×2 IMPLANT
SHEATH AVANTI 5FR X 11CM (SHEATH) ×2 IMPLANT
STATION PROTECTION PRESSURIZED (MISCELLANEOUS) ×1 IMPLANT
WIRE GUIDERIGHT .035X150 (WIRE) ×2 IMPLANT

## 2020-09-19 DIAGNOSIS — R079 Chest pain, unspecified: Secondary | ICD-10-CM | POA: Diagnosis not present

## 2020-09-19 DIAGNOSIS — I10 Essential (primary) hypertension: Secondary | ICD-10-CM | POA: Diagnosis not present

## 2020-09-19 DIAGNOSIS — I2510 Atherosclerotic heart disease of native coronary artery without angina pectoris: Secondary | ICD-10-CM | POA: Diagnosis not present

## 2020-09-19 DIAGNOSIS — E119 Type 2 diabetes mellitus without complications: Secondary | ICD-10-CM | POA: Diagnosis not present

## 2020-11-10 DIAGNOSIS — L600 Ingrowing nail: Secondary | ICD-10-CM | POA: Diagnosis not present

## 2020-11-10 DIAGNOSIS — E785 Hyperlipidemia, unspecified: Secondary | ICD-10-CM | POA: Diagnosis not present

## 2020-11-10 DIAGNOSIS — I739 Peripheral vascular disease, unspecified: Secondary | ICD-10-CM | POA: Diagnosis not present

## 2020-11-10 DIAGNOSIS — E119 Type 2 diabetes mellitus without complications: Secondary | ICD-10-CM | POA: Diagnosis not present

## 2020-11-10 DIAGNOSIS — S39012A Strain of muscle, fascia and tendon of lower back, initial encounter: Secondary | ICD-10-CM | POA: Diagnosis not present

## 2020-11-10 DIAGNOSIS — M542 Cervicalgia: Secondary | ICD-10-CM | POA: Diagnosis not present

## 2020-11-10 DIAGNOSIS — B351 Tinea unguium: Secondary | ICD-10-CM | POA: Diagnosis not present

## 2020-11-10 DIAGNOSIS — M5432 Sciatica, left side: Secondary | ICD-10-CM | POA: Diagnosis not present

## 2020-11-20 DIAGNOSIS — E119 Type 2 diabetes mellitus without complications: Secondary | ICD-10-CM | POA: Diagnosis not present

## 2020-11-24 DIAGNOSIS — M5432 Sciatica, left side: Secondary | ICD-10-CM | POA: Diagnosis not present

## 2020-11-24 DIAGNOSIS — E785 Hyperlipidemia, unspecified: Secondary | ICD-10-CM | POA: Diagnosis not present

## 2020-11-24 DIAGNOSIS — H5213 Myopia, bilateral: Secondary | ICD-10-CM | POA: Diagnosis not present

## 2020-11-24 DIAGNOSIS — B351 Tinea unguium: Secondary | ICD-10-CM | POA: Diagnosis not present

## 2020-11-24 DIAGNOSIS — S39012A Strain of muscle, fascia and tendon of lower back, initial encounter: Secondary | ICD-10-CM | POA: Diagnosis not present

## 2020-11-24 DIAGNOSIS — L600 Ingrowing nail: Secondary | ICD-10-CM | POA: Diagnosis not present

## 2020-11-24 DIAGNOSIS — M542 Cervicalgia: Secondary | ICD-10-CM | POA: Diagnosis not present

## 2020-11-24 DIAGNOSIS — E119 Type 2 diabetes mellitus without complications: Secondary | ICD-10-CM | POA: Diagnosis not present

## 2021-01-29 DIAGNOSIS — H524 Presbyopia: Secondary | ICD-10-CM | POA: Diagnosis not present

## 2021-04-21 DIAGNOSIS — D509 Iron deficiency anemia, unspecified: Secondary | ICD-10-CM | POA: Diagnosis not present

## 2021-04-21 DIAGNOSIS — E119 Type 2 diabetes mellitus without complications: Secondary | ICD-10-CM | POA: Diagnosis not present

## 2021-04-21 DIAGNOSIS — E785 Hyperlipidemia, unspecified: Secondary | ICD-10-CM | POA: Diagnosis not present

## 2021-04-21 DIAGNOSIS — I10 Essential (primary) hypertension: Secondary | ICD-10-CM | POA: Diagnosis not present

## 2021-04-27 DIAGNOSIS — E785 Hyperlipidemia, unspecified: Secondary | ICD-10-CM | POA: Diagnosis not present

## 2021-04-27 DIAGNOSIS — B351 Tinea unguium: Secondary | ICD-10-CM | POA: Diagnosis not present

## 2021-04-27 DIAGNOSIS — M542 Cervicalgia: Secondary | ICD-10-CM | POA: Diagnosis not present

## 2021-04-27 DIAGNOSIS — M5432 Sciatica, left side: Secondary | ICD-10-CM | POA: Diagnosis not present

## 2021-04-27 DIAGNOSIS — E119 Type 2 diabetes mellitus without complications: Secondary | ICD-10-CM | POA: Diagnosis not present

## 2021-04-27 DIAGNOSIS — L600 Ingrowing nail: Secondary | ICD-10-CM | POA: Diagnosis not present

## 2021-04-27 DIAGNOSIS — S39012A Strain of muscle, fascia and tendon of lower back, initial encounter: Secondary | ICD-10-CM | POA: Diagnosis not present

## 2021-07-22 DIAGNOSIS — E785 Hyperlipidemia, unspecified: Secondary | ICD-10-CM | POA: Diagnosis not present

## 2021-07-22 DIAGNOSIS — I739 Peripheral vascular disease, unspecified: Secondary | ICD-10-CM | POA: Diagnosis not present

## 2021-08-31 DIAGNOSIS — M5432 Sciatica, left side: Secondary | ICD-10-CM | POA: Diagnosis not present

## 2021-08-31 DIAGNOSIS — I739 Peripheral vascular disease, unspecified: Secondary | ICD-10-CM | POA: Diagnosis not present

## 2021-08-31 DIAGNOSIS — B351 Tinea unguium: Secondary | ICD-10-CM | POA: Diagnosis not present

## 2021-08-31 DIAGNOSIS — E119 Type 2 diabetes mellitus without complications: Secondary | ICD-10-CM | POA: Diagnosis not present

## 2021-08-31 DIAGNOSIS — M542 Cervicalgia: Secondary | ICD-10-CM | POA: Diagnosis not present

## 2021-08-31 DIAGNOSIS — Z0001 Encounter for general adult medical examination with abnormal findings: Secondary | ICD-10-CM | POA: Diagnosis not present

## 2021-08-31 DIAGNOSIS — L600 Ingrowing nail: Secondary | ICD-10-CM | POA: Diagnosis not present

## 2021-08-31 DIAGNOSIS — D509 Iron deficiency anemia, unspecified: Secondary | ICD-10-CM | POA: Diagnosis not present

## 2021-08-31 DIAGNOSIS — E785 Hyperlipidemia, unspecified: Secondary | ICD-10-CM | POA: Diagnosis not present

## 2021-08-31 DIAGNOSIS — I10 Essential (primary) hypertension: Secondary | ICD-10-CM | POA: Diagnosis not present

## 2021-08-31 DIAGNOSIS — S39012A Strain of muscle, fascia and tendon of lower back, initial encounter: Secondary | ICD-10-CM | POA: Diagnosis not present

## 2021-11-02 NOTE — Progress Notes (Unsigned)
Patient: Eric Guerrero  Service Category: E/M  Provider: Gaspar Cola, MD  DOB: [birth year]  DOS: 11/04/2021  Referring Provider: Jodi Marble, MD  MRN: [id number]  Setting: Ambulatory outpatient  PCP: Jodi Marble, MD  Type: New Patient  Specialty: Interventional Pain Management    Location: Office  Delivery: Face-to-face     Primary Reason(s) for Visit: Encounter for initial evaluation of one or more chronic problems (new to examiner) potentially causing chronic pain, and posing a threat to normal musculoskeletal function. (Level of risk: High) CC: Shoulder Pain (left)  HPI  Mr. Mccombie is a 71 y.o. year old, male patient, who comes for the first time to our practice referred by Jodi Marble, MD for our initial evaluation of his chronic pain. He has Hyperlipidemia; Pain in limb; PAD (peripheral artery disease) ([place]); Chronic low back pain (Left) w/o sciatica; Chronic hip pain (Left); Chronic sacroiliac joint pain (Left); Chronic shoulder pain (Left); Rotator cuff tear arthropathy (Left); Chronic pain syndrome; SI joint arthritis (left > right); Lumbar facet arthropathy; Piriformis syndrome (Left); Unstable angina ([place]); Pharmacologic therapy; Disorder of skeletal system; Problems influencing health status; Grade 1 Retrolisthesis of L1/L2 and L2/L3; Pars defect of lumbar spine (Left: L5); Grade 1 Anterolisthesis of lumbar spine (L4/L5); DDD (degenerative disc disease), lumbar; Sacroiliitis (HCC) (Bilateral) (L>R); Sacroiliac joint dysfunction (Left); Somatic dysfunction of sacroiliac joint (Left); Thoracic radiculopathy (Left); Radicular pain of shoulder (Left); Ischemic chest pain ([place]) (Left); Left-sided chest wall pain; and Claudication of left upper extremity due to atherosclerosis ([place]) on their problem list. Today he comes in for evaluation of his Shoulder Pain (left)  Pain Assessment: Location: Left Shoulder (shoulder, arm, hip under his arm pit) Radiating: pain radiaties down  his left side to his hip Onset: More than a month ago Duration: Chronic pain Quality: Aching, Throbbing, Tingling Severity: 5 /10 (subjective, self-reported pain score)  Effect on ADL: limits my daily activities Timing: Intermittent Modifying factors: sitting down, meds BP: (!) 140/87  HR: 77  Onset and Duration: Sudden and Present longer than 3 months Cause of pain: Unknown Severity: Getting worse, NAS-11 at its worse: 7/10, NAS-11 at its best: 5/10, NAS-11 now: 5/10, and NAS-11 on the average: 5/10 Timing: During activity or exercise Aggravating Factors: Walking Alleviating Factors: Medications Associated Problems: Inability to concentrate Quality of Pain: Pressure-like Previous Examinations or Tests: The patient denies   Previous Treatments: Trigger point injections  Review of initial evaluation by Dr. Gillis Santa (01/21/2020): "Mr. Solivan is a very pleasant 71 year old male who works at a [employer] who presents with a chief complaint of left posterior shoulder pain as well as left low back pain that does not radiate.  Of note this pain has been present for many years.  Mr. Santoro states that he used to be very physically active and was able to walk 4 to 5 miles a day.  Now he is having left low back pain that limits his ability to bear weight and walk for an extended period of time.  He states that he if he is not exerting himself, his pain is well managed.  He utilizes Celebrex in the morning.  He is also on Xarelto for cardiac disease.  Patient is also a diabetic.  He is participating in physical therapy which he does not find very helpful in managing his pain.  It has helped with his range of motion.  He is looking forward to getting his quality of life back so that he can  participate in hobbies and activities of daily living."  According to the patient the primary area of pain is that of the shoulder (Left) and over arm (posterolateral aspect including the axilla.  The patient  indicates that while he is at rest, he has absolutely no pain.  However, when he starts walking, after a while, he will developed this pain which also seems to radiate towards his left chest area, between his nipple area and the clavicle.  He refers that when he develops this pain, he will sit down and rest and this causes the pain to go away.  He denies any pain to the cervical region.  Physical exam today demonstrated the patient to have full range of motion of the cervical spine with no triggering of any pain or discomfort.  He also denies being able to trigger the pain with movement of his left shoulder.  Today he demonstrated full range of motion of the left shoulder with no pain on discomfort upon performing the maneuvers.  He denies ever having had any kind of surgeries of his neck or shoulder, physical therapy, recent x-rays, nerve blocks, or joint injections.  He also denies having had any type of pain, numbness or weakness below the elbow.  He cannot reproduce the pain by movement of the upper extremity.  He refers having seen the cardiologist and having been told that he has no problems.  He indicates that he had a stress test but he also indicated that during the stress test he did not have any of the symptoms.  He refers having seen the cardiologist approximately 6 months ago and he also indicates that it has slowly been getting worse.  He is on Xarelto anticoagulation and has been on it for the past year and a half.  He refers having a history of a left lower extremity DVT.  His record indicates that he has peripheral vascular disease.  The patient had previously been seen at our facility by Dr. Gillis Santa for pain that he was experiencing in the lower back.  At that time x-rays of the SI joint, lumbar spine, and left hip were done, as well as x-rays of the left shoulder.  On 06/09/2020 he had a left-sided sacroiliac joint injection done which completely eliminated the pain on the left lower back  and at this point the patient refers that it has not returned.  In terms of the differential diagnosis, I would have to include the possibility of an intermittent left-sided thoracic radiculitis.  Because the pain seems to be in the cervical thoracic region and following the distribution of the T1/T2 dermatomes, I will be ordering x-rays of the cervical and thoracic spine to evaluate that area.  However, based on the apparent claudication-like characteristics of this pain, it is my impression that this pain is ischemic in nature.  The concern that I have is that he may be cardiogenic.  However, he denies any shortness of breath or any other symptoms that would be typical of angina pectoris and therefore if it is cardiogenic it would be atypical.  Historic Controlled Substance Pharmacotherapy Review  PMP and historical list of controlled substances: None Current opioid analgesics:   None MME/day: 0 mg/day  Historical Monitoring: The patient  reports no history of drug use. List of prior UDS Testing: No results found for: "MDMA", "COCAINSCRNUR", "PCPSCRNUR", "PCPQUANT", "CANNABQUANT", "THCU", "ETH", "CBDTHCR", "D8THCCBX", "D9THCCBX" Historical Background Evaluation:  PMP: PDMP reviewed during this encounter. Review of the past 99-month conducted.  PMP NARX Score Report:  Narcotic: 000 Sedative: 000 Stimulant: 000 [place] Department of public safety, offender search: Editor, commissioning Information) Non-contributory Risk Assessment Profile: Aberrant behavior: None observed or detected today Risk factors for fatal opioid overdose: None identified today PMP NARX Overdose Risk Score: 000 Fatal overdose hazard ratio (HR): Calculation deferred Non-fatal overdose hazard ratio (HR): Calculation deferred Risk of opioid abuse or dependence: 0.7-3.0% with doses ? 36 MME/day and 6.1-26% with doses ? 120 MME/day. Substance use disorder (SUD) risk level: See below Personal History of Substance Abuse  (SUD-Substance use disorder):  Alcohol: Negative  Illegal Drugs: Negative  Rx Drugs: Negative  ORT Risk Level calculation: Low Risk  Opioid Risk Tool - 11/04/21 0929       Family History of Substance Abuse   Alcohol Negative    Illegal Drugs Negative    Rx Drugs Negative      Personal History of Substance Abuse   Alcohol Negative    Illegal Drugs Negative    Rx Drugs Negative      Age   Age between 34-45 years  No      History of Preadolescent Sexual Abuse   History of Preadolescent Sexual Abuse Negative or Male      Psychological Disease   Psychological Disease Negative    Depression Positive      Total Score   Opioid Risk Tool Scoring 1    Opioid Risk Interpretation Low Risk            ORT Scoring interpretation table:  Score <3 = Low Risk for SUD  Score between 4-7 = Moderate Risk for SUD  Score >8 = High Risk for Opioid Abuse   PHQ-2 Depression Scale:  Total score:    PHQ-2 Scoring interpretation table: (Score and probability of major depressive disorder)  Score 0 = No depression  Score 1 = 15.4% Probability  Score 2 = 21.1% Probability  Score 3 = 38.4% Probability  Score 4 = 45.5% Probability  Score 5 = 56.4% Probability  Score 6 = 78.6% Probability   PHQ-9 Depression Scale:  Total score:    PHQ-9 Scoring interpretation table:  Score 0-4 = No depression  Score 5-9 = Mild depression  Score 10-14 = Moderate depression  Score 15-19 = Moderately severe depression  Score 20-27 = Severe depression (2.4 times higher risk of SUD and 2.89 times higher risk of overuse)   Pharmacologic Plan: As per protocol, I have not taken over any controlled substance management, pending the results of ordered tests and/or consults.            Initial impression: Pending review of available data and ordered tests.  Meds   Current Outpatient Medications:    diclofenac Sodium (VOLTAREN) 1 % GEL, Apply topically 4 (four) times daily., Disp: , Rfl:     Empagliflozin-metFORMIN HCl (SYNJARDY) 07-998 MG TABS, Take 5 mg by mouth daily., Disp: , Rfl:    methocarbamol (ROBAXIN) 500 MG tablet, Take 1 tablet (500 mg total) by mouth every 12 (twelve) hours as needed for muscle spasms., Disp: 60 tablet, Rfl: 5   pantoprazole (PROTONIX) 40 MG tablet, Take 40 mg by mouth daily., Disp: , Rfl:    rivaroxaban (XARELTO) 2.5 MG TABS tablet, Take 2.5 mg by mouth 2 (two) times daily., Disp: , Rfl:    rosuvastatin (CRESTOR) 10 MG tablet, Take 40 mg by mouth daily., Disp: , Rfl:  No current facility-administered medications for this visit.  Facility-Administered Medications Ordered in Other Visits:  sodium chloride flush (NS) 0.9 % injection 3 mL, 3 mL, Intravenous, Q12H, Dionisio David, MD  Imaging Review  Shoulder Imaging: Shoulder-L DG: Results for orders placed during the hospital encounter of 02/13/20 DG Shoulder Left  Narrative CLINICAL DATA:  Left shoulder pain.  No known injury.  EXAM: LEFT SHOULDER - 2+ VIEW  COMPARISON:  None.  FINDINGS: There is no evidence of fracture or dislocation. There is no evidence of arthropathy or other focal bone abnormality. Soft tissues are unremarkable.  IMPRESSION: Normal examination.   Electronically Signed By: Claudie Revering M.D. On: 02/14/2020 14:46  Lumbosacral Imaging: Lumbar DG Bending views: Results for orders placed during the hospital encounter of 02/13/20 DG Lumbar Spine Complete W/Bend  Narrative CLINICAL DATA:  Low back and posterior left hip pain for 2 years.  EXAM: LUMBAR SPINE - COMPLETE WITH BENDING VIEWS  COMPARISON:  None.  FINDINGS: There are 5 non rib-bearing lumbar type vertebrae. There is mild upper lumbar dextroscoliosis. There is trace retrolisthesis of L1 on L2 and L2 on L3, and there is trace anterolisthesis of L4 on L5. No significant change is seen with flexion or extension. There may be a left-sided pars defect at L5 with evaluation for a right-sided defect  limited by under penetration on the corresponding oblique radiograph. There is no significant listhesis at L5-S1.  Vertebral body heights are preserved. Moderate endplate spurring and mild-to-moderate disc space narrowing are present at L2-3, and there is milder narrowing and spurring at other levels. Aortic atherosclerosis is noted.  IMPRESSION: 1. Mild-to-moderate lumbar disc degeneration, greatest at L2-3. 2. Mild multilevel degenerative listhesis without evidence of dynamic instability.   Electronically Signed By: Logan Bores M.D. On: 02/14/2020 14:38   Sacroiliac Joint Imaging: Sacroiliac Joint DG: Results for orders placed during the hospital encounter of 02/13/20 DG Si Joints  Narrative CLINICAL DATA:  Posterior left hip pain for the past 2 years. No known injury.  EXAM: BILATERAL SACROILIAC JOINTS - 3+ VIEW  COMPARISON:  Left hip radiographs obtained at the same time.  FINDINGS: Mild sclerosis on both sides of the left sacroiliac joint and minimal sclerosis on the iliac side of the right sacroiliac joint. Lower lumbar spine degenerative changes.  IMPRESSION: 1. Mild left and minimal right sacroiliitis. 2. Lower lumbar spine degenerative changes.   Electronically Signed By: Claudie Revering M.D. On: 02/14/2020 14:45  Hip Imaging: Hip-L DG 2-3 views: Results for orders placed during the hospital encounter of 02/13/20 DG HIP UNILAT W OR W/O PELVIS 2-3 VIEWS LEFT  Narrative CLINICAL DATA:  Posterior left hip pain for the past 2 years. No known injury.  EXAM: DG HIP (WITH OR WITHOUT PELVIS) 2-3V LEFT  COMPARISON:  None.  FINDINGS: Normal appearing left hip. Mild sclerosis on both sides of the sacroiliac joints bilaterally. Lower lumbar spine degenerative changes and mild scoliosis.  IMPRESSION: 1. Normal appearing left hip. 2. Mild bilateral sacroiliitis. 3. Lower lumbar spine degenerative changes.   Electronically Signed By: Claudie Revering  M.D. On: 02/14/2020 14:44  Complexity Note: Imaging results reviewed.                         ROS  Cardiovascular: High blood pressure Pulmonary or Respiratory: No reported pulmonary signs or symptoms such as wheezing and difficulty taking a deep full breath (Asthma), difficulty blowing air out (Emphysema), coughing up mucus (Bronchitis), persistent dry cough, or temporary stoppage of breathing during sleep Neurological: No reported neurological signs or  symptoms such as seizures, abnormal skin sensations, urinary and/or fecal incontinence, being born with an abnormal open spine and/or a tethered spinal cord Psychological-Psychiatric: Depressed Gastrointestinal: No reported gastrointestinal signs or symptoms such as vomiting or evacuating blood, reflux, heartburn, alternating episodes of diarrhea and constipation, inflamed or scarred liver, or pancreas or irrregular and/or infrequent bowel movements Genitourinary: No reported renal or genitourinary signs or symptoms such as difficulty voiding or producing urine, peeing blood, non-functioning kidney, kidney stones, difficulty emptying the bladder, difficulty controlling the flow of urine, or chronic kidney disease Hematological: No reported hematological signs or symptoms such as prolonged bleeding, low or poor functioning platelets, bruising or bleeding easily, hereditary bleeding problems, low energy levels due to low hemoglobin or being anemic Endocrine: No reported endocrine signs or symptoms such as high or low blood sugar, rapid heart rate due to high thyroid levels, obesity or weight gain due to slow thyroid or thyroid disease Rheumatologic: No reported rheumatological signs and symptoms such as fatigue, joint pain, tenderness, swelling, redness, heat, stiffness, decreased range of motion, with or without associated rash Musculoskeletal: Negative for myasthenia gravis, muscular dystrophy, multiple sclerosis or malignant hyperthermia Work  History: Working full time  Allergies  Mr. Temme has No Known Allergies.  Laboratory Chemistry Profile   Renal No results found for: "BUN", "CREATININE", "LABCREA", "BCR", "GFR", "GFRAA", "GFRNONAA", "SPECGRAV", "PHUR", "PROTEINUR"   Electrolytes No results found for: "NA", "K", "CL", "CALCIUM", "MG", "PHOS"   Hepatic Lab Results  Component Value Date   AST 20 05/01/2020   ALT 24 05/01/2020   ALBUMIN 4.2 05/01/2020   ALKPHOS 108 05/01/2020     ID No results found for: "LYMEIGGIGMAB", "HIV", "SARSCOV2NAA", "STAPHAUREUS", "MRSAPCR", "HCVAB", "PREGTESTUR", "RMSFIGG", "QFVRPH1IGG", "QFVRPH2IGG"   Bone No results found for: "VD25OH", "VD125OH2TOT", "MW4132GM0", "NU2725DG6", "25OHVITD1", "25OHVITD2", "25OHVITD3", "TESTOFREE", "TESTOSTERONE"   Endocrine No results found for: "GLUCOSE", "GLUCOSEU", "HGBA1C", "TSH", "FREET4", "TESTOFREE", "TESTOSTERONE", "SHBG", "ESTRADIOL", "ESTRADIOLPCT", "ESTRADIOLFRE", "LABPREG", "ACTH", "CRTSLPL", "UCORFRPERLTR", "UCORFRPERDAY", "CORTISOLBASE"   Neuropathy No results found for: "VITAMINB12", "FOLATE", "HGBA1C", "HIV"   CNS No results found for: "COLORCSF", "APPEARCSF", "RBCCOUNTCSF", "WBCCSF", "POLYSCSF", "LYMPHSCSF", "EOSCSF", "PROTEINCSF", "GLUCCSF", "JCVIRUS", "CSFOLI", "IGGCSF", "LABACHR", "ACETBL"   Inflammation (CRP: Acute  ESR: Chronic) No results found for: "CRP", "ESRSEDRATE", "LATICACIDVEN"   Rheumatology No results found for: "RF", "ANA", "LABURIC", "URICUR", "LYMEIGGIGMAB", "LYMEABIGMQN", "HLAB27"   Coagulation Lab Results  Component Value Date   PLT 316 09/11/2020     Cardiovascular Lab Results  Component Value Date   HGB 14.5 09/11/2020   HCT 45.8 09/11/2020     Screening No results found for: "SARSCOV2NAA", "COVIDSOURCE", "STAPHAUREUS", "MRSAPCR", "HCVAB", "HIV", "PREGTESTUR"   Cancer No results found for: "CEA", "CA125", "LABCA2"   Allergens No results found for: "ALMOND", "APPLE", "ASPARAGUS", "AVOCADO", "BANANA",  "BARLEY", "BASIL", "BAYLEAF", "GREENBEAN", "LIMABEAN", "WHITEBEAN", "BEEFIGE", "REDBEET", "BLUEBERRY", "BROCCOLI", "CABBAGE", "MELON", "CARROT", "CASEIN", "CASHEWNUT", "CAULIFLOWER", "CELERY"     Note: Lab results reviewed.  [place]  Drug: Mr. Johnsey  reports no history of drug use. Alcohol:  reports no history of alcohol use. Tobacco:  reports that he has never smoked. He has never used smokeless tobacco. Medical:  has a past medical history of Diabetes mellitus without complication ([place]), Hyperlipidemia, and Hypertension. Family: family history is not on file.  Past Surgical History:  Procedure Laterality Date   LEFT HEART CATH AND CORONARY ANGIOGRAPHY Left 09/11/2020   Procedure: LEFT HEART CATH AND CORONARY ANGIOGRAPHY;  Surgeon: Dionisio David, MD;  Location: [facility];  Service: Cardiovascular;  Laterality: Left;  Active Ambulatory Problems    Diagnosis Date Noted   Hyperlipidemia 04/04/2018   Pain in limb 04/04/2018   PAD (peripheral artery disease) (HCC) 04/04/2018   Chronic low back pain (Left) w/o sciatica 01/21/2020   Chronic hip pain (Left) 01/21/2020   Chronic sacroiliac joint pain (Left) 01/21/2020   Chronic shoulder pain (Left) 01/21/2020   Rotator cuff tear arthropathy (Left) 01/21/2020   Chronic pain syndrome 01/21/2020   SI joint arthritis (left > right) 02/27/2020   Lumbar facet arthropathy 02/27/2020   Piriformis syndrome (Left) 05/27/2020   Unstable angina ([place]) 09/01/2020   Pharmacologic therapy 11/03/2021   Disorder of skeletal system 11/03/2021   Problems influencing health status 11/03/2021   Grade 1 Retrolisthesis of L1/L2 and L2/L3 11/03/2021   Pars defect of lumbar spine (Left: L5) 11/03/2021   Grade 1 Anterolisthesis of lumbar spine (L4/L5) 11/03/2021   DDD (degenerative disc disease), lumbar 11/03/2021   Sacroiliitis (HCC) (Bilateral) (L>R) 11/03/2021   Sacroiliac joint dysfunction (Left) 11/03/2021   Somatic dysfunction of sacroiliac joint  (Left) 11/03/2021   Thoracic radiculopathy (Left) 11/04/2021   Radicular pain of shoulder (Left) 11/04/2021   Ischemic chest pain (HCC) (Left) 11/04/2021   Left-sided chest wall pain 11/04/2021   Claudication of left upper extremity due to atherosclerosis ([place]) 11/04/2021   Resolved Ambulatory Problems    Diagnosis Date Noted   No Resolved Ambulatory Problems   Past Medical History:  Diagnosis Date   Diabetes mellitus without complication ([place])    Hypertension    Constitutional Exam  General appearance: Well nourished, well developed, and well hydrated. In no apparent acute distress Vitals:   11/04/21 0918  BP: (!) 140/87  Pulse: 77  Temp: 98 F (36.7 C)  SpO2: 100%  Weight: 195 lb (88.5 kg)  Height: 5' 8"  (1.727 m)   BMI Assessment: Estimated body mass index is 29.65 kg/m as calculated from the following:   Height as of this encounter: 5' 8"  (1.727 m).   Weight as of this encounter: 195 lb (88.5 kg).  BMI interpretation table: BMI level Category Range association with higher incidence of chronic pain  <18 kg/m2 Underweight   18.5-24.9 kg/m2 Ideal body weight   25-29.9 kg/m2 Overweight Increased incidence by 20%  30-34.9 kg/m2 Obese (Class I) Increased incidence by 68%  35-39.9 kg/m2 Severe obesity (Class II) Increased incidence by 136%  >40 kg/m2 Extreme obesity (Class III) Increased incidence by 254%   Patient's current BMI Ideal Body weight  Body mass index is 29.65 kg/m. Ideal body weight: 68.4 kg (150 lb 12.7 oz) Adjusted ideal body weight: 76.4 kg (168 lb 7.6 oz)   BMI Readings from Last 4 Encounters:  11/04/21 29.65 kg/m  09/11/20 32.08 kg/m  06/09/20 32.23 kg/m  05/27/20 32.23 kg/m   Wt Readings from Last 4 Encounters:  11/04/21 195 lb (88.5 kg)  09/11/20 211 lb (95.7 kg)  06/09/20 212 lb (96.2 kg)  05/27/20 212 lb (96.2 kg)    Psych/Mental status: Alert, oriented x 3 (person, place, & time)       Eyes: PERLA Respiratory: No evidence of acute  respiratory distress  Assessment  Primary Diagnosis & Pertinent Problem List: The primary encounter diagnosis was Chronic pain syndrome. Diagnoses of Chronic shoulder pain (Left), Radicular pain of shoulder (Left), Ischemic chest pain ([place]) (Left), Claudication of left upper extremity due to atherosclerosis [facility]), Left-sided chest wall pain, Thoracic radiculopathy (Left), Pharmacologic therapy, Disorder of skeletal system, and Problems influencing health status were also pertinent to this  visit.  Visit Diagnosis (New problems to examiner): 1. Chronic pain syndrome   2. Chronic shoulder pain (Left)   3. Radicular pain of shoulder (Left)   4. Ischemic chest pain (HCC) (Left)   5. Claudication of left upper extremity due to atherosclerosis ([place])   6. Left-sided chest wall pain   7. Thoracic radiculopathy (Left)   8. Pharmacologic therapy   9. Disorder of skeletal system   10. Problems influencing health status    Plan of Care (Initial workup plan)  Note: Mr. Launer was reminded that as per protocol, today's visit has been an evaluation only. We have not taken over the patient's controlled substance management.  Problem-specific plan: No problem-specific Assessment & Plan notes found for this encounter.  Lab Orders         Compliance Drug Analysis, Ur         Comp. Metabolic Panel (12)         Magnesium         Vitamin B12         Sedimentation rate         25-Hydroxy vitamin D Lcms D2+D3         C-reactive protein     Imaging Orders         DG Cervical Spine With Flex & Extend     Referral Orders  No referral(s) requested today   Procedure Orders    No procedure(s) ordered today   Pharmacotherapy (current): Medications ordered:  No orders of the defined types were placed in this encounter.  Medications administered during this visit: Lan Mcneill had no medications administered during this visit.   Pharmacological management options:  Opioid Analgesics: The patient was  informed that there is no guarantee that he would be a candidate for opioid analgesics. The decision will be made following CDC guidelines. This decision will be based on the results of diagnostic studies, as well as Mr. Menken risk profile.   Membrane stabilizer: To be determined at a later time  Muscle relaxant: To be determined at a later time  NSAID: To be determined at a later time  Other analgesic(s): To be determined at a later time   Interventional management options: Mr. Kant was informed that there is no guarantee that he would be a candidate for interventional therapies. The decision will be based on the results of diagnostic studies, as well as Mr. Nifong risk profile.  Procedure(s) under consideration:  Pending results of ordered studies      Interventional Therapies  Risk  Complexity Considerations:   Estimated body mass index is 29.65 kg/m as calculated from the following:   Height as of this encounter: 5' 8"  (1.727 m).   Weight as of this encounter: 195 lb (88.5 kg). Xarelto ANTICOAGULATION: (Stop: 3 days  Restart: 6 hours)   Planned  Pending:   Pending further evaluation   Under consideration:   Consider referral to vascular surgery for further evaluation.   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  Future Appointments  Date Time Provider [place] 12/14/2021 11:00 AM NMilinda Pointer MD [facility] None     Note by: FGaspar Cola MD Date: 11/04/2021; Time: 12:33 PM

## 2021-11-03 DIAGNOSIS — M4310 Spondylolisthesis, site unspecified: Secondary | ICD-10-CM | POA: Insufficient documentation

## 2021-11-03 DIAGNOSIS — M899 Disorder of bone, unspecified: Secondary | ICD-10-CM | POA: Insufficient documentation

## 2021-11-03 DIAGNOSIS — Z79899 Other long term (current) drug therapy: Secondary | ICD-10-CM | POA: Insufficient documentation

## 2021-11-03 DIAGNOSIS — M9904 Segmental and somatic dysfunction of sacral region: Secondary | ICD-10-CM | POA: Insufficient documentation

## 2021-11-03 DIAGNOSIS — M4316 Spondylolisthesis, lumbar region: Secondary | ICD-10-CM | POA: Insufficient documentation

## 2021-11-03 DIAGNOSIS — M4306 Spondylolysis, lumbar region: Secondary | ICD-10-CM | POA: Insufficient documentation

## 2021-11-03 DIAGNOSIS — Z789 Other specified health status: Secondary | ICD-10-CM | POA: Insufficient documentation

## 2021-11-03 DIAGNOSIS — M5136 Other intervertebral disc degeneration, lumbar region: Secondary | ICD-10-CM | POA: Insufficient documentation

## 2021-11-03 DIAGNOSIS — M533 Sacrococcygeal disorders, not elsewhere classified: Secondary | ICD-10-CM | POA: Insufficient documentation

## 2021-11-03 DIAGNOSIS — M461 Sacroiliitis, not elsewhere classified: Secondary | ICD-10-CM | POA: Insufficient documentation

## 2021-11-04 ENCOUNTER — Ambulatory Visit
Admission: RE | Admit: 2021-11-04 | Discharge: 2021-11-04 | Disposition: A | Discharge status: Home / Self Care | Payer: Medicare Other | Attending: Pain Medicine | Admitting: Pain Medicine

## 2021-11-04 ENCOUNTER — Ambulatory Visit
Admission: RE | Admit: 2021-11-04 | Discharge: 2021-11-04 | Disposition: A | Discharge status: Home / Self Care | Payer: Medicare Other | Source: Ambulatory Visit | Attending: Pain Medicine | Admitting: Pain Medicine

## 2021-11-04 ENCOUNTER — Ambulatory Visit (HOSPITAL_BASED_OUTPATIENT_CLINIC_OR_DEPARTMENT_OTHER): Payer: Medicare Other | Admitting: Pain Medicine

## 2021-11-04 ENCOUNTER — Encounter: Payer: Self-pay | Admitting: Pain Medicine

## 2021-11-04 ENCOUNTER — Other Ambulatory Visit: Payer: Self-pay | Admitting: Pain Medicine

## 2021-11-04 VITALS — BP 140/87 | HR 77 | Temp 98.0°F | Ht 68.0 in | Wt 195.0 lb

## 2021-11-04 DIAGNOSIS — M533 Sacrococcygeal disorders, not elsewhere classified: Secondary | ICD-10-CM

## 2021-11-04 DIAGNOSIS — M461 Sacroiliitis, not elsewhere classified: Secondary | ICD-10-CM

## 2021-11-04 DIAGNOSIS — M5412 Radiculopathy, cervical region: Secondary | ICD-10-CM | POA: Insufficient documentation

## 2021-11-04 DIAGNOSIS — I209 Angina pectoris, unspecified: Secondary | ICD-10-CM

## 2021-11-04 DIAGNOSIS — M4306 Spondylolysis, lumbar region: Secondary | ICD-10-CM

## 2021-11-04 DIAGNOSIS — Z789 Other specified health status: Secondary | ICD-10-CM

## 2021-11-04 DIAGNOSIS — G894 Chronic pain syndrome: Secondary | ICD-10-CM | POA: Insufficient documentation

## 2021-11-04 DIAGNOSIS — M5414 Radiculopathy, thoracic region: Secondary | ICD-10-CM

## 2021-11-04 DIAGNOSIS — R079 Chest pain, unspecified: Secondary | ICD-10-CM | POA: Insufficient documentation

## 2021-11-04 DIAGNOSIS — I70218 Atherosclerosis of native arteries of extremities with intermittent claudication, other extremity: Secondary | ICD-10-CM | POA: Insufficient documentation

## 2021-11-04 DIAGNOSIS — G8929 Other chronic pain: Secondary | ICD-10-CM | POA: Insufficient documentation

## 2021-11-04 DIAGNOSIS — M899 Disorder of bone, unspecified: Secondary | ICD-10-CM

## 2021-11-04 DIAGNOSIS — M9904 Segmental and somatic dysfunction of sacral region: Secondary | ICD-10-CM

## 2021-11-04 DIAGNOSIS — Z79899 Other long term (current) drug therapy: Secondary | ICD-10-CM

## 2021-11-04 DIAGNOSIS — M25512 Pain in left shoulder: Secondary | ICD-10-CM | POA: Insufficient documentation

## 2021-11-04 DIAGNOSIS — M5136 Other intervertebral disc degeneration, lumbar region: Secondary | ICD-10-CM

## 2021-11-04 DIAGNOSIS — M4316 Spondylolisthesis, lumbar region: Secondary | ICD-10-CM

## 2021-11-04 DIAGNOSIS — M47812 Spondylosis without myelopathy or radiculopathy, cervical region: Secondary | ICD-10-CM | POA: Diagnosis not present

## 2021-11-04 DIAGNOSIS — E559 Vitamin D deficiency, unspecified: Secondary | ICD-10-CM | POA: Diagnosis not present

## 2021-11-04 DIAGNOSIS — M47816 Spondylosis without myelopathy or radiculopathy, lumbar region: Secondary | ICD-10-CM | POA: Diagnosis not present

## 2021-11-04 DIAGNOSIS — M4310 Spondylolisthesis, site unspecified: Secondary | ICD-10-CM

## 2021-11-04 DIAGNOSIS — M75102 Unspecified rotator cuff tear or rupture of left shoulder, not specified as traumatic: Secondary | ICD-10-CM

## 2021-11-04 DIAGNOSIS — R0789 Other chest pain: Secondary | ICD-10-CM | POA: Insufficient documentation

## 2021-11-04 DIAGNOSIS — M546 Pain in thoracic spine: Secondary | ICD-10-CM | POA: Diagnosis not present

## 2021-11-04 NOTE — Progress Notes (Signed)
Safety precautions to be maintained throughout the outpatient stay will include: orient to surroundings, keep bed in low position, maintain call bell within reach at all times, provide assistance with transfer out of bed and ambulation.  

## 2021-11-07 LAB — COMPLIANCE DRUG ANALYSIS, UR

## 2021-11-10 LAB — COMP. METABOLIC PANEL (12)
AST: 15 IU/L (ref 0–40)
Albumin/Globulin Ratio: 1.5 (ref 1.2–2.2)
Albumin: 4.3 g/dL (ref 3.8–4.8)
Alkaline Phosphatase: 109 IU/L (ref 44–121)
BUN/Creatinine Ratio: 9 — ABNORMAL LOW (ref 10–24)
BUN: 10 mg/dL (ref 8–27)
Bilirubin Total: 0.5 mg/dL (ref 0.0–1.2)
Calcium: 9.6 mg/dL (ref 8.6–10.2)
Chloride: 102 mmol/L (ref 96–106)
Creatinine, Ser: 1.06 mg/dL (ref 0.76–1.27)
Globulin, Total: 2.9 g/dL (ref 1.5–4.5)
Glucose: 144 mg/dL — ABNORMAL HIGH (ref 70–99)
Potassium: 4.8 mmol/L (ref 3.5–5.2)
Sodium: 138 mmol/L (ref 134–144)
Total Protein: 7.2 g/dL (ref 6.0–8.5)
eGFR: 75 mL/min/{1.73_m2} (ref >59)

## 2021-11-10 LAB — C-REACTIVE PROTEIN: CRP: 5 mg/L (ref 0–10)

## 2021-11-10 LAB — 25-HYDROXY VITAMIN D LCMS D2+D3
25-Hydroxy, Vitamin D-2: <1 ng/mL
25-Hydroxy, Vitamin D-3: 16 ng/mL
25-Hydroxy, Vitamin D: 16 ng/mL — ABNORMAL LOW

## 2021-11-10 LAB — SEDIMENTATION RATE: Sed Rate: 35 mm/hr — ABNORMAL HIGH (ref 0–30)

## 2021-11-10 LAB — MAGNESIUM: Magnesium: 2.3 mg/dL (ref 1.6–2.3)

## 2021-11-10 LAB — VITAMIN B12: Vitamin B-12: 248 pg/mL (ref 232–1245)

## 2021-12-02 DIAGNOSIS — E119 Type 2 diabetes mellitus without complications: Secondary | ICD-10-CM | POA: Diagnosis not present

## 2021-12-09 DIAGNOSIS — M542 Cervicalgia: Secondary | ICD-10-CM | POA: Diagnosis not present

## 2021-12-09 DIAGNOSIS — E785 Hyperlipidemia, unspecified: Secondary | ICD-10-CM | POA: Diagnosis not present

## 2021-12-09 DIAGNOSIS — I739 Peripheral vascular disease, unspecified: Secondary | ICD-10-CM | POA: Diagnosis not present

## 2021-12-09 DIAGNOSIS — I10 Essential (primary) hypertension: Secondary | ICD-10-CM | POA: Diagnosis not present

## 2021-12-09 DIAGNOSIS — M5432 Sciatica, left side: Secondary | ICD-10-CM | POA: Diagnosis not present

## 2021-12-09 DIAGNOSIS — B351 Tinea unguium: Secondary | ICD-10-CM | POA: Diagnosis not present

## 2021-12-09 DIAGNOSIS — E119 Type 2 diabetes mellitus without complications: Secondary | ICD-10-CM | POA: Diagnosis not present

## 2021-12-09 DIAGNOSIS — S39012A Strain of muscle, fascia and tendon of lower back, initial encounter: Secondary | ICD-10-CM | POA: Diagnosis not present

## 2021-12-09 DIAGNOSIS — M21612 Bunion of left foot: Secondary | ICD-10-CM | POA: Diagnosis not present

## 2021-12-09 DIAGNOSIS — L600 Ingrowing nail: Secondary | ICD-10-CM | POA: Diagnosis not present

## 2021-12-10 DIAGNOSIS — M5030 Other cervical disc degeneration, unspecified cervical region: Secondary | ICD-10-CM | POA: Insufficient documentation

## 2021-12-10 DIAGNOSIS — G542 Cervical root disorders, not elsewhere classified: Secondary | ICD-10-CM | POA: Insufficient documentation

## 2021-12-10 DIAGNOSIS — M4802 Spinal stenosis, cervical region: Secondary | ICD-10-CM | POA: Insufficient documentation

## 2021-12-10 DIAGNOSIS — M47812 Spondylosis without myelopathy or radiculopathy, cervical region: Secondary | ICD-10-CM | POA: Insufficient documentation

## 2021-12-10 DIAGNOSIS — M5134 Other intervertebral disc degeneration, thoracic region: Secondary | ICD-10-CM | POA: Insufficient documentation

## 2021-12-10 NOTE — Progress Notes (Unsigned)
PROVIDER NOTE: Information contained herein reflects review and annotations entered in association with encounter. Interpretation of such information and data should be left to medically-trained personnel. Information provided to patient can be located elsewhere in the medical record under "Patient Instructions". Document created using STT-dictation technology, any transcriptional errors that may result from process are unintentional.    Patient: Eric Guerrero  Service Category: E/M  Provider: Gaspar Cola, MD  DOB: 30-Jan-1951  DOS: 12/14/2021  Referring Provider: Jodi Marble, MD  MRN: [id number]  Specialty: Interventional Pain Management  PCP: Jodi Marble, MD  Type: Established Patient  Setting: Ambulatory outpatient    Location: Office  Delivery: Face-to-face     Primary Reason(s) for Visit: Encounter for evaluation before starting new chronic pain management plan of care (Level of risk: moderate) CC: Shoulder Pain (left)  HPI  Eric Guerrero is a 71 y.o. year old, male patient, who comes today for a follow-up evaluation to review the test results and decide on a treatment plan. He has Hyperlipidemia; Pain in limb; PAD (peripheral artery disease) ([place]); Chronic low back pain (2ry area of Pain) (Left) w/o sciatica; Chronic hip pain (Left); Chronic sacroiliac joint pain (Left); Chronic shoulder pain (1ry area of Pain) (Left); Rotator cuff tear arthropathy (Left); Chronic pain syndrome; SI joint arthritis (left > right); Lumbar facet arthropathy; Piriformis syndrome (Left); Unstable angina ([place]); Pharmacologic therapy; Disorder of skeletal system; Problems influencing health status; Grade 1 Retrolisthesis of L1/L2 and L2/L3; Pars defect of lumbar spine (Left: L5); Grade 1 Anterolisthesis of lumbar spine (L4/L5); DDD (degenerative disc disease), lumbar; Sacroiliitis (HCC) (Bilateral) (L>R); Sacroiliac joint dysfunction (Left); Somatic dysfunction of sacroiliac joint (Left); Thoracic  radiculopathy (Left); Radicular pain of shoulder (Left); Ischemic chest pain ([place]) (Left); Left-sided chest wall pain; Claudication of left upper extremity due to atherosclerosis ([place]); DDD (degenerative disc disease), cervical; Cervical facet hypertrophy (Multilevel) (Bilateral); Cervical nerve root impingement (C3-C7); Cervical foraminal stenosis (C3-C7); DDD (degenerative disc disease), thoracic; Vitamin D deficiency; and Lumbar facet joint syndrome on their problem list. His primarily concern today is the Shoulder Pain (left)  Pain Assessment: Location: Left Shoulder (left and lower back) Radiating: Denies Onset: More than a month ago Duration: Chronic pain Quality: Aching, Throbbing, Sharp, Shooting Severity: 7 /10 (subjective, self-reported pain score)  Effect on ADL: limits my daily activities Timing: Intermittent Modifying factors: sitting down, no prolonged walking BP: 133/76  HR: 86  [name] comes in today for a follow-up visit after his initial evaluation on 11/04/2021. Today we went over the results of his tests. These were explained in "[place]man's terms". During today's appointment we went over my diagnostic impression, as well as the proposed treatment plan.  Review of initial evaluation (11/04/2021): Prior patient of Dr. Gillis Santa.  "Eric Guerrero is a very pleasant 71 year old male who works at a [employer] who presents with a chief complaint of left posterior shoulder pain as well as left low back pain that does not radiate.  Of note this pain has been present for many years.  Eric Guerrero states that he used to be very physically active and was able to walk 4 to 5 miles a day.  Now he is having left low back pain that limits his ability to bear weight and walk for an extended period of time.  He states that he if he is not exerting himself, his pain is well managed.  He utilizes Celebrex in the morning.  He is also on Xarelto for cardiac disease.  Patient is also a diabetic.  He is  participating in physical therapy which he does not find very helpful in managing his pain.  It has helped with his range of motion.  He is looking forward to getting his quality of life back so that he can participate in hobbies and activities of daily living."  According to the patient the primary area of pain is that of the shoulder (Left) and over arm (posterolateral aspect including the axilla.  The patient indicates that while he is at rest, he has absolutely no pain.  However, when he starts walking, after a while, he will developed this pain which also seems to radiate towards his left chest area, between his nipple area and the clavicle.  He refers that when he develops this pain, he will sit down and rest and this causes the pain to go away.  He denies any pain to the cervical region.  Physical exam today demonstrated the patient to have full range of motion of the cervical spine with no triggering of any pain or discomfort.  He also denies being able to trigger the pain with movement of his left shoulder.  Today he demonstrated full range of motion of the left shoulder with no pain on discomfort upon performing the maneuvers.  He denies ever having had any kind of surgeries of his neck or shoulder, physical therapy, recent x-rays, nerve blocks, or joint injections.  He also denies having had any type of pain, numbness or weakness below the elbow.  He cannot reproduce the pain by movement of the upper extremity.  He refers having seen the cardiologist and having been told that he has no problems.  He indicates that he had a stress test but he also indicated that during the stress test he did not have any of the symptoms.  He refers having seen the cardiologist approximately 6 months ago and he also indicates that it has slowly been getting worse.  He is on Xarelto anticoagulation and has been on it for the past year and a half.  He refers having a history of a left lower extremity DVT.  His record  indicates that he has peripheral vascular disease.  The patient had previously been seen at our facility by Dr. Gillis Santa for pain that he was experiencing in the lower back.  At that time x-rays of the SI joint, lumbar spine, and left hip were done, as well as x-rays of the left shoulder.  On 06/09/2020 he had a left-sided sacroiliac joint injection done which completely eliminated the pain on the left lower back and at this point the patient refers that it has not returned.  In terms of the differential diagnosis, I would have to include the possibility of an intermittent left-sided thoracic radiculitis.  Because the pain seems to be in the cervical thoracic region and following the distribution of the T1/T2 dermatomes, I will be ordering x-rays of the cervical and thoracic spine to evaluate that area.  However, based on the apparent claudication-like characteristics of this pain, it is my impression that this pain is ischemic in nature.  The concern that I have is that he may be cardiogenic.  However, he denies any shortness of breath or any other symptoms that would be typical of angina pectoris and therefore if it is cardiogenic it would be atypical."  Today the patient indicates that his worst pain is that of the left lower back and he wants to address that 1  first.  He denies any lower extremity pain and he refers that the pain worsens as he stands for prolonged period of time.  On 06/09/2020 the patient had a left-sided SI joint and piriformis trigger point injection done which apparently did provide him with good relief of the pain for quite some time.  On his 07/01/2020 follow-up he indicated having attained 100% relief of the pain for the duration of the local anesthetic which then went down to a 90-100% relief that continued for close to a year.  Today I went over the results of his blood work and imaging which revealed that he has liberated sed rate (chronic inflammation) as well as a vitamin D  deficiency.  I have explained to him the relationship of a vitamin D deficiency to his pain and I have ordered replacement therapy which I have sent to his pharmacy.  I have also instructed him to continue taking vitamin D3 once the vitamin D2 is gone.  He has a history of a grade 1 anterolisthesis of the L4 over L5 vertebral body as well as a retrolisthesis of L1 over L2 and L2 over L3, both of which are causing Lumbar facet arthropathy and are believed to be responsible at this time for his left-sided low back pain.  He also has a left-sided L5 pars defect, which also may be contributing to the left-sided facet arthropathy at that level.  In any case, based on those findings, will be scheduling the patient for a diagnostic left-sided lumbar facet block under fluoroscopic guidance.  He is on Xarelto and therefore will need to stop that for 3 days prior to his procedure.  He indicates that he has stopped that safely for all the procedures with no problems.  In considering the treatment plan options, Mr. Corniel was reminded that I no longer take patients for medication management only. I asked him to let me know if he had no intention of taking advantage of the interventional therapies, so that we could make arrangements to provide this space to someone interested. I also made it clear that undergoing interventional therapies for the purpose of getting pain medications is very inappropriate on the part of a patient, and it will not be tolerated in this practice. This type of behavior would suggest true addiction and therefore it requires referral to an addiction specialist.   Further details on both, my assessment(s), as well as the proposed treatment plan, please see below.  Controlled Substance Pharmacotherapy Assessment REMS (Risk Evaluation and Mitigation Strategy)  Opioid Analgesic: None MME/day: 0 mg/day  Pill Count: None expected due to no prior prescriptions written by our practice. Chauncey Fischer,  RN  12/14/2021 11:07 AM  Sign when Signing Visit Safety precautions to be maintained throughout the outpatient stay will include: orient to surroundings, keep bed in low position, maintain call bell within reach at all times, provide assistance with transfer out of bed and ambulation.    Pharmacokinetics: Liberation and absorption (onset of action): WNL Distribution (time to peak effect): WNL Metabolism and excretion (duration of action): WNL         Pharmacodynamics: Desired effects: Analgesia: Mr. Breton reports >50% benefit. Functional ability: Patient reports that medication allows him to accomplish basic ADLs Clinically meaningful improvement in function (CMIF): Sustained CMIF goals met Perceived effectiveness: Described as relatively effective, allowing for increase in activities of daily living (ADL) Undesirable effects: Side-effects or Adverse reactions: None reported Monitoring: [place] PMP: PDMP reviewed during this encounter. Online review  of the past 27-monthperiod previously conducted. Not applicable at this point since we have not taken over the patient's medication management yet. List of other Serum/Urine Drug Screening Test(s):  No results found for: "AMPHSCRSER", "BARBSCRSER", "BENZOSCRSER", "COCAINSCRSER", "COCAINSCRNUR", "PCPSCRSER", "THCSCRSER", "THCU", "CANNABQUANT", "OPIATESCRSER", "OXYSCRSER", "PROPOXSCRSER", "ETH", "CBDTHCR", "D8THCCBX", "D9THCCBX" List of all UDS test(s) done:  Lab Results  Component Value Date   SUMMARY Note 11/04/2021   SUMMARY Note 01/22/2020   Last UDS on record: Summary  Date Value Ref Range Status  11/04/2021 Note  Final    Comment:    ==================================================================== Compliance Drug Analysis, Ur ==================================================================== Test                             Result       Flag       Units  Drug Present not Declared for Prescription Verification   Naproxen                        PRESENT      UNEXPECTED  Drug Absent but Declared for Prescription Verification   Methocarbamol                  Not Detected UNEXPECTED   Diclofenac                     Not Detected UNEXPECTED    Diclofenac, as indicated in the declared medication list, is not    always detected even when used as directed.    Metoprolol                     Not Detected UNEXPECTED ==================================================================== Test                      Result    Flag   Units      Ref Range   Creatinine              65               mg/dL      >=20 ==================================================================== Declared Medications:  The flagging and interpretation on this report are based on the  following declared medications.  Unexpected results may arise from  inaccuracies in the declared medications.   **Note: The testing scope of this panel includes these medications:   Methocarbamol (Robaxin)  Metoprolol (Toprol)   **Note: The testing scope of this panel does not include small to  moderate amounts of these reported medications:   Diclofenac (Voltaren)   **Note: The testing scope of this panel does not include the  following reported medications:   Celecoxib (Celebrex)  Empagliflozin (Synjardy)  Hydrochlorothiazide (Zestoretic)  Isosorbide (Imdur)  Lisinopril (Zestoretic)  Metformin (Synjardy)  Metformin (Glucophage)  Pantoprazole (Protonix)  Rivaroxaban (Xarelto)  Rosuvastatin (Crestor)  Terbinafine (Lamisil) ==================================================================== For clinical consultation, please call ([phone] ====================================================================    UDS interpretation: No unexpected findings.          Medication Assessment Form: Not applicable. No opioids. Treatment compliance: Not applicable Risk Assessment Profile: Aberrant behavior: See initial evaluations. None observed or detected  today Comorbid factors increasing risk of overdose: See initial evaluation. No additional risks detected today Opioid risk tool (ORT):     11/04/2021    9:29 AM  Opioid Risk   Alcohol 0  Illegal Drugs 0  Rx Drugs 0  Alcohol 0  Illegal Drugs 0  Rx Drugs 0  Age between 40-45 years  0  History of Preadolescent Sexual Abuse 0  Psychological Disease 0  Depression 1  Opioid Risk Tool Scoring 1  Opioid Risk Interpretation Low Risk    ORT Scoring interpretation table:  Score <3 = Low Risk for SUD  Score between 4-7 = Moderate Risk for SUD  Score >8 = High Risk for Opioid Abuse   Risk of substance use disorder (SUD): Low  Risk Mitigation Strategies:  Patient opioid safety counseling: No controlled substances prescribed. Patient-Prescriber Agreement (PPA): No agreement signed.  Controlled substance notification to other providers: None required. No opioid therapy.  Pharmacologic Plan: Non-opioid analgesic therapy offered. Interventional alternatives discussed.             Laboratory Chemistry Profile   Renal Lab Results  Component Value Date   BUN 10 11/04/2021   CREATININE 1.06 11/04/2021   BCR 9 (L) 11/04/2021     Electrolytes Lab Results  Component Value Date   NA 138 11/04/2021   K 4.8 11/04/2021   CL 102 11/04/2021   CALCIUM 9.6 11/04/2021   MG 2.3 11/04/2021     Hepatic Lab Results  Component Value Date   AST 15 11/04/2021   ALT 24 05/01/2020   ALBUMIN 4.3 11/04/2021   ALKPHOS 109 11/04/2021     ID No results found for: "LYMEIGGIGMAB", "HIV", "SARSCOV2NAA", "STAPHAUREUS", "MRSAPCR", "HCVAB", "PREGTESTUR", "RMSFIGG", "QFVRPH1IGG", "QFVRPH2IGG"   Bone Lab Results  Component Value Date   25OHVITD1 16 (L) 11/04/2021   25OHVITD2 <1.0 11/04/2021   25OHVITD3 16 11/04/2021     Endocrine Lab Results  Component Value Date   GLUCOSE 144 (H) 11/04/2021     Neuropathy Lab Results  Component Value Date   VITAMINB12 248 11/04/2021     CNS No results  found for: "COLORCSF", "APPEARCSF", "RBCCOUNTCSF", "WBCCSF", "POLYSCSF", "LYMPHSCSF", "EOSCSF", "PROTEINCSF", "GLUCCSF", "JCVIRUS", "CSFOLI", "IGGCSF", "LABACHR", "ACETBL"   Inflammation (CRP: Acute  ESR: Chronic) Lab Results  Component Value Date   CRP 5 11/04/2021   ESRSEDRATE 35 (H) 11/04/2021     Rheumatology No results found for: "RF", "ANA", "LABURIC", "URICUR", "LYMEIGGIGMAB", "LYMEABIGMQN", "HLAB27"   Coagulation Lab Results  Component Value Date   PLT 316 09/11/2020     Cardiovascular Lab Results  Component Value Date   HGB 14.5 09/11/2020   HCT 45.8 09/11/2020     Screening No results found for: "SARSCOV2NAA", "COVIDSOURCE", "STAPHAUREUS", "MRSAPCR", "HCVAB", "HIV", "PREGTESTUR"   Cancer No results found for: "CEA", "CA125", "LABCA2"   Allergens No results found for: "ALMOND", "APPLE", "ASPARAGUS", "AVOCADO", "BANANA", "BARLEY", "BASIL", "BAYLEAF", "GREENBEAN", "LIMABEAN", "WHITEBEAN", "BEEFIGE", "REDBEET", "BLUEBERRY", "BROCCOLI", "CABBAGE", "MELON", "CARROT", "CASEIN", "CASHEWNUT", "CAULIFLOWER", "CELERY"     Note: Lab results reviewed.  Recent Diagnostic Imaging Review  Cervical Imaging: Cervical DG Bending/F/E views: Results for orders placed during the hospital encounter of 11/04/21 DG Cervical Spine With Flex & Extend  Narrative CLINICAL DATA:  Neck pain  EXAM: CERVICAL SPINE COMPLETE WITH FLEXION AND EXTENSION VIEWS  COMPARISON:  None Available.  FINDINGS: No recent fracture is seen. Alignment of the posterior margins of the vertebral bodies is within normal limits. Alignment does not change between the flexion and extension lateral views. Degenerative changes are noted with bony spurs from C3-C7 levels, more so from C4-C7 levels. There is encroachment of neural foramina by bony spurs and facet hypertrophy from C3-C7 levels. Prevertebral soft tissues are unremarkable.  IMPRESSION: No recent fracture is seen. Alignment of posterior margin  of vertebral bodies does not  change between the flexion and extension lateral views. Cervical spondylosis with encroachment of neural foramina from C3-C7 levels.   Electronically Signed By: Elmer Picker M.D. On: 11/04/2021 15:36  Shoulder Imaging: Shoulder-L DG: Results for orders placed during the hospital encounter of 02/13/20 DG Shoulder Left  Narrative CLINICAL DATA:  Left shoulder pain.  No known injury.  EXAM: LEFT SHOULDER - 2+ VIEW  COMPARISON:  None.  FINDINGS: There is no evidence of fracture or dislocation. There is no evidence of arthropathy or other focal bone abnormality. Soft tissues are unremarkable.  IMPRESSION: Normal examination.   Electronically Signed By: Claudie Revering M.D. On: 02/14/2020 14:46  Lumbosacral Imaging: Lumbar DG Bending views: Results for orders placed during the hospital encounter of 02/13/20 DG Lumbar Spine Complete W/Bend  Narrative CLINICAL DATA:  Low back and posterior left hip pain for 2 years.  EXAM: LUMBAR SPINE - COMPLETE WITH BENDING VIEWS  COMPARISON:  None.  FINDINGS: There are 5 non rib-bearing lumbar type vertebrae. There is mild upper lumbar dextroscoliosis. There is trace retrolisthesis of L1 on L2 and L2 on L3, and there is trace anterolisthesis of L4 on L5. No significant change is seen with flexion or extension. There may be a left-sided pars defect at L5 with evaluation for a right-sided defect limited by under penetration on the corresponding oblique radiograph. There is no significant listhesis at L5-S1.  Vertebral body heights are preserved. Moderate endplate spurring and mild-to-moderate disc space narrowing are present at L2-3, and there is milder narrowing and spurring at other levels. Aortic atherosclerosis is noted.  IMPRESSION: 1. Mild-to-moderate lumbar disc degeneration, greatest at L2-3. 2. Mild multilevel degenerative listhesis without evidence of dynamic  instability.   Electronically Signed By: Logan Bores M.D. On: 02/14/2020 14:38        Sacroiliac Joint Imaging: Sacroiliac Joint DG: Results for orders placed during the hospital encounter of 02/13/20 DG Si Joints  Narrative CLINICAL DATA:  Posterior left hip pain for the past 2 years. No known injury.  EXAM: BILATERAL SACROILIAC JOINTS - 3+ VIEW  COMPARISON:  Left hip radiographs obtained at the same time.  FINDINGS: Mild sclerosis on both sides of the left sacroiliac joint and minimal sclerosis on the iliac side of the right sacroiliac joint. Lower lumbar spine degenerative changes.  IMPRESSION: 1. Mild left and minimal right sacroiliitis. 2. Lower lumbar spine degenerative changes.   Electronically Signed By: Claudie Revering M.D. On: 02/14/2020 14:45  Hip Imaging: Hip-L DG 2-3 views: Results for orders placed during the hospital encounter of 02/13/20 DG HIP UNILAT W OR W/O PELVIS 2-3 VIEWS LEFT  Narrative CLINICAL DATA:  Posterior left hip pain for the past 2 years. No known injury.  EXAM: DG HIP (WITH OR WITHOUT PELVIS) 2-3V LEFT  COMPARISON:  None.  FINDINGS: Normal appearing left hip. Mild sclerosis on both sides of the sacroiliac joints bilaterally. Lower lumbar spine degenerative changes and mild scoliosis.  IMPRESSION: 1. Normal appearing left hip. 2. Mild bilateral sacroiliitis. 3. Lower lumbar spine degenerative changes.   Electronically Signed By: Claudie Revering M.D. On: 02/14/2020 14:44  Complexity Note: Imaging results reviewed.                         Meds   Current Outpatient Medications:    Cholecalciferol (VITAMIN D3) 125 MCG (5000 UT) CAPS, Take 1 capsule (5,000 Units total) by mouth daily with breakfast. Take along with calcium and magnesium., Disp: 30 capsule,  Rfl: 2   diclofenac Sodium (VOLTAREN) 1 % GEL, Apply topically 4 (four) times daily., Disp: , Rfl:    Empagliflozin-metFORMIN HCl (SYNJARDY) 07-998 MG TABS, Take 5 mg by  mouth daily., Disp: , Rfl:    ergocalciferol (VITAMIN D2) 1.25 MG (50000 UT) capsule, Take 1 capsule (50,000 Units total) by mouth 2 (two) times a week. X 6 weeks., Disp: 12 capsule, Rfl: 0   methocarbamol (ROBAXIN) 500 MG tablet, Take 1 tablet (500 mg total) by mouth every 12 (twelve) hours as needed for muscle spasms., Disp: 60 tablet, Rfl: 5   pantoprazole (PROTONIX) 40 MG tablet, Take 40 mg by mouth daily., Disp: , Rfl:    rivaroxaban (XARELTO) 2.5 MG TABS tablet, Take 2.5 mg by mouth 2 (two) times daily., Disp: , Rfl:    rosuvastatin (CRESTOR) 10 MG tablet, Take 40 mg by mouth daily., Disp: , Rfl:  No current facility-administered medications for this visit.  Facility-Administered Medications Ordered in Other Visits:    sodium chloride flush (NS) 0.9 % injection 3 mL, 3 mL, Intravenous, Q12H, Dionisio David, MD  ROS  Constitutional: Denies any fever or chills Gastrointestinal: No reported hemesis, hematochezia, vomiting, or acute GI distress Musculoskeletal: Denies any acute onset joint swelling, redness, loss of ROM, or weakness Neurological: No reported episodes of acute onset apraxia, aphasia, dysarthria, agnosia, amnesia, paralysis, loss of coordination, or loss of consciousness  Allergies  Mr. Maynes has No Known Allergies.  [place]  Drug: Mr. Scheuring  reports no history of drug use. Alcohol:  reports no history of alcohol use. Tobacco:  reports that he has never smoked. He has never used smokeless tobacco. Medical:  has a past medical history of Diabetes mellitus without complication ([place]), Hyperlipidemia, and Hypertension. Surgical: Mr. Laurich  has a past surgical history that includes LEFT HEART CATH AND CORONARY ANGIOGRAPHY (Left, 09/11/2020). Family: family history is not on file.  Constitutional Exam  General appearance: Well nourished, well developed, and well hydrated. In no apparent acute distress Vitals:   12/14/21 1107  BP: 133/76  Pulse: 86  Temp: (!) 97.1 F (36.2 C)   SpO2: 98%  Weight: 195 lb (88.5 kg)  Height: 5' 8"  (1.727 m)   BMI Assessment: Estimated body mass index is 29.65 kg/m as calculated from the following:   Height as of this encounter: 5' 8"  (1.727 m).   Weight as of this encounter: 195 lb (88.5 kg).  BMI interpretation table: BMI level Category Range association with higher incidence of chronic pain  <18 kg/m2 Underweight   18.5-24.9 kg/m2 Ideal body weight   25-29.9 kg/m2 Overweight Increased incidence by 20%  30-34.9 kg/m2 Obese (Class I) Increased incidence by 68%  35-39.9 kg/m2 Severe obesity (Class II) Increased incidence by 136%  >40 kg/m2 Extreme obesity (Class III) Increased incidence by 254%   Patient's current BMI Ideal Body weight  Body mass index is 29.65 kg/m. Ideal body weight: 68.4 kg (150 lb 12.7 oz) Adjusted ideal body weight: 76.4 kg (168 lb 7.6 oz)   BMI Readings from Last 4 Encounters:  12/14/21 29.65 kg/m  11/04/21 29.65 kg/m  09/11/20 32.08 kg/m  06/09/20 32.23 kg/m   Wt Readings from Last 4 Encounters:  12/14/21 195 lb (88.5 kg)  11/04/21 195 lb (88.5 kg)  09/11/20 211 lb (95.7 kg)  06/09/20 212 lb (96.2 kg)    Psych/Mental status: Alert, oriented x 3 (person, place, & time)       Eyes: PERLA Respiratory: No evidence of acute respiratory  distress  Assessment & Plan  Primary Diagnosis & Pertinent Problem List: The primary encounter diagnosis was Chronic pain syndrome. Diagnoses of Chronic shoulder pain (1ry area of Pain) (Left), Radicular pain of shoulder (Left), Cervical facet hypertrophy (Multilevel) (Bilateral), Cervical nerve root impingement (C3-C7), Cervical foraminal stenosis (C3-C7), DDD (degenerative disc disease), cervical, Chronic low back pain (2ry area of Pain) (Left) w/o sciatica, DDD (degenerative disc disease), lumbar, Grade 1 Retrolisthesis of L1/L2 and L2/L3, Grade 1 Anterolisthesis of lumbar spine (L4/L5), Pars defect of lumbar spine (Left: L5), DDD (degenerative disc disease),  thoracic, Cervicalgia, Vitamin D deficiency, Lumbar facet arthropathy, and Lumbar facet joint syndrome were also pertinent to this visit.  Visit Diagnosis: 1. Chronic pain syndrome   2. Chronic shoulder pain (1ry area of Pain) (Left)   3. Radicular pain of shoulder (Left)   4. Cervical facet hypertrophy (Multilevel) (Bilateral)   5. Cervical nerve root impingement (C3-C7)   6. Cervical foraminal stenosis (C3-C7)   7. DDD (degenerative disc disease), cervical   8. Chronic low back pain (2ry area of Pain) (Left) w/o sciatica   9. DDD (degenerative disc disease), lumbar   10. Grade 1 Retrolisthesis of L1/L2 and L2/L3   11. Grade 1 Anterolisthesis of lumbar spine (L4/L5)   12. Pars defect of lumbar spine (Left: L5)   13. DDD (degenerative disc disease), thoracic   14. Cervicalgia   15. Vitamin D deficiency   16. Lumbar facet arthropathy   17. Lumbar facet joint syndrome    Problems updated and reviewed during this visit: Problem  Vitamin D Deficiency  Lumbar Facet Joint Syndrome  Lumbar Facet Arthropathy     Plan of Care  Pharmacotherapy (Medications Ordered): Meds ordered this encounter  Medications   ergocalciferol (VITAMIN D2) 1.25 MG (50000 UT) capsule    Sig: Take 1 capsule (50,000 Units total) by mouth 2 (two) times a week. X 6 weeks.    Dispense:  12 capsule    Refill:  0    Fill one day early if pharmacy is closed on scheduled refill date. May substitute for generic, or similar, if available.   Cholecalciferol (VITAMIN D3) 125 MCG (5000 UT) CAPS    Sig: Take 1 capsule (5,000 Units total) by mouth daily with breakfast. Take along with calcium and magnesium.    Dispense:  30 capsule    Refill:  2    Fill 1 day early if pharmacy is closed on scheduled refill date. Generic permitted. Do not send renewal requests.   Procedure Orders         LUMBAR FACET(MEDIAL BRANCH NERVE BLOCK) MBNB     Lab Orders  No laboratory test(s) ordered today   Imaging Orders  No imaging  studies ordered today   Referral Orders  No referral(s) requested today    Pharmacological management options:  Opioid Analgesics: I will not be prescribing any opioids at this time Membrane stabilizer: I will not be prescribing any at this time Muscle relaxant: I will not be prescribing any at this time NSAID: I will not be prescribing any at this time Other analgesic(s): I will not be prescribing any at this time     Interventional Therapies  Risk  Complexity Considerations:   Estimated body mass index is 29.65 kg/m as calculated from the following:   Height as of this encounter: 5' 8"  (1.727 m).   Weight as of this encounter: 195 lb (88.5 kg). Xarelto ANTICOAGULATION: (Stop: 3 days  Restart: 6 hours)   Planned  Pending:   Diagnostic left lumbar facet block #1    Under consideration:   Diagnostic left lumbar facet MBB #1    Completed:   None at this time   Completed by other providers:   Diagnostic left SI joint block x1 (06/09/2020) (100/100/90/90-100) (still gone by 11/04/2021)    Therapeutic  Palliative (PRN) options:   None established    Provider-requested follow-up: Return for procedure (Clinic): (L) L-FCT Blk #1 (NS), (Blood Thinner Protocol). Recent Visits Date Type Provider Dept  11/04/21 Office Visit Milinda Pointer, MD [facility]  Showing recent visits within past 90 days and meeting all other requirements Today's Visits Date Type Provider Dept  12/14/21 Office Visit Milinda Pointer, MD [facility]  Showing today's visits and meeting all other requirements Future Appointments No visits were found meeting these conditions. Showing future appointments within next 90 days and meeting all other requirements  Primary Care Physician: Jodi Marble, MD Note by: Gaspar Cola, MD Date: 12/14/2021; Time: 12:05 PM

## 2021-12-14 ENCOUNTER — Ambulatory Visit: Payer: Medicare Other | Attending: Pain Medicine | Admitting: Pain Medicine

## 2021-12-14 ENCOUNTER — Encounter: Payer: Self-pay | Admitting: Pain Medicine

## 2021-12-14 VITALS — BP 133/76 | HR 86 | Temp 97.1°F | Ht 68.0 in | Wt 195.0 lb

## 2021-12-14 DIAGNOSIS — M545 Low back pain, unspecified: Secondary | ICD-10-CM | POA: Insufficient documentation

## 2021-12-14 DIAGNOSIS — M47816 Spondylosis without myelopathy or radiculopathy, lumbar region: Secondary | ICD-10-CM

## 2021-12-14 DIAGNOSIS — E559 Vitamin D deficiency, unspecified: Secondary | ICD-10-CM | POA: Diagnosis not present

## 2021-12-14 DIAGNOSIS — M4310 Spondylolisthesis, site unspecified: Secondary | ICD-10-CM | POA: Diagnosis not present

## 2021-12-14 DIAGNOSIS — M4316 Spondylolisthesis, lumbar region: Secondary | ICD-10-CM

## 2021-12-14 DIAGNOSIS — M5136 Other intervertebral disc degeneration, lumbar region: Secondary | ICD-10-CM | POA: Diagnosis not present

## 2021-12-14 DIAGNOSIS — G8929 Other chronic pain: Secondary | ICD-10-CM | POA: Diagnosis not present

## 2021-12-14 DIAGNOSIS — M542 Cervicalgia: Secondary | ICD-10-CM | POA: Diagnosis not present

## 2021-12-14 DIAGNOSIS — M5134 Other intervertebral disc degeneration, thoracic region: Secondary | ICD-10-CM

## 2021-12-14 DIAGNOSIS — G894 Chronic pain syndrome: Secondary | ICD-10-CM | POA: Diagnosis not present

## 2021-12-14 DIAGNOSIS — M4802 Spinal stenosis, cervical region: Secondary | ICD-10-CM

## 2021-12-14 DIAGNOSIS — M4306 Spondylolysis, lumbar region: Secondary | ICD-10-CM | POA: Diagnosis not present

## 2021-12-14 DIAGNOSIS — M47812 Spondylosis without myelopathy or radiculopathy, cervical region: Secondary | ICD-10-CM | POA: Diagnosis not present

## 2021-12-14 DIAGNOSIS — M5412 Radiculopathy, cervical region: Secondary | ICD-10-CM | POA: Diagnosis not present

## 2021-12-14 DIAGNOSIS — M5030 Other cervical disc degeneration, unspecified cervical region: Secondary | ICD-10-CM

## 2021-12-14 DIAGNOSIS — M51369 Other intervertebral disc degeneration, lumbar region without mention of lumbar back pain or lower extremity pain: Secondary | ICD-10-CM

## 2021-12-14 DIAGNOSIS — G542 Cervical root disorders, not elsewhere classified: Secondary | ICD-10-CM

## 2021-12-14 DIAGNOSIS — M25512 Pain in left shoulder: Secondary | ICD-10-CM | POA: Insufficient documentation

## 2021-12-14 MED ORDER — ERGOCALCIFEROL 1.25 MG (50000 UT) PO CAPS: 50000.0000 [IU] | ORAL_CAPSULE | ORAL | 0 refills | Status: AC

## 2021-12-14 MED ORDER — VITAMIN D3 125 MCG (5000 UT) PO CAPS: 1.0000 | ORAL_CAPSULE | Freq: Every day | ORAL | 2 refills | Status: AC

## 2021-12-14 NOTE — Progress Notes (Signed)
Safety precautions to be maintained throughout the outpatient stay will include: orient to surroundings, keep bed in low position, maintain call bell within reach at all times, provide assistance with transfer out of bed and ambulation.  

## 2021-12-14 NOTE — Patient Instructions (Signed)
______________________________________________________________________  Preparing for your procedure (without sedation)  Procedure appointments are limited to planned procedures: No Prescription Refills. No disability issues will be discussed. No medication changes will be discussed.  Instructions: Food Intake: Avoid eating anything for at least 4 hours prior to your procedure. Transportation: Unless otherwise stated by your physician, bring a driver. Morning Medicines: Take all of your scheduled morning medications. If you take heart medicine, except for blood thinners, do not forget to take it the morning of the procedure. If your Diastolic (lower reading) is above 100 mmHg, elective cases will be cancelled/rescheduled. Blood thinners: These will need to be stopped for procedures. Notify our staff if you are taking any blood thinners. Depending on which one you take, there will be specific instructions on how and when to stop it. Diabetics on insulin: Notify the staff so that you can be scheduled 1st case in the morning. If your diabetes requires high dose insulin, take only  of your normal insulin dose the morning of the procedure and notify the staff that you have done so. Preventing infections: Shower with an antibacterial soap the morning of your procedure.  Build-up your immune system: Take 1000 mg of Vitamin C with every meal (3 times a day) the day prior to your procedure. Antibiotics: Inform the staff if you have a condition or reason that requires you to take antibiotics before dental procedures. Pregnancy: If you are pregnant, call and cancel the procedure. Sickness: If you have a cold, fever, or any active infections, call and cancel the procedure. Arrival: You must be in the facility at least 30 minutes prior to your scheduled procedure. Children: Do not bring any children with you. Dress appropriately: There is always a possibility that your clothing may get soiled. Valuables:  Do not bring any jewelry or valuables.  Reasons to call and reschedule or cancel your procedure: (Following these recommendations will minimize the risk of a serious complication.) Surgeries: Avoid having procedures within 2 weeks of any surgery. (Avoid for 2 weeks before or after any surgery). Flu Shots: Avoid having procedures within 2 weeks of a flu shots or . (Avoid for 2 weeks before or after immunizations). Barium: Avoid having a procedure within 7-10 days after having had a radiological study involving the use of radiological contrast. (Myelograms, Barium swallow or enema study). Heart attacks: Avoid any elective procedures or surgeries for the initial 6 months after a "Myocardial Infarction" (Heart Attack). Blood thinners: It is imperative that you stop these medications before procedures. Let us know if you if you take any blood thinner.  Infection: Avoid procedures during or within two weeks of an infection (including chest colds or gastrointestinal problems). Symptoms associated with infections include: Localized redness, fever, chills, night sweats or profuse sweating, burning sensation when voiding, cough, congestion, stuffiness, runny nose, sore throat, diarrhea, nausea, vomiting, cold or Flu symptoms, recent or current infections. It is specially important if the infection is over the area that we intend to treat. Heart and lung problems: Symptoms that may suggest an active cardiopulmonary problem include: cough, chest pain, breathing difficulties or shortness of breath, dizziness, ankle swelling, uncontrolled high or unusually low blood pressure, and/or palpitations. If you are experiencing any of these symptoms, cancel your procedure and contact your primary care physician for an evaluation.  Remember:  Regular Business hours are:  Monday to Thursday 8:00 AM to 4:00 PM  Provider's Schedule: Avah Bashor, MD:  Procedure days: Tuesday and Thursday 7:30 AM to 4:00 PM    Bilal  Lateef, MD:  Procedure days: Monday and Wednesday 7:30 AM to 4:00 PM ______________________________________________________________________  ____________________________________________________________________________________________  General Risks and Possible Complications  Patient Responsibilities: It is important that you read this as it is part of your informed consent. It is our duty to inform you of the risks and possible complications associated with treatments offered to you. It is your responsibility as a patient to read this and to ask questions about anything that is not clear or that you believe was not covered in this document.  Patient's Rights: You have the right to refuse treatment. You also have the right to change your mind, even after initially having agreed to have the treatment done. However, under this last option, if you wait until the last second to change your mind, you may be charged for the materials used up to that point.  Introduction: Medicine is not an exact science. Everything in Medicine, including the lack of treatment(s), carries the potential for danger, harm, or loss (which is by definition: Risk). In Medicine, a complication is a secondary problem, condition, or disease that can aggravate an already existing one. All treatments carry the risk of possible complications. The fact that a side effects or complications occurs, does not imply that the treatment was conducted incorrectly. It must be clearly understood that these can happen even when everything is done following the highest safety standards.  No treatment: You can choose not to proceed with the proposed treatment alternative. The "PRO(s)" would include: avoiding the risk of complications associated with the therapy. The "CON(s)" would include: not getting any of the treatment benefits. These benefits fall under one of three categories: diagnostic; therapeutic; and/or palliative. Diagnostic benefits  include: getting information which can ultimately lead to improvement of the disease or symptom(s). Therapeutic benefits are those associated with the successful treatment of the disease. Finally, palliative benefits are those related to the decrease of the primary symptoms, without necessarily curing the condition (example: decreasing the pain from a flare-up of a chronic condition, such as incurable terminal cancer).  General Risks and Complications: These are associated to most interventional treatments. They can occur alone, or in combination. They fall under one of the following six (6) categories: no benefit or worsening of symptoms; bleeding; infection; nerve damage; allergic reactions; and/or death. No benefits or worsening of symptoms: In Medicine there are no guarantees, only probabilities. No healthcare provider can ever guarantee that a medical treatment will work, they can only state the probability that it may. Furthermore, there is always the possibility that the condition may worsen, either directly, or indirectly, as a consequence of the treatment. Bleeding: This is more common if the patient is taking a blood thinner, either prescription or over the counter (example: Goody Powders, Fish oil, Aspirin, Garlic, etc.), or if suffering a condition associated with impaired coagulation (example: Hemophilia, cirrhosis of the liver, low platelet counts, etc.). However, even if you do not have one on these, it can still happen. If you have any of these conditions, or take one of these drugs, make sure to notify your treating physician. Infection: This is more common in patients with a compromised immune system, either due to disease (example: diabetes, cancer, human immunodeficiency virus [HIV], etc.), or due to medications or treatments (example: therapies used to treat cancer and rheumatological diseases). However, even if you do not have one on these, it can still happen. If you have any of these  conditions, or take one of these drugs, make   sure to notify your treating physician. Nerve Damage: This is more common when the treatment is an invasive one, but it can also happen with the use of medications, such as those used in the treatment of cancer. The damage can occur to small secondary nerves, or to large primary ones, such as those in the spinal cord and brain. This damage may be temporary or permanent and it may lead to impairments that can range from temporary numbness to permanent paralysis and/or brain death. Allergic Reactions: Any time a substance or material comes in contact with our body, there is the possibility of an allergic reaction. These can range from a mild skin rash (contact dermatitis) to a severe systemic reaction (anaphylactic reaction), which can result in death. Death: In general, any medical intervention can result in death, most of the time due to an unforeseen complication. ____________________________________________________________________________________________ ____________________________________________________________________________________________  Blood Thinners  IMPORTANT NOTICE:  If you take any of these, make sure to notify the nursing staff.  Failure to do so may result in injury.  Recommended time intervals to stop and restart blood-thinners, before & after invasive procedures  Generic Name Brand Name Pre-procedure. Stop this long before procedure. Post-procedure. Minimum waiting period before restarting.  Abciximab Reopro 15 days 2 hrs  Alteplase Activase 10 days 10 days  Anagrelide Agrylin    Apixaban Eliquis 3 days 6 hrs  Cilostazol Pletal 3 days 5 hrs  Clopidogrel Plavix 7-10 days 2 hrs  Dabigatran Pradaxa 5 days 6 hrs  Dalteparin Fragmin 24 hours 4 hrs  Dipyridamole Aggrenox 11days 2 hrs  Edoxaban Lixiana; Savaysa 3 days 2 hrs  Enoxaparin  Lovenox 24 hours 4 hrs  Eptifibatide Integrillin 8 hours 2 hrs  Fondaparinux  Arixtra 72 hours  12 hrs  Hydroxychloroquine Plaquenil 11 days   Prasugrel Effient 7-10 days 6 hrs  Reteplase Retavase 10 days 10 days  Rivaroxaban Xarelto 3 days 6 hrs  Ticagrelor Brilinta 5-7 days 6 hrs  Ticlopidine Ticlid 10-14 days 2 hrs  Tinzaparin Innohep 24 hours 4 hrs  Tirofiban Aggrastat 8 hours 2 hrs  Warfarin Coumadin 5 days 2 hrs   Other medications with blood-thinning effects  Product indications Generic (Brand) names Note  Cholesterol Lipitor Stop 4 days before procedure  Blood thinner (injectable) Heparin (LMW or LMWH Heparin) Stop 24 hours before procedure  Cancer Ibrutinib (Imbruvica) Stop 7 days before procedure  Malaria/Rheumatoid Hydroxychloroquine (Plaquenil) Stop 11 days before procedure  Thrombolytics  10 days before or after procedures   Over-the-counter (OTC) Products with blood-thinning effects  Product Common names Stop Time  Aspirin > 325 mg Goody Powders, Excedrin, etc. 11 days  Aspirin ? 81 mg  7 days  Fish oil  4 days  Garlic supplements  7 days  Ginkgo biloba  36 hours  Ginseng  24 hours  NSAIDs Ibuprofen, Naprosyn, etc. 3 days  Vitamin E  4 days   ____________________________________________________________________________________________  

## 2021-12-23 DIAGNOSIS — Q828 Other specified congenital malformations of skin: Secondary | ICD-10-CM | POA: Diagnosis not present

## 2021-12-23 DIAGNOSIS — M778 Other enthesopathies, not elsewhere classified: Secondary | ICD-10-CM | POA: Diagnosis not present

## 2021-12-24 ENCOUNTER — Ambulatory Visit: Payer: Medicare Other | Attending: Pain Medicine | Admitting: Pain Medicine

## 2021-12-24 ENCOUNTER — Ambulatory Visit
Admission: RE | Admit: 2021-12-24 | Discharge: 2021-12-24 | Disposition: A | Discharge status: Home / Self Care | Payer: Medicare Other | Source: Ambulatory Visit | Attending: Pain Medicine | Admitting: Pain Medicine

## 2021-12-24 ENCOUNTER — Encounter: Payer: Self-pay | Admitting: Pain Medicine

## 2021-12-24 VITALS — BP 162/96 | HR 76 | Temp 97.3°F | Resp 18 | Ht 68.0 in | Wt 195.0 lb

## 2021-12-24 DIAGNOSIS — G8929 Other chronic pain: Secondary | ICD-10-CM | POA: Diagnosis not present

## 2021-12-24 DIAGNOSIS — M47816 Spondylosis without myelopathy or radiculopathy, lumbar region: Secondary | ICD-10-CM | POA: Diagnosis not present

## 2021-12-24 DIAGNOSIS — M545 Low back pain, unspecified: Secondary | ICD-10-CM | POA: Diagnosis not present

## 2021-12-24 DIAGNOSIS — M5136 Other intervertebral disc degeneration, lumbar region: Secondary | ICD-10-CM | POA: Diagnosis not present

## 2021-12-24 DIAGNOSIS — M4316 Spondylolisthesis, lumbar region: Secondary | ICD-10-CM | POA: Diagnosis not present

## 2021-12-24 DIAGNOSIS — M4306 Spondylolysis, lumbar region: Secondary | ICD-10-CM | POA: Insufficient documentation

## 2021-12-24 DIAGNOSIS — M47817 Spondylosis without myelopathy or radiculopathy, lumbosacral region: Secondary | ICD-10-CM | POA: Insufficient documentation

## 2021-12-24 DIAGNOSIS — M4310 Spondylolisthesis, site unspecified: Secondary | ICD-10-CM | POA: Insufficient documentation

## 2021-12-24 MED ORDER — ROPIVACAINE HCL 2 MG/ML IJ SOLN
INTRAMUSCULAR | Status: AC
  Filled 2021-12-24: qty 20

## 2021-12-24 MED ORDER — TRIAMCINOLONE ACETONIDE 40 MG/ML IJ SUSP
INTRAMUSCULAR | Status: AC
  Filled 2021-12-24: qty 1

## 2021-12-24 MED ORDER — TRIAMCINOLONE ACETONIDE 40 MG/ML IJ SUSP
40.0000 mg | Freq: Once | INTRAMUSCULAR | Status: AC
  Administered 2021-12-24: 40 mg

## 2021-12-24 MED ORDER — LIDOCAINE HCL 2 % IJ SOLN
20.0000 mL | Freq: Once | INTRAMUSCULAR | Status: AC
  Administered 2021-12-24: 400 mg

## 2021-12-24 MED ORDER — ROPIVACAINE HCL 2 MG/ML IJ SOLN
9.0000 mL | Freq: Once | INTRAMUSCULAR | Status: AC
  Administered 2021-12-24: 9 mL via PERINEURAL

## 2021-12-24 MED ORDER — LIDOCAINE HCL 2 % IJ SOLN
INTRAMUSCULAR | Status: AC
  Filled 2021-12-24: qty 20

## 2021-12-24 MED ORDER — PENTAFLUOROPROP-TETRAFLUOROETH EX AERO
INHALATION_SPRAY | Freq: Once | CUTANEOUS | Status: AC
  Administered 2021-12-24: 30 via TOPICAL

## 2021-12-24 NOTE — Progress Notes (Signed)
PROVIDER NOTE: Interpretation of information contained herein should be left to medically-trained personnel. Specific patient instructions are provided elsewhere under "Patient Instructions" section of medical record. This document was created in part using STT-dictation technology, any transcriptional errors that may result from this process are unintentional.  Patient: Eric Guerrero Type: Established DOB: 08-24-1950 MRN: [id number] PCP: Sherron Monday, MD  Service: Procedure DOS: 12/24/2021 Setting: Ambulatory Location: Ambulatory outpatient facility Delivery: Face-to-face Provider: Oswaldo Done, MD Specialty: Interventional Pain Management Specialty designation: 09 Location: Outpatient facility Ref. Prov.: Delano Metz, MD    Procedure:           Type: Lumbar Facet, Medial Branch Block(s) #1  Laterality: Left  Level: L2, L3, L4, L5, & S1 Medial Branch Level(s). Injecting these levels blocks the L3-4, L4-5 and L5-S1 lumbar facet joints. Imaging: Fluoroscopic guidance         Anesthesia: Local anesthesia (1-2% Lidocaine) Anxiolysis: None         Sedation: No Sedation                       DOS: 12/24/2021 Performed by: Oswaldo Done, MD  Primary Purpose: Diagnostic/Therapeutic Indications: Low back pain severe enough to impact quality of life or function. 1. Lumbar facet joint syndrome   2. Grade 1 Retrolisthesis of L1/L2 and L2/L3   3. Grade 1 Anterolisthesis of lumbar spine (L4/L5)   4. Pars defect of lumbar spine (Left: L5)   5. Lumbar facet arthropathy   6. DDD (degenerative disc disease), lumbar   7. Spondylosis without myelopathy or radiculopathy, lumbosacral region   8. Chronic low back pain (2ry area of Pain) (Left) w/o sciatica    NAS-11 Pain score:   Pre-procedure: 7 /10   Post-procedure: 0-No pain/10     Position / Prep / Materials:  Position: Prone  Prep solution: DuraPrep (Iodine Povacrylex [0.7% available iodine] and Isopropyl Alcohol, 74%  w/w) Area Prepped: Posterolateral Lumbosacral Spine (Wide prep: From the lower border of the scapula down to the end of the tailbone and from flank to flank.)  Materials:  Tray: Block Needle(s):  Type: Spinal  Gauge (G): 22  Length: 3.5-in Qty: 4     Pre-op H&P Assessment:  Mr. Baltimore is a 71 y.o. (year old), male patient, seen today for interventional treatment. He  has a past surgical history that includes LEFT HEART CATH AND CORONARY ANGIOGRAPHY (Left, 09/11/2020). Mr. Feldner has a current medication list which includes the following prescription(s): vitamin d3, diclofenac sodium, synjardy, ergocalciferol, methocarbamol, pantoprazole, rosuvastatin, and rivaroxaban, and the following Facility-Administered Medications: sodium chloride flush. His primarily concern today is the Pain (Left buttock)  Initial Vital Signs:  Pulse/HCG Rate: 76ECG Heart Rate: 75 Temp: (!) 97.3 F (36.3 C) Resp: 17 BP: (!) 149/79 SpO2: 100 %  BMI: Estimated body mass index is 29.65 kg/m as calculated from the following:   Height as of this encounter: 5\' 8"  (1.727 m).   Weight as of this encounter: 195 lb (88.5 kg).  Risk Assessment: Allergies: Reviewed. He has No Known Allergies.  Allergy Precautions: None required Coagulopathies: Reviewed. None identified.  Blood-thinner therapy: None at this time Active Infection(s): Reviewed. None identified. Mr. Derks is afebrile  Site Confirmation: Mr. Collister was asked to confirm the procedure and laterality before marking the site Procedure checklist: Completed Consent: Before the procedure and under the influence of no sedative(s), amnesic(s), or anxiolytics, the patient was informed of the treatment options, risks and possible complications. To  fulfill our ethical and legal obligations, as recommended by the American Medical Association's Code of Ethics, I have informed the patient of my clinical impression; the nature and purpose of the treatment or procedure; the risks,  benefits, and possible complications of the intervention; the alternatives, including doing nothing; the risk(s) and benefit(s) of the alternative treatment(s) or procedure(s); and the risk(s) and benefit(s) of doing nothing. The patient was provided information about the general risks and possible complications associated with the procedure. These may include, but are not limited to: failure to achieve desired goals, infection, bleeding, organ or nerve damage, allergic reactions, paralysis, and death. In addition, the patient was informed of those risks and complications associated to Spine-related procedures, such as failure to decrease pain; infection (i.e.: Meningitis, epidural or intraspinal abscess); bleeding (i.e.: epidural hematoma, subarachnoid hemorrhage, or any other type of intraspinal or peri-dural bleeding); organ or nerve damage (i.e.: Any type of peripheral nerve, nerve root, or spinal cord injury) with subsequent damage to sensory, motor, and/or autonomic systems, resulting in permanent pain, numbness, and/or weakness of one or several areas of the body; allergic reactions; (i.e.: anaphylactic reaction); and/or death. Furthermore, the patient was informed of those risks and complications associated with the medications. These include, but are not limited to: allergic reactions (i.e.: anaphylactic or anaphylactoid reaction(s)); adrenal axis suppression; blood sugar elevation that in diabetics may result in ketoacidosis or comma; water retention that in patients with history of congestive heart failure may result in shortness of breath, pulmonary edema, and decompensation with resultant heart failure; weight gain; swelling or edema; medication-induced neural toxicity; particulate matter embolism and blood vessel occlusion with resultant organ, and/or nervous system infarction; and/or aseptic necrosis of one or more joints. Finally, the patient was informed that Medicine is not an exact science;  therefore, there is also the possibility of unforeseen or unpredictable risks and/or possible complications that may result in a catastrophic outcome. The patient indicated having understood very clearly. We have given the patient no guarantees and we have made no promises. Enough time was given to the patient to ask questions, all of which were answered to the patient's satisfaction. Mr. Cure has indicated that he wanted to continue with the procedure. Attestation: I, the ordering provider, attest that I have discussed with the patient the benefits, risks, side-effects, alternatives, likelihood of achieving goals, and potential problems during recovery for the procedure that I have provided informed consent. Date  Time: 12/24/2021  9:47 AM  Pre-Procedure Preparation:  Monitoring: As per clinic protocol. Respiration, ETCO2, SpO2, BP, heart rate and rhythm monitor placed and checked for adequate function Safety Precautions: Patient was assessed for positional comfort and pressure points before starting the procedure. Time-out: I initiated and conducted the "Time-out" before starting the procedure, as per protocol. The patient was asked to participate by confirming the accuracy of the "Time Out" information. Verification of the correct person, site, and procedure were performed and confirmed by me, the nursing staff, and the patient. "Time-out" conducted as per Joint Commission's Universal Protocol (UP.01.01.01). Time: 1008  Description of Procedure:          Laterality: Left Targeted Levels:  L2, L3, L4, L5, & S1 Medial Branch Level(s)  Safety Precautions: Aspiration looking for blood return was conducted prior to all injections. At no point did we inject any substances, as a needle was being advanced. Before injecting, the patient was told to immediately notify me if he was experiencing any new onset of "ringing in the ears, or  metallic taste in the mouth". No attempts were made at seeking any  paresthesias. Safe injection practices and needle disposal techniques used. Medications properly checked for expiration dates. SDV (single dose vial) medications used. After the completion of the procedure, all disposable equipment used was discarded in the proper designated medical waste containers. Local Anesthesia: Protocol guidelines were followed. The patient was positioned over the fluoroscopy table. The area was prepped in the usual manner. The time-out was completed. The target area was identified using fluoroscopy. A 12-in long, straight, sterile hemostat was used with fluoroscopic guidance to locate the targets for each level blocked. Once located, the skin was marked with an approved surgical skin marker. Once all sites were marked, the skin (epidermis, dermis, and hypodermis), as well as deeper tissues (fat, connective tissue and muscle) were infiltrated with a small amount of a short-acting local anesthetic, loaded on a 10cc syringe with a 25G, 1.5-in  Needle. An appropriate amount of time was allowed for local anesthetics to take effect before proceeding to the next step. Local Anesthetic: Lidocaine 2.0% The unused portion of the local anesthetic was discarded in the proper designated containers. Technical description of process:  L2 Medial Branch Nerve Block (MBB): The target area for the L2 medial branch is at the junction of the postero-lateral aspect of the superior articular process and the superior, posterior, and medial edge of the transverse process of L3. Under fluoroscopic guidance, a Quincke needle was inserted until contact was made with os over the superior postero-lateral aspect of the pedicular shadow (target area). After negative aspiration for blood, 0.5 mL of the nerve block solution was injected without difficulty or complication. The needle was removed intact. L3 Medial Branch Nerve Block (MBB): The target area for the L3 medial branch is at the junction of the postero-lateral  aspect of the superior articular process and the superior, posterior, and medial edge of the transverse process of L4. Under fluoroscopic guidance, a Quincke needle was inserted until contact was made with os over the superior postero-lateral aspect of the pedicular shadow (target area). After negative aspiration for blood, 0.5 mL of the nerve block solution was injected without difficulty or complication. The needle was removed intact. L4 Medial Branch Nerve Block (MBB): The target area for the L4 medial branch is at the junction of the postero-lateral aspect of the superior articular process and the superior, posterior, and medial edge of the transverse process of L5. Under fluoroscopic guidance, a Quincke needle was inserted until contact was made with os over the superior postero-lateral aspect of the pedicular shadow (target area). After negative aspiration for blood, 0.5 mL of the nerve block solution was injected without difficulty or complication. The needle was removed intact. L5 Medial Branch Nerve Block (MBB): The target area for the L5 medial branch is at the junction of the postero-lateral aspect of the superior articular process and the superior, posterior, and medial edge of the sacral ala. Under fluoroscopic guidance, a Quincke needle was inserted until contact was made with os over the superior postero-lateral aspect of the pedicular shadow (target area). After negative aspiration for blood, 0.5 mL of the nerve block solution was injected without difficulty or complication. The needle was removed intact. S1 Medial Branch Nerve Block (MBB): The target area for the S1 medial branch is at the posterior and inferior 6 o'clock position of the L5-S1 facet joint. Under fluoroscopic guidance, the Quincke needle inserted for the L5 MBB was redirected until contact was made with os  over the inferior and postero aspect of the sacrum, at the 6 o' clock position under the L5-S1 facet joint (Target area).  After negative aspiration for blood, 0.5 mL of the nerve block solution was injected without difficulty or complication. The needle was removed intact.  Once the entire procedure was completed, the treated area was cleaned, making sure to leave some of the prepping solution back to take advantage of its long term bactericidal properties.         Illustration of the posterior view of the lumbar spine and the posterior neural structures. Laminae of L2 through S1 are labeled. DPRL5, dorsal primary ramus of L5; DPRS1, dorsal primary ramus of S1; DPR3, dorsal primary ramus of L3; FJ, facet (zygapophyseal) joint L3-L4; I, inferior articular process of L4; LB1, lateral branch of dorsal primary ramus of L1; IAB, inferior articular branches from L3 medial branch (supplies L4-L5 facet joint); IBP, intermediate branch plexus; MB3, medial branch of dorsal primary ramus of L3; NR3, third lumbar nerve root; S, superior articular process of L5; SAB, superior articular branches from L4 (supplies L4-5 facet joint also); TP3, transverse process of L3.  Vitals:   12/24/21 0945 12/24/21 1008 12/24/21 1013 12/24/21 1015  BP: (!) 149/79 (!) 153/96 (!) 164/103 (!) 162/96  Pulse: 76     Resp: 17 18 18 18   Temp: (!) 97.3 F (36.3 C)     TempSrc: Temporal     SpO2: 100% 100% 100% 99%  Weight: 195 lb (88.5 kg)     Height: 5\' 8"  (1.727 m)        Start Time: 1008 hrs. End Time: 1015 hrs.  Imaging Guidance (Spinal):          Type of Imaging Technique: Fluoroscopy Guidance (Spinal) Indication(s): Assistance in needle guidance and placement for procedures requiring needle placement in or near specific anatomical locations not easily accessible without such assistance. Exposure Time: Please see nurses notes. Contrast: None used. Fluoroscopic Guidance: I was personally present during the use of fluoroscopy. "Tunnel Vision Technique" used to obtain the best possible view of the target area. Parallax error corrected  before commencing the procedure. "Direction-depth-direction" technique used to introduce the needle under continuous pulsed fluoroscopy. Once target was reached, antero-posterior, oblique, and lateral fluoroscopic projection used confirm needle placement in all planes. Images permanently stored in EMR. Interpretation: No contrast injected. I personally interpreted the imaging intraoperatively. Adequate needle placement confirmed in multiple planes. Permanent images saved into the patient's record.  Antibiotic Prophylaxis:   Anti-infectives (From admission, onward)    None      Indication(s): None identified  Post-operative Assessment:  Post-procedure Vital Signs:  Pulse/HCG Rate: 7669 Temp: (!) 97.3 F (36.3 C) Resp: 18 BP: (!) 162/96 SpO2: 99 %  EBL: None  Complications: No immediate post-treatment complications observed by team, or reported by patient.  Note: The patient tolerated the entire procedure well. A repeat set of vitals were taken after the procedure and the patient was kept under observation following institutional policy, for this type of procedure. Post-procedural neurological assessment was performed, showing return to baseline, prior to discharge. The patient was provided with post-procedure discharge instructions, including a section on how to identify potential problems. Should any problems arise concerning this procedure, the patient was given instructions to immediately contact us, at any time, without hesitation. In any case, we plan to contact the patient by telephone for a follow-up status report regarding this interventional procedure.  Comments:  No additional relevant information.  Plan  of Care  Orders:  Orders Placed This Encounter  Procedures   LUMBAR FACET(MEDIAL BRANCH NERVE BLOCK) MBNB    Scheduling Instructions:     Procedure: Lumbar facet block (AKA.: Lumbosacral medial branch nerve block)     Side: Left-sided     Level: L3-4 & L5-S1 Facets (L2,  L3, L4, L5, & S1 Medial Branch Nerves)     Sedation: Patient's choice.     Timeframe: Today    Order Specific Question:   Where will this procedure be performed?    Answer:   [facility]   DG [facility] C-ARM 1-60 MIN NO REPORT    Intraoperative interpretation by procedural physician at [facility].    Standing Status:   Standing    Number of Occurrences:   1    Order Specific Question:   Reason for exam:    Answer:   Assistance in needle guidance and placement for procedures requiring needle placement in or near specific anatomical locations not easily accessible without such assistance.   Informed Consent Details: Physician/Practitioner Attestation; Transcribe to consent form and obtain patient signature    Nursing Order: Transcribe to consent form and obtain patient signature. Note: Always confirm laterality of pain with Mr. Welton FlakesKhan, before procedure.    Order Specific Question:   Physician/Practitioner attestation of informed consent for procedure/surgical case    Answer:   I, the physician/practitioner, attest that I have discussed with the patient the benefits, risks, side effects, alternatives, likelihood of achieving goals and potential problems during recovery for the procedure that I have provided informed consent.    Order Specific Question:   Procedure    Answer:   Lumbar Facet Block  under fluoroscopic guidance    Order Specific Question:   Physician/Practitioner performing the procedure    Answer:   Cruz Devilla A. Laban EmperorNaveira MD    Order Specific Question:   Indication/Reason    Answer:   Low Back Pain, with our without leg pain, due to Facet Joint Arthralgia (Joint Pain) Spondylosis (Arthritis of the Spine), without myelopathy or radiculopathy (Nerve Damage).   Provide equipment / supplies at bedside    "Block Tray" (Disposable  single use) Needle type: SpinalSpinal Amount/quantity: 4 Size: Regular (3.5-inch) Gauge: 22G    Standing Status:   Standing    Number  of Occurrences:   1    Order Specific Question:   Specify    Answer:   Block Tray   Chronic Opioid Analgesic:  None MME/day: 0 mg/day   Medications ordered for procedure: Meds ordered this encounter  Medications   lidocaine (XYLOCAINE) 2 % (with pres) injection 400 mg   pentafluoroprop-tetrafluoroeth (GEBAUERS) aerosol   ropivacaine (PF) 2 mg/mL (0.2%) (NAROPIN) injection 9 mL   triamcinolone acetonide (KENALOG-40) injection 40 mg   Medications administered: We administered lidocaine, pentafluoroprop-tetrafluoroeth, ropivacaine (PF) 2 mg/mL (0.2%), and triamcinolone acetonide.  See the medical record for exact dosing, route, and time of administration.  Follow-up plan:   Return in about 2 weeks (around 01/07/2022) for Proc-day (T,Th), (F2F), (PPE).       Interventional Therapies  Risk  Complexity Considerations:   Estimated body mass index is 29.65 kg/m as calculated from the following:   Height as of this encounter: 5\' 8"  (1.727 m).   Weight as of this encounter: 195 lb (88.5 kg). Xarelto ANTICOAGULATION: (Stop: 3 days  Restart: 6 hours)   Planned  Pending:   Diagnostic left lumbar facet block #1 (12/24/2021)    Under  consideration:   Diagnostic left lumbar facet MBB #1    Completed:   None at this time   Completed by other providers:   Diagnostic left SI joint block x1 (06/09/2020) (100/100/90/90-100) (still gone by 11/04/2021)    Therapeutic  Palliative (PRN) options:   None established     Recent Visits Date Type Provider Dept  12/14/21 Office Visit Delano Metz, MD [facility]  11/04/21 Office Visit Delano Metz, MD [facility]  Showing recent visits within past 90 days and meeting all other requirements Today's Visits Date Type Provider Dept  12/24/21 Procedure visit Delano Metz, MD [facility]  Showing today's visits and meeting all other requirements Future Appointments Date Type Provider Dept  01/12/22  Appointment Delano Metz, MD [facility]  Showing future appointments within next 90 days and meeting all other requirements  Disposition: Discharge home  Discharge (Date  Time): 12/24/2021; 1025 hrs.   Primary Care Physician: Sherron Monday, MD Location: [facility] Note by: Oswaldo Done, MD Date: 12/24/2021; Time: 10:44 AM  Disclaimer:  Medicine is not an Visual merchandiser. The only guarantee in medicine is that nothing is guaranteed. It is important to note that the decision to proceed with this intervention was based on the information collected from the patient. The Data and conclusions were drawn from the patient's questionnaire, the interview, and the physical examination. Because the information was provided in large part by the patient, it cannot be guaranteed that it has not been purposely or unconsciously manipulated. Every effort has been made to obtain as much relevant data as possible for this evaluation. It is important to note that the conclusions that lead to this procedure are derived in large part from the available data. Always take into account that the treatment will also be dependent on availability of resources and existing treatment guidelines, considered by other Pain Management Practitioners as being common knowledge and practice, at the time of the intervention. For Medico-Legal purposes, it is also important to point out that variation in procedural techniques and pharmacological choices are the acceptable norm. The indications, contraindications, technique, and results of the above procedure should only be interpreted and judged by a Board-Certified Interventional Pain Specialist with extensive familiarity and expertise in the same exact procedure and technique.

## 2021-12-24 NOTE — Patient Instructions (Addendum)
You may go back on your Xarelto (blood thinner) no sooner than 6 hours after the procedure. ____________________________________________________________________________________________  Blood Thinners  IMPORTANT NOTICE:  If you take any of these, make sure to notify the nursing staff.  Failure to do so may result in injury.  Recommended time intervals to stop and restart blood-thinners, before & after invasive procedures  Generic Name Brand Name Pre-procedure. Stop this long before procedure. Post-procedure. Minimum waiting period before restarting.  Abciximab Reopro 15 days 2 hrs  Alteplase Activase 10 days 10 days  Anagrelide Agrylin    Apixaban Eliquis 3 days 6 hrs  Cilostazol Pletal 3 days 5 hrs  Clopidogrel Plavix 7-10 days 2 hrs  Dabigatran Pradaxa 5 days 6 hrs  Dalteparin Fragmin 24 hours 4 hrs  Dipyridamole Aggrenox 11days 2 hrs  Edoxaban Lixiana; Savaysa 3 days 2 hrs  Enoxaparin  Lovenox 24 hours 4 hrs  Eptifibatide Integrillin 8 hours 2 hrs  Fondaparinux  Arixtra 72 hours 12 hrs  Hydroxychloroquine Plaquenil 11 days   Prasugrel Effient 7-10 days 6 hrs  Reteplase Retavase 10 days 10 days  Rivaroxaban Xarelto 3 days 6 hrs  Ticagrelor Brilinta 5-7 days 6 hrs  Ticlopidine Ticlid 10-14 days 2 hrs  Tinzaparin Innohep 24 hours 4 hrs  Tirofiban Aggrastat 8 hours 2 hrs  Warfarin Coumadin 5 days 2 hrs   Other medications with blood-thinning effects  Product indications Generic (Brand) names Note  Cholesterol Lipitor Stop 4 days before procedure  Blood thinner (injectable) Heparin (LMW or LMWH Heparin) Stop 24 hours before procedure  Cancer Ibrutinib (Imbruvica) Stop 7 days before procedure  Malaria/Rheumatoid Hydroxychloroquine (Plaquenil) Stop 11 days before procedure  Thrombolytics  10 days before or after procedures   Over-the-counter (OTC) Products with blood-thinning effects  Product Common names Stop Time  Aspirin > 325 mg Goody Powders, Excedrin, etc. 11 days   Aspirin ? 81 mg  7 days  Fish oil  4 days  Garlic supplements  7 days  Ginkgo biloba  36 hours  Ginseng  24 hours  NSAIDs Ibuprofen, Naprosyn, etc. 3 days  Vitamin E  4 days   ____________________________________________________________________________________________  ____________________________________________________________________________________________  Post-Procedure Discharge Instructions  Instructions: Apply ice:  Purpose: This will minimize any swelling and discomfort after procedure.  When: Day of procedure, as soon as you get home. How: Fill a plastic sandwich bag with crushed ice. Cover it with a small towel and apply to injection site. How long: (15 min on, 15 min off) Apply for 15 minutes then remove x 15 minutes.  Repeat sequence on day of procedure, until you go to bed. Apply heat:  Purpose: To treat any soreness and discomfort from the procedure. When: Starting the next day after the procedure. How: Apply heat to procedure site starting the day following the procedure. How long: May continue to repeat daily, until discomfort goes away. Food intake: Start with clear liquids (like water) and advance to regular food, as tolerated.  Physical activities: Keep activities to a minimum for the first 8 hours after the procedure. After that, then as tolerated. Driving: If you have received any sedation, be responsible and do not drive. You are not allowed to drive for 24 hours after having sedation. Blood thinner: (Applies only to those taking blood thinners) You may restart your blood thinner 6 hours after your procedure. Insulin: (Applies only to Diabetic patients taking insulin) As soon as you can eat, you may resume your normal dosing schedule. Infection prevention: Keep procedure site clean  and dry. Shower daily and clean area with soap and water. Post-procedure Pain Diary: Extremely important that this be done correctly and accurately. Recorded information will be used  to determine the next step in treatment. For the purpose of accuracy, follow these rules: Evaluate only the area treated. Do not report or include pain from an untreated area. For the purpose of this evaluation, ignore all other areas of pain, except for the treated area. After your procedure, avoid taking a long nap and attempting to complete the pain diary after you wake up. Instead, set your alarm clock to go off every hour, on the hour, for the initial 8 hours after the procedure. Document the duration of the numbing medicine, and the relief you are getting from it. Do not go to sleep and attempt to complete it later. It will not be accurate. If you received sedation, it is likely that you were given a medication that may cause amnesia. Because of this, completing the diary at a later time may cause the information to be inaccurate. This information is needed to plan your care. Follow-up appointment: Keep your post-procedure follow-up evaluation appointment after the procedure (usually 2 weeks for most procedures, 6 weeks for radiofrequencies). DO NOT FORGET to bring you pain diary with you.   Expect: (What should I expect to see with my procedure?) From numbing medicine (AKA: Local Anesthetics): Numbness or decrease in pain. You may also experience some weakness, which if present, could last for the duration of the local anesthetic. Onset: Full effect within 15 minutes of injected. Duration: It will depend on the type of local anesthetic used. On the average, 1 to 8 hours.  From steroids (Applies only if steroids were used): Decrease in swelling or inflammation. Once inflammation is improved, relief of the pain will follow. Onset of benefits: Depends on the amount of swelling present. The more swelling, the longer it will take for the benefits to be seen. In some cases, up to 10 days. Duration: Steroids will stay in the system x 2 weeks. Duration of benefits will depend on multiple posibilities  including persistent irritating factors. Side-effects: If present, they may typically last 2 weeks (the duration of the steroids). Frequent: Cramps (if they occur, drink Gatorade and take over-the-counter Magnesium 450-500 mg once to twice a day); water retention with temporary weight gain; increases in blood sugar; decreased immune system response; increased appetite. Occasional: Facial flushing (red, warm cheeks); mood swings; menstrual changes. Uncommon: Long-term decrease or suppression of natural hormones; bone thinning. (These are more common with higher doses or more frequent use. This is why we prefer that our patients avoid having any injection therapies in other practices.)  Very Rare: Severe mood changes; psychosis; aseptic necrosis. From procedure: Some discomfort is to be expected once the numbing medicine wears off. This should be minimal if ice and heat are applied as instructed.  Call if: (When should I call?) You experience numbness and weakness that gets worse with time, as opposed to wearing off. New onset bowel or bladder incontinence. (Applies only to procedures done in the spine)  Emergency Numbers: Durning business hours (Monday - Thursday, 8:00 AM - 4:00 PM) (Friday, 9:00 AM - 12:00 Noon): [phone] After hours: [phone] NOTE: If you are having a problem and are unable connect with, or to talk to a provider, then go to your nearest urgent care or emergency department. If the problem is serious and urgent, please call 911. ____________________________________________________________________________________________  ____________________________________________________________________________________________  Muscle  Spasms & Cramps  Cause(s):  Most common - vitamin and/or electrolyte (calcium, potassium, sodium, etc.) deficiencies. Post procedure - steroids can make your kidneys excrete electrolytes. If you happen to have been borderline low on your  electrolytes, it may temporarily triggering cramps & spasms.  Possible triggers: Sweating - causes loss of electrolytes thru the skin. Steroids - causes loss of electrolytes thru the urine.  Treatment: Gatorade (or any other electrolyte-replenishing drink) - Take 1, 8 oz glass with each meal (3 times a day). OTC (over-the-counter) Magnesium 400 to 500 mg - Take 1 tablet twice a day (one with breakfast and one before bedtime). If you have kidney problems, talk to your primary care physician before taking any Magnesium. Tonic Water with quinine - Take 1, 8 oz glass before bedtime.   ____________________________________________________________________________________________     Pain Management Discharge Instructions  General Discharge Instructions :  If you need to reach your doctor call: Monday-Friday 8:00 am - 4:00 pm at [phone] or toll free [phone].  After clinic hours [phone] to have operator reach doctor.  Bring all of your medication bottles to all your appointments in the pain clinic.  To cancel or reschedule your appointment with Pain Management please remember to call 24 hours in advance to avoid a fee.  Refer to the educational materials which you have been given on: General Risks, I had my Procedure. Discharge Instructions, Post Sedation.  Post Procedure Instructions:  The drugs you were given will stay in your system until tomorrow, so for the next 24 hours you should not drive, make any legal decisions or drink any alcoholic beverages.  You may eat anything you prefer, but it is better to start with liquids then soups and crackers, and gradually work up to solid foods.  Please notify your doctor immediately if you have any unusual bleeding, trouble breathing or pain that is not related to your normal pain.  Depending on the type of procedure that was done, some parts of your body may feel week and/or numb.  This usually clears up by tonight or the next  day.  Walk with the use of an assistive device or accompanied by an adult for the 24 hours.  You may use ice on the affected area for the first 24 hours.  Put ice in a Ziploc bag and cover with a towel and place against area 15 minutes on 15 minutes off.  You may switch to heat after 24 hours.Facet Blocks Patient Information  Description: The facets are joints in the spine between the vertebrae.  Like any joints in the body, facets can become irritated and painful.  Arthritis can also effect the facets.  By injecting steroids and local anesthetic in and around these joints, we can temporarily block the nerve supply to them.  Steroids act directly on irritated nerves and tissues to reduce selling and inflammation which often leads to decreased pain.  Facet blocks may be done anywhere along the spine from the neck to the low back depending upon the location of your pain.   After numbing the skin with local anesthetic (like Novocaine), a small needle is passed onto the facet joints under x-ray guidance.  You may experience a sensation of pressure while this is being done.  The entire block usually lasts about 15-25 minutes.   Conditions which may be treated by facet blocks:  Low back/buttock pain Neck/shoulder pain Certain types of headaches  Preparation for the injection:  Do not eat any solid food  or dairy products within 8 hours of your appointment. You may drink clear liquid up to 3 hours before appointment.  Clear liquids include water, black coffee, juice or soda.  No milk or cream please. You may take your regular medication, including pain medications, with a sip of water before your appointment.  Diabetics should hold regular insulin (if taken separately) and take 1/2 normal NPH dose the morning of the procedure.  Carry some sugar containing items with you to your appointment. A driver must accompany you and be prepared to drive you home after your procedure. Bring all your current  medications with you. An IV may be inserted and sedation may be given at the discretion of the physician. A blood pressure cuff, EKG and other monitors will often be applied during the procedure.  Some patients may need to have extra oxygen administered for a short period. You will be asked to provide medical information, including your allergies and medications, prior to the procedure.  We must know immediately if you are taking blood thinners (like Coumadin/Warfarin) or if you are allergic to IV iodine contrast (dye).  We must know if you could possible be pregnant.  Possible side-effects:  Bleeding from needle site Infection (rare, may require surgery) Nerve injury (rare) Numbness & tingling (temporary) Difficulty urinating (rare, temporary) Spinal headache (a headache worse with upright posture) Light-headedness (temporary) Pain at injection site (serveral days) Decreased blood pressure (rare, temporary) Weakness in arm/leg (temporary) Pressure sensation in back/neck (temporary)   Call if you experience:  Fever/chills associated with headache or increased back/neck pain Headache worsened by an upright position New onset, weakness or numbness of an extremity below the injection site Hives or difficulty breathing (go to the emergency room) Inflammation or drainage at the injection site(s) Severe back/neck pain greater than usual New symptoms which are concerning to you  Please note:  Although the local anesthetic injected can often make your back or neck feel good for several hours after the injection, the pain will likely return. It takes 3-7 days for steroids to work.  You may not notice any pain relief for at least one week.  If effective, we will often do a series of 2-3 injections spaced 3-6 weeks apart to maximally decrease your pain.  After the initial series, you may be a candidate for a more permanent nerve block of the facets.  If you have any questions, please call  #[phone]) [phone] [facility]

## 2021-12-24 NOTE — Progress Notes (Signed)
Safety precautions to be maintained throughout the outpatient stay will include: orient to surroundings, keep bed in low position, maintain call bell within reach at all times, provide assistance with transfer out of bed and ambulation.  

## 2021-12-25 ENCOUNTER — Telehealth: Payer: Self-pay

## 2021-12-25 NOTE — Telephone Encounter (Signed)
Called PP. No answer. Left msg to call if needed.

## 2022-01-09 NOTE — Progress Notes (Unsigned)
PROVIDER NOTE: Information contained herein reflects review and annotations entered in association with encounter. Interpretation of such information and data should be left to medically-trained personnel. Information provided to patient can be located elsewhere in the medical record under "Patient Instructions". Document created using STT-dictation technology, any transcriptional errors that may result from process are unintentional.    Patient: Eric Guerrero  Service Category: E/M  Provider: Gaspar Cola, MD  DOB: 1950/09/04  DOS: 01/12/2022  Referring Provider: Jodi Marble, MD  MRN: [id number]  Specialty: Interventional Pain Management  PCP: Jodi Marble, MD  Type: Established Patient  Setting: Ambulatory outpatient    Location: Office  Delivery: Face-to-face     HPI  [name], a 71 y.o. year old male, is here today because of his No primary diagnosis found.. Eric Guerrero primary complain today is No chief complaint on file. Last encounter: My last encounter with him was on 12/24/2021. Pertinent problems: Eric Guerrero has Pain in limb; Chronic low back pain (2ry area of Pain) (Left) w/o sciatica; Chronic hip pain (Left); Chronic sacroiliac joint pain (Left); Chronic shoulder pain (1ry area of Pain) (Left); Rotator cuff tear arthropathy (Left); Chronic pain syndrome; SI joint arthritis (left > right); Lumbar facet arthropathy; Piriformis syndrome (Left); Grade 1 Retrolisthesis of L1/L2 and L2/L3; Pars defect of lumbar spine (Left: L5); Grade 1 Anterolisthesis of lumbar spine (L4/L5); DDD (degenerative disc disease), lumbar; Sacroiliitis (HCC) (Bilateral) (L>R); Sacroiliac joint dysfunction (Left); Somatic dysfunction of sacroiliac joint (Left); Thoracic radiculopathy (Left); Radicular pain of shoulder (Left); Ischemic chest pain ([place]) (Left); Left-sided chest wall pain; Claudication of left upper extremity due to atherosclerosis ([place]); DDD (degenerative disc disease), cervical;  Cervical facet hypertrophy (Multilevel) (Bilateral); Cervical nerve root impingement (C3-C7); Cervical foraminal stenosis (C3-C7); DDD (degenerative disc disease), thoracic; Vitamin D deficiency; Lumbar facet joint syndrome; and Spondylosis without myelopathy or radiculopathy, lumbosacral region on their pertinent problem list. Pain Assessment: Severity of   is reported as a  /10. Location:    / . Onset:  . Quality:  . Timing:  . Modifying factor(s):  Marland Kitchen Vitals:  vitals were not taken for this visit.   Reason for encounter: post-procedure evaluation and assessment. ***  Post-procedure evaluation   Type: Lumbar Facet, Medial Branch Block(s) #1  Laterality: Left  Level: L2, L3, L4, L5, & S1 Medial Branch Level(s). Injecting these levels blocks the L3-4, L4-5 and L5-S1 lumbar facet joints. Imaging: Fluoroscopic guidance         Anesthesia: Local anesthesia (1-2% Lidocaine) Anxiolysis: None         Sedation: No Sedation                       DOS: 12/24/2021 Performed by: Gaspar Cola, MD  Primary Purpose: Diagnostic/Therapeutic Indications: Low back pain severe enough to impact quality of life or function. 1. Lumbar facet joint syndrome   2. Grade 1 Retrolisthesis of L1/L2 and L2/L3   3. Grade 1 Anterolisthesis of lumbar spine (L4/L5)   4. Pars defect of lumbar spine (Left: L5)   5. Lumbar facet arthropathy   6. DDD (degenerative disc disease), lumbar   7. Spondylosis without myelopathy or radiculopathy, lumbosacral region   8. Chronic low back pain (2ry area of Pain) (Left) w/o sciatica    NAS-11 Pain score:   Pre-procedure: 7 /10   Post-procedure: 0-No pain/10      Effectiveness:  Initial hour after procedure:   ***. Subsequent 4-6 hours post-procedure:   ***.  Analgesia past initial 6 hours:   ***. Ongoing improvement:  Analgesic:  *** Function:    ***    ROM:    ***     Pharmacotherapy Assessment  Analgesic: None MME/day: 0 mg/day   Monitoring: [place] PMP: PDMP  reviewed during this encounter.       Pharmacotherapy: No side-effects or adverse reactions reported. Compliance: No problems identified. Effectiveness: Clinically acceptable.  No notes on file  No results found for: "CBDTHCR" No results found for: "D8THCCBX" No results found for: "D9THCCBX"  UDS:  Summary  Date Value Ref Range Status  11/04/2021 Note  Final    Comment:    ==================================================================== Compliance Drug Analysis, Ur ==================================================================== Test                             Result       Flag       Units  Drug Present not Declared for Prescription Verification   Naproxen                       PRESENT      UNEXPECTED  Drug Absent but Declared for Prescription Verification   Methocarbamol                  Not Detected UNEXPECTED   Diclofenac                     Not Detected UNEXPECTED    Diclofenac, as indicated in the declared medication list, is not    always detected even when used as directed.    Metoprolol                     Not Detected UNEXPECTED ==================================================================== Test                      Result    Flag   Units      Ref Range   Creatinine              65               mg/dL      >=20 ==================================================================== Declared Medications:  The flagging and interpretation on this report are based on the  following declared medications.  Unexpected results may arise from  inaccuracies in the declared medications.   **Note: The testing scope of this panel includes these medications:   Methocarbamol (Robaxin)  Metoprolol (Toprol)   **Note: The testing scope of this panel does not include small to  moderate amounts of these reported medications:   Diclofenac (Voltaren)   **Note: The testing scope of this panel does not include the  following reported medications:   Celecoxib  (Celebrex)  Empagliflozin (Synjardy)  Hydrochlorothiazide (Zestoretic)  Isosorbide (Imdur)  Lisinopril (Zestoretic)  Metformin (Synjardy)  Metformin (Glucophage)  Pantoprazole (Protonix)  Rivaroxaban (Xarelto)  Rosuvastatin (Crestor)  Terbinafine (Lamisil) ==================================================================== For clinical consultation, please call [phone]. ====================================================================       ROS  Constitutional: Denies any fever or chills Gastrointestinal: No reported hemesis, hematochezia, vomiting, or acute GI distress Musculoskeletal: Denies any acute onset joint swelling, redness, loss of ROM, or weakness Neurological: No reported episodes of acute onset apraxia, aphasia, dysarthria, agnosia, amnesia, paralysis, loss of coordination, or loss of consciousness  Medication Review  Empagliflozin-metFORMIN HCl, Vitamin D3, diclofenac Sodium, ergocalciferol, methocarbamol, pantoprazole, rivaroxaban, and rosuvastatin  History Review  Allergy:  [name] has No Known Allergies. Drug: [name]  reports no history of drug use. Alcohol:  reports no history of alcohol use. Tobacco:  reports that he has never smoked. He has never used smokeless tobacco. Social: [name]  reports that he has never smoked. He has never used smokeless tobacco. He reports that he does not drink alcohol and does not use drugs. Medical:  has a past medical history of Diabetes mellitus without complication ([place]), Hyperlipidemia, and Hypertension. Surgical: [name]  has a past surgical history that includes LEFT HEART CATH AND CORONARY ANGIOGRAPHY (Left, 09/11/2020). Family: family history is not on file.  Laboratory Chemistry Profile   Renal Lab Results  Component Value Date   BUN 10 11/04/2021   CREATININE 1.06 11/04/2021   BCR 9 (L) 11/04/2021    Hepatic Lab Results  Component Value Date   AST 15 11/04/2021   ALT 24 05/01/2020   ALBUMIN  4.3 11/04/2021   ALKPHOS 109 11/04/2021    Electrolytes Lab Results  Component Value Date   NA 138 11/04/2021   K 4.8 11/04/2021   CL 102 11/04/2021   CALCIUM 9.6 11/04/2021   MG 2.3 11/04/2021    Bone Lab Results  Component Value Date   25OHVITD1 16 (L) 11/04/2021   25OHVITD2 <1.0 11/04/2021   25OHVITD3 16 11/04/2021    Inflammation (CRP: Acute Phase) (ESR: Chronic Phase) Lab Results  Component Value Date   CRP 5 11/04/2021   ESRSEDRATE 35 (H) 11/04/2021         Note: Above Lab results reviewed.  Recent Imaging Review  DG PAIN CLINIC C-ARM 1-60 MIN NO REPORT Fluoro was used, but no Radiologist interpretation will be provided.  Please refer to "NOTES" tab for provider progress note. Note: Reviewed        Physical Exam  General appearance: Well nourished, well developed, and well hydrated. In no apparent acute distress Mental status: Alert, oriented x 3 (person, place, & time)       Respiratory: No evidence of acute respiratory distress Eyes: PERLA Vitals: There were no vitals taken for this visit. BMI: Estimated body mass index is 29.65 kg/m as calculated from the following:   Height as of 12/24/21: 5' 8"  (1.727 m).   Weight as of 12/24/21: 195 lb (88.5 kg). Ideal: Patient weight not recorded  Assessment   Diagnosis Status  No diagnosis found. Controlled Controlled Controlled   Updated Problems: No problems updated.   Plan of Care  Problem-specific:  No problem-specific Assessment & Plan notes found for this encounter.  Mr. Aman Eric Guerrero has a current medication list which includes the following long-term medication(s): pantoprazole, rivaroxaban, and rosuvastatin.  Pharmacotherapy (Medications Ordered): No orders of the defined types were placed in this encounter.  Orders:  No orders of the defined types were placed in this encounter.  Follow-up plan:   No follow-ups on file.     Interventional Therapies  Risk  Complexity Considerations:    Estimated body mass index is 29.65 kg/m as calculated from the following:   Height as of this encounter: 5' 8"  (1.727 m).   Weight as of this encounter: 195 lb (88.5 kg). Xarelto ANTICOAGULATION: (Stop: 3 days  Restart: 6 hours)   Planned  Pending:   Diagnostic left lumbar facet block #1 (12/24/2021)    Under consideration:   Diagnostic left lumbar facet MBB #1    Completed:   None at this time   Completed by other providers:   Diagnostic left  SI joint block x1 (06/09/2020) (100/100/90/90-100) (still gone by 11/04/2021)    Therapeutic  Palliative (PRN) options:   None established      Recent Visits Date Type Provider Dept  12/24/21 Procedure visit Milinda Pointer, MD [facility]  12/14/21 Office Visit Milinda Pointer, MD [facility]  11/04/21 Office Visit Milinda Pointer, MD [facility]  Showing recent visits within past 90 days and meeting all other requirements Future Appointments Date Type Provider Dept  01/12/22 Appointment Milinda Pointer, MD [facility]  Showing future appointments within next 90 days and meeting all other requirements  I discussed the assessment and treatment plan with the patient. The patient was provided an opportunity to ask questions and all were answered. The patient agreed with the plan and demonstrated an understanding of the instructions.  Patient advised to call back or seek an in-person evaluation if the symptoms or condition worsens.  Duration of encounter: *** minutes.  Total time on encounter, as per AMA guidelines included both the face-to-face and non-face-to-face time personally spent by the physician and/or other qualified health care professional(s) on the day of the encounter (includes time in activities that require the physician or other qualified health care professional and does not include time in activities normally performed by clinical staff). Physician's time may include the  following activities when performed: preparing to see the patient (eg, review of tests, pre-charting review of records) obtaining and/or reviewing separately obtained history performing a medically appropriate examination and/or evaluation counseling and educating the patient/family/caregiver ordering medications, tests, or procedures referring and communicating with other health care professionals (when not separately reported) documenting clinical information in the electronic or other health record independently interpreting results (not separately reported) and communicating results to the patient/ family/caregiver care coordination (not separately reported)  Note by: Gaspar Cola, MD Date: 01/12/2022; Time: 8:54 AM

## 2022-01-12 ENCOUNTER — Ambulatory Visit: Payer: Medicare Other | Attending: Pain Medicine | Admitting: Pain Medicine

## 2022-01-12 ENCOUNTER — Encounter: Payer: Self-pay | Admitting: Pain Medicine

## 2022-01-12 VITALS — BP 136/93 | HR 74 | Temp 97.5°F | Resp 16 | Ht 68.0 in | Wt 195.0 lb

## 2022-01-12 DIAGNOSIS — M47816 Spondylosis without myelopathy or radiculopathy, lumbar region: Secondary | ICD-10-CM | POA: Diagnosis not present

## 2022-01-12 DIAGNOSIS — M4306 Spondylolysis, lumbar region: Secondary | ICD-10-CM | POA: Diagnosis not present

## 2022-01-12 DIAGNOSIS — M545 Low back pain, unspecified: Secondary | ICD-10-CM | POA: Diagnosis not present

## 2022-01-12 DIAGNOSIS — G8929 Other chronic pain: Secondary | ICD-10-CM | POA: Diagnosis not present

## 2022-01-12 IMAGING — CR DG SI JOINTS 3+V
3 series · 3 of 3 positions shown · non-contrast
Comparison: Left hip radiographs obtained at the same time.

CLINICAL DATA: Posterior left hip pain for the past 2 years. No
known injury.

EXAM:
BILATERAL SACROILIAC JOINTS - 3+ VIEW

[si joints ap]
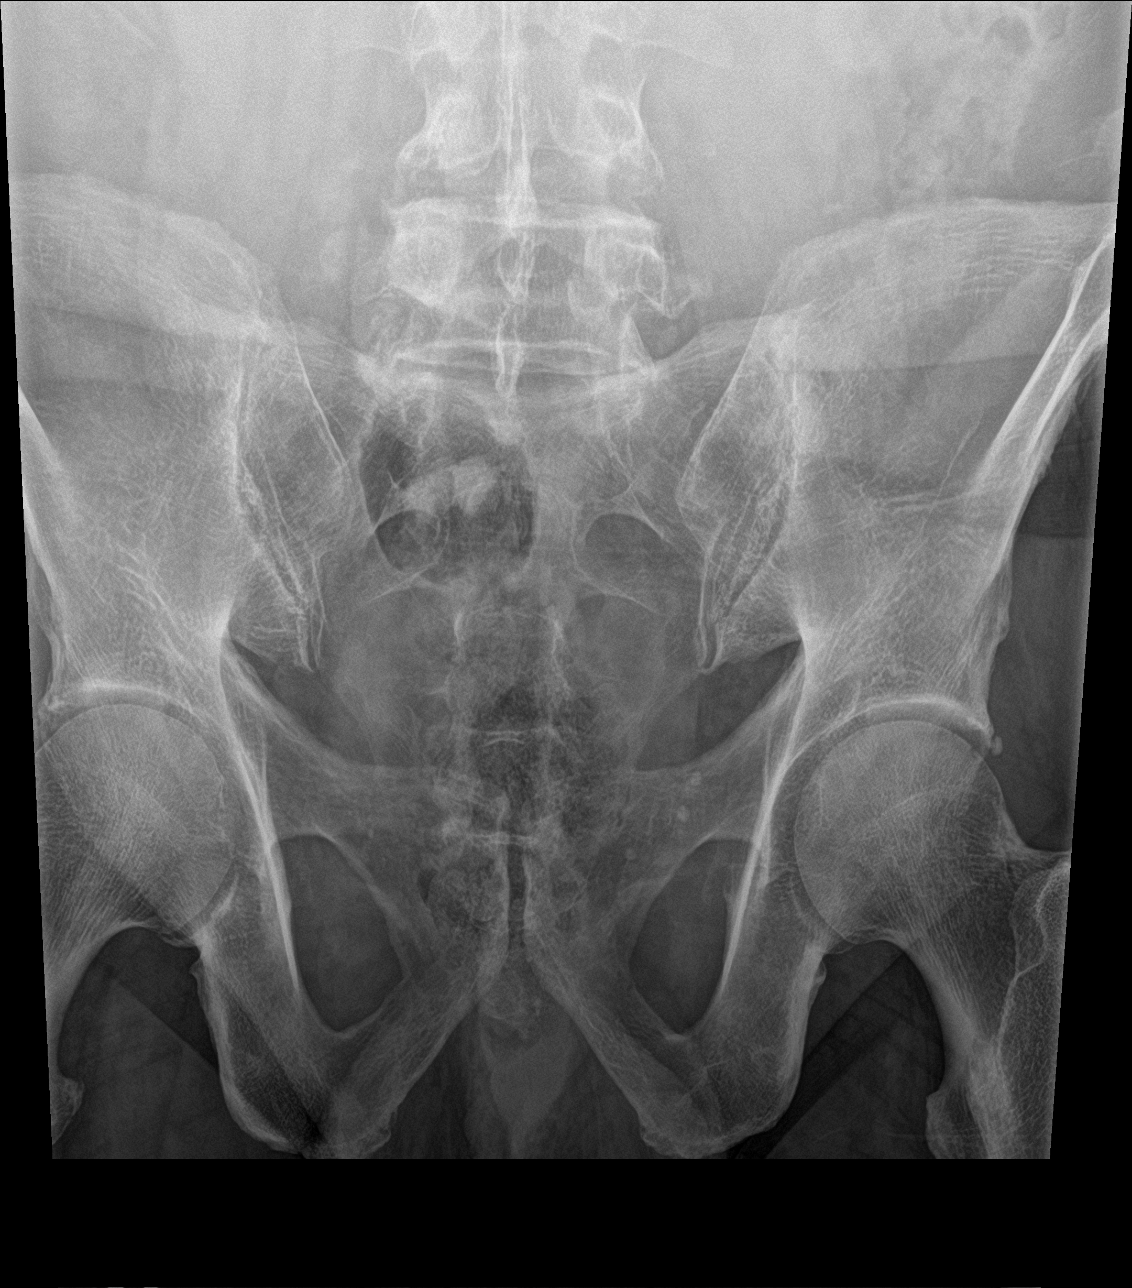

[si joints obl (1 of 2)]
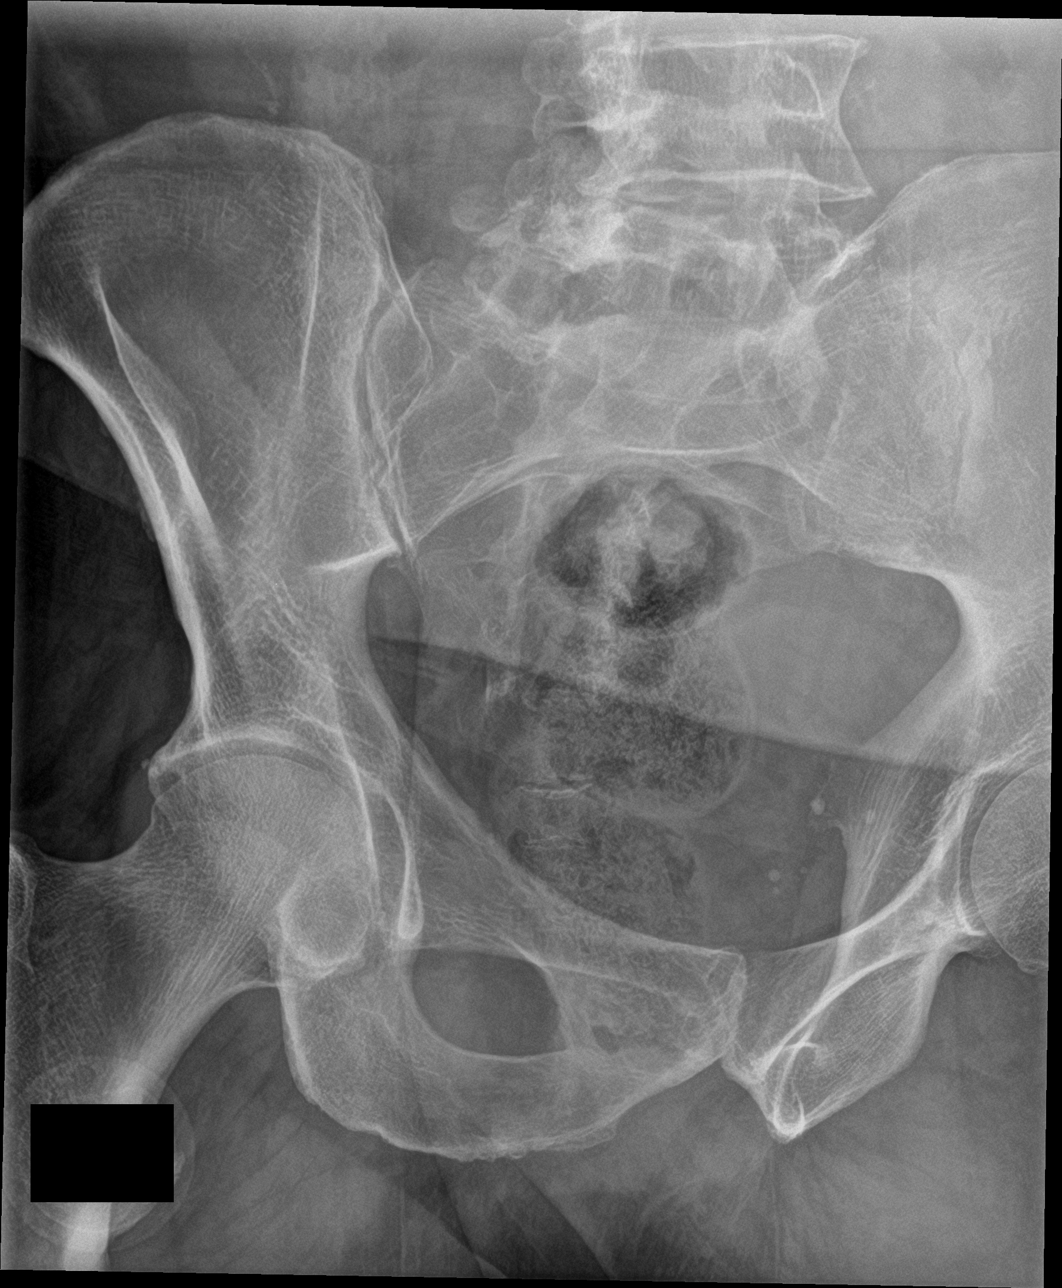

[si joints obl (2 of 2)]
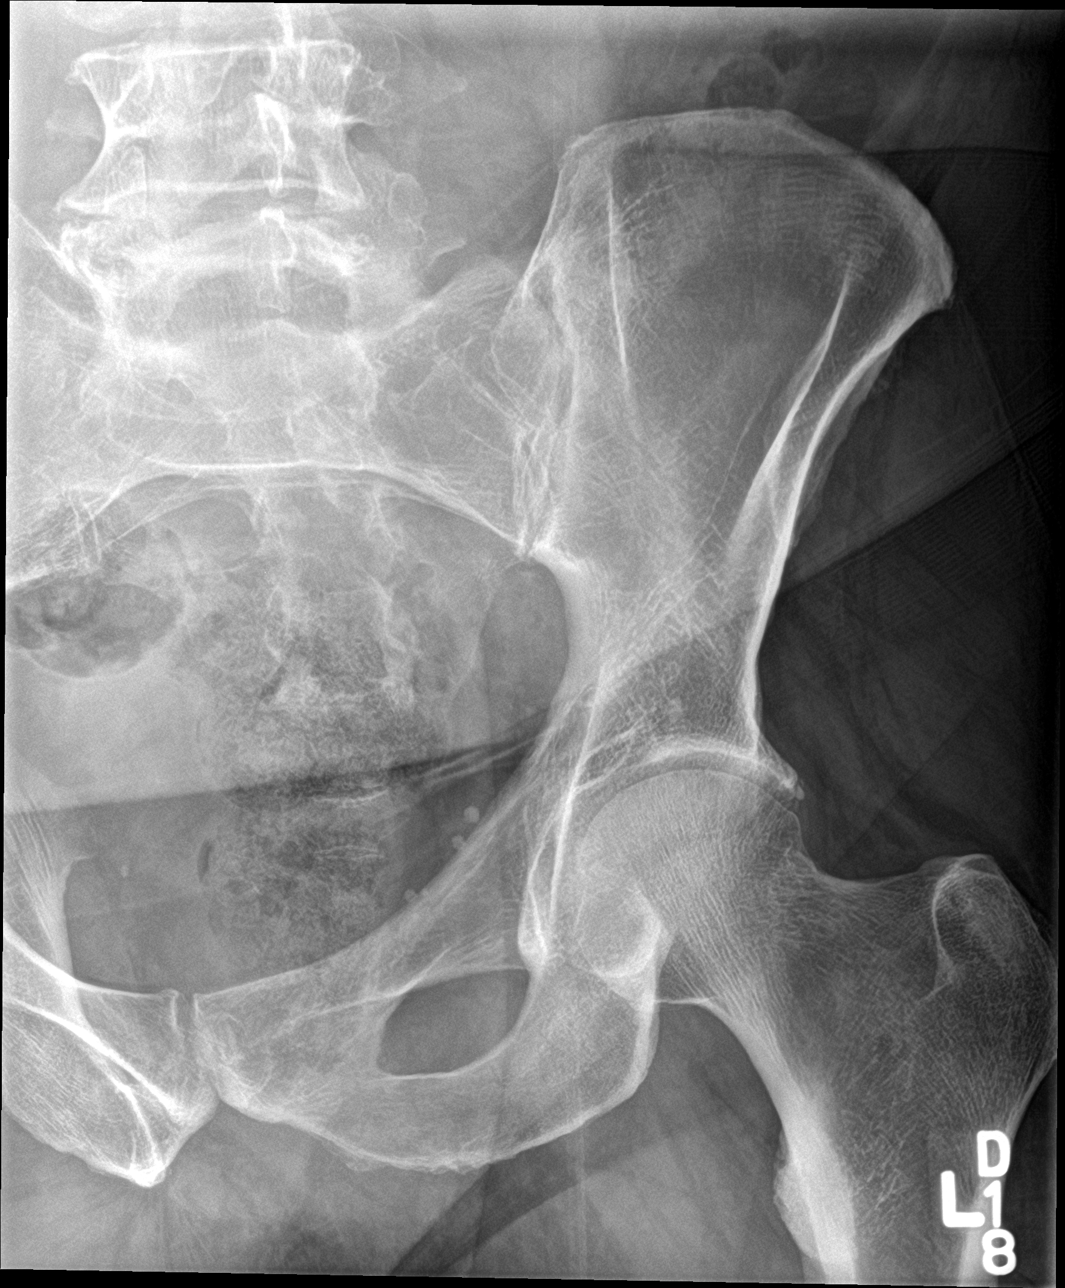

[3 of 3 positions shown; findings below may reference images not displayed]

FINDINGS: Mild sclerosis on both sides of the left sacroiliac joint and
minimal sclerosis on the iliac side of the right sacroiliac joint.
Lower lumbar spine degenerative changes.
IMPRESSION: 1. Mild left and minimal right sacroiliitis.
2. Lower lumbar spine degenerative changes.

## 2022-01-12 NOTE — Progress Notes (Signed)
Safety precautions to be maintained throughout the outpatient stay will include: orient to surroundings, keep bed in low position, maintain call bell within reach at all times, provide assistance with transfer out of bed and ambulation.  

## 2022-01-20 DIAGNOSIS — M21612 Bunion of left foot: Secondary | ICD-10-CM | POA: Diagnosis not present

## 2022-01-20 DIAGNOSIS — I739 Peripheral vascular disease, unspecified: Secondary | ICD-10-CM | POA: Diagnosis not present

## 2022-01-20 DIAGNOSIS — M542 Cervicalgia: Secondary | ICD-10-CM | POA: Diagnosis not present

## 2022-01-20 DIAGNOSIS — B351 Tinea unguium: Secondary | ICD-10-CM | POA: Diagnosis not present

## 2022-01-20 DIAGNOSIS — L600 Ingrowing nail: Secondary | ICD-10-CM | POA: Diagnosis not present

## 2022-01-20 DIAGNOSIS — R0789 Other chest pain: Secondary | ICD-10-CM | POA: Diagnosis not present

## 2022-01-20 DIAGNOSIS — E785 Hyperlipidemia, unspecified: Secondary | ICD-10-CM | POA: Diagnosis not present

## 2022-01-20 DIAGNOSIS — E119 Type 2 diabetes mellitus without complications: Secondary | ICD-10-CM | POA: Diagnosis not present

## 2022-01-20 DIAGNOSIS — I10 Essential (primary) hypertension: Secondary | ICD-10-CM | POA: Diagnosis not present

## 2022-01-20 DIAGNOSIS — M5432 Sciatica, left side: Secondary | ICD-10-CM | POA: Diagnosis not present

## 2022-01-21 DIAGNOSIS — R9431 Abnormal electrocardiogram [ECG] [EKG]: Secondary | ICD-10-CM | POA: Diagnosis not present

## 2022-01-21 DIAGNOSIS — I340 Nonrheumatic mitral (valve) insufficiency: Secondary | ICD-10-CM | POA: Diagnosis not present

## 2022-01-21 DIAGNOSIS — E782 Mixed hyperlipidemia: Secondary | ICD-10-CM | POA: Diagnosis not present

## 2022-01-21 DIAGNOSIS — I2510 Atherosclerotic heart disease of native coronary artery without angina pectoris: Secondary | ICD-10-CM | POA: Diagnosis not present

## 2022-01-21 DIAGNOSIS — R079 Chest pain, unspecified: Secondary | ICD-10-CM | POA: Diagnosis not present

## 2022-01-21 DIAGNOSIS — I10 Essential (primary) hypertension: Secondary | ICD-10-CM | POA: Diagnosis not present

## 2022-01-21 DIAGNOSIS — E119 Type 2 diabetes mellitus without complications: Secondary | ICD-10-CM | POA: Diagnosis not present

## 2022-01-21 DIAGNOSIS — K219 Gastro-esophageal reflux disease without esophagitis: Secondary | ICD-10-CM | POA: Diagnosis not present

## 2022-02-04 DIAGNOSIS — E782 Mixed hyperlipidemia: Secondary | ICD-10-CM | POA: Diagnosis not present

## 2022-02-04 DIAGNOSIS — K219 Gastro-esophageal reflux disease without esophagitis: Secondary | ICD-10-CM | POA: Diagnosis not present

## 2022-02-04 DIAGNOSIS — E119 Type 2 diabetes mellitus without complications: Secondary | ICD-10-CM | POA: Diagnosis not present

## 2022-02-04 DIAGNOSIS — I2510 Atherosclerotic heart disease of native coronary artery without angina pectoris: Secondary | ICD-10-CM | POA: Diagnosis not present

## 2022-02-04 DIAGNOSIS — I340 Nonrheumatic mitral (valve) insufficiency: Secondary | ICD-10-CM | POA: Diagnosis not present

## 2022-02-04 DIAGNOSIS — I10 Essential (primary) hypertension: Secondary | ICD-10-CM | POA: Diagnosis not present

## 2022-03-03 DIAGNOSIS — M21612 Bunion of left foot: Secondary | ICD-10-CM | POA: Diagnosis not present

## 2022-03-03 DIAGNOSIS — R0789 Other chest pain: Secondary | ICD-10-CM | POA: Diagnosis not present

## 2022-03-03 DIAGNOSIS — B351 Tinea unguium: Secondary | ICD-10-CM | POA: Diagnosis not present

## 2022-03-03 DIAGNOSIS — E785 Hyperlipidemia, unspecified: Secondary | ICD-10-CM | POA: Diagnosis not present

## 2022-03-03 DIAGNOSIS — I739 Peripheral vascular disease, unspecified: Secondary | ICD-10-CM | POA: Diagnosis not present

## 2022-03-03 DIAGNOSIS — E119 Type 2 diabetes mellitus without complications: Secondary | ICD-10-CM | POA: Diagnosis not present

## 2022-03-03 DIAGNOSIS — I10 Essential (primary) hypertension: Secondary | ICD-10-CM | POA: Diagnosis not present

## 2022-03-03 DIAGNOSIS — M5432 Sciatica, left side: Secondary | ICD-10-CM | POA: Diagnosis not present

## 2022-03-03 DIAGNOSIS — L600 Ingrowing nail: Secondary | ICD-10-CM | POA: Diagnosis not present

## 2022-03-03 DIAGNOSIS — M542 Cervicalgia: Secondary | ICD-10-CM | POA: Diagnosis not present

## 2022-04-08 DIAGNOSIS — R079 Chest pain, unspecified: Secondary | ICD-10-CM | POA: Diagnosis not present

## 2022-04-08 DIAGNOSIS — K219 Gastro-esophageal reflux disease without esophagitis: Secondary | ICD-10-CM | POA: Diagnosis not present

## 2022-04-08 DIAGNOSIS — R0602 Shortness of breath: Secondary | ICD-10-CM | POA: Diagnosis not present

## 2022-04-08 DIAGNOSIS — I10 Essential (primary) hypertension: Secondary | ICD-10-CM | POA: Diagnosis not present

## 2022-04-08 DIAGNOSIS — E782 Mixed hyperlipidemia: Secondary | ICD-10-CM | POA: Diagnosis not present

## 2022-04-08 DIAGNOSIS — I2510 Atherosclerotic heart disease of native coronary artery without angina pectoris: Secondary | ICD-10-CM | POA: Diagnosis not present

## 2022-04-08 DIAGNOSIS — I340 Nonrheumatic mitral (valve) insufficiency: Secondary | ICD-10-CM | POA: Diagnosis not present

## 2022-04-08 DIAGNOSIS — E119 Type 2 diabetes mellitus without complications: Secondary | ICD-10-CM | POA: Diagnosis not present

## 2022-04-16 DIAGNOSIS — M5412 Radiculopathy, cervical region: Secondary | ICD-10-CM | POA: Diagnosis not present

## 2022-05-04 DIAGNOSIS — M5412 Radiculopathy, cervical region: Secondary | ICD-10-CM | POA: Diagnosis not present

## 2022-05-20 DIAGNOSIS — M5412 Radiculopathy, cervical region: Secondary | ICD-10-CM | POA: Diagnosis not present

## 2022-06-01 ENCOUNTER — Other Ambulatory Visit: Payer: 59

## 2022-06-01 ENCOUNTER — Other Ambulatory Visit: Payer: Self-pay | Admitting: Internal Medicine

## 2022-06-01 DIAGNOSIS — E119 Type 2 diabetes mellitus without complications: Secondary | ICD-10-CM | POA: Diagnosis not present

## 2022-06-02 ENCOUNTER — Ambulatory Visit (INDEPENDENT_AMBULATORY_CARE_PROVIDER_SITE_OTHER): Payer: 59 | Admitting: Internal Medicine

## 2022-06-02 ENCOUNTER — Encounter: Payer: Self-pay | Admitting: Internal Medicine

## 2022-06-02 VITALS — BP 126/76 | HR 97 | Ht 68.0 in | Wt 193.4 lb

## 2022-06-02 DIAGNOSIS — E119 Type 2 diabetes mellitus without complications: Secondary | ICD-10-CM | POA: Diagnosis not present

## 2022-06-02 DIAGNOSIS — M5136 Other intervertebral disc degeneration, lumbar region: Secondary | ICD-10-CM

## 2022-06-02 DIAGNOSIS — E7800 Pure hypercholesterolemia, unspecified: Secondary | ICD-10-CM

## 2022-06-02 LAB — LIPID PANEL W/O CHOL/HDL RATIO
Cholesterol, Total: 136 mg/dL (ref 100–199)
HDL: 38 mg/dL — ABNORMAL LOW (ref >39)
LDL Chol Calc (NIH): 73 mg/dL (ref 0–99)
Triglycerides: 145 mg/dL (ref 0–149)
VLDL Cholesterol Cal: 25 mg/dL (ref 5–40)

## 2022-06-02 LAB — GLUCOSE, POCT (MANUAL RESULT ENTRY): POC Glucose: 125 mg/dl — AB (ref 70–99)

## 2022-06-02 LAB — HGB A1C W/O EAG: Hgb A1c MFr Bld: 7.3 % — ABNORMAL HIGH (ref 4.8–5.6)

## 2022-06-02 MED ORDER — GLUCOSE BLOOD VI STRP: ORAL_STRIP | 12 refills | Status: DC

## 2022-06-02 MED ORDER — SYNJARDY 5-1000 MG PO TABS: 1.0000 | ORAL_TABLET | Freq: Two times a day (BID) | ORAL | 0 refills | Status: DC

## 2022-06-02 NOTE — Addendum Note (Signed)
Addended by: Hulan Fray on: 06/02/2022 11:59 AM   Modules accepted: Orders

## 2022-06-02 NOTE — Progress Notes (Signed)
Established Patient Office Visit  Subjective:  Patient ID: Eric Guerrero, male    DOB: 10/18/1950  Age: 72 y.o. MRN: [id number]  Chief Complaint  Patient presents with   Follow-up    3 month follow up, discuss lab results.    No new complaints, here for lab review and medication refills. Labs reviewed and notable for  deterioration in A1c but LDL is at target. Admits to dietary indiscretion.     Past Medical History:  Diagnosis Date   Diabetes mellitus without complication ([place])    Hyperlipidemia    Hypertension     Past Surgical History:  Procedure Laterality Date   LEFT HEART CATH AND CORONARY ANGIOGRAPHY Left 09/11/2020   Procedure: LEFT HEART CATH AND CORONARY ANGIOGRAPHY;  Surgeon: Dionisio David, MD;  Location: [facility];  Service: Cardiovascular;  Laterality: Left;    Social History   Socioeconomic History   Marital status: Unknown    Spouse name: Not on file   Number of children: Not on file   Years of education: Not on file   Highest education level: Not on file  Occupational History   Not on file  Tobacco Use   Smoking status: Never   Smokeless tobacco: Never  Substance and Sexual Activity   Alcohol use: Never   Drug use: Never   Sexual activity: Not on file  Other Topics Concern   Not on file  Social History Narrative   Not on file   Social Determinants of Health   Financial Resource Strain: Not on file  Food Insecurity: Not on file  Transportation Needs: Not on file  Physical Activity: Not on file  Stress: Not on file  Social Connections: Not on file  Intimate Partner Violence: Not on file    No family history on file.  No Known Allergies  Review of Systems  All other systems reviewed and are negative.      Objective:   BP 126/76   Pulse 97   Ht '5\' 8"'$  (1.727 m)   Wt 193 lb 6.4 oz (87.7 kg)   SpO2 97%   BMI 29.41 kg/m   Vitals:   06/02/22 1139  BP: 126/76  Pulse: 97  Height: '5\' 8"'$  (1.727 m)  Weight: 193 lb  6.4 oz (87.7 kg)  SpO2: 97%  BMI (Calculated): 29.41    Physical Exam Vitals reviewed.  Constitutional:      Appearance: Normal appearance.  HENT:     Head: Normocephalic.     Left Ear: There is no impacted cerumen.     Nose: Nose normal.     Mouth/Throat:     Mouth: Mucous membranes are moist.     Pharynx: No posterior oropharyngeal erythema.  Eyes:     Extraocular Movements: Extraocular movements intact.     Pupils: Pupils are equal, round, and reactive to light.  Cardiovascular:     Rate and Rhythm: Regular rhythm.     Chest Wall: PMI is not displaced.     Pulses: Normal pulses.     Heart sounds: Normal heart sounds. No murmur heard. Pulmonary:     Effort: Pulmonary effort is normal.     Breath sounds: Normal air entry. No rhonchi or rales.  Abdominal:     General: Abdomen is flat. Bowel sounds are normal. There is no distension.     Palpations: Abdomen is soft. There is no hepatomegaly, splenomegaly or mass.     Tenderness: There is no abdominal tenderness.  Musculoskeletal:        General: Normal range of motion.     Cervical back: Normal range of motion and neck supple.     Right lower leg: No edema.     Left lower leg: No edema.  Skin:    General: Skin is warm and dry.  Neurological:     General: No focal deficit present.     Mental Status: He is alert and oriented to person, place, and time.     Cranial Nerves: No cranial nerve deficit.     Motor: No weakness.  Psychiatric:        Mood and Affect: Mood normal.        Behavior: Behavior normal.      No results found for any visits on 06/02/22.  Recent Results (from the past 2160 hour(Livana Yerian))  Lipid Panel w/o Chol/HDL Ratio     Status: Abnormal   Collection Time: 06/01/22 10:27 AM  Result Value Ref Range   Cholesterol, Total 136 100 - 199 mg/dL   Triglycerides 145 0 - 149 mg/dL   HDL 38 (L) >39 mg/dL   VLDL Cholesterol Cal 25 5 - 40 mg/dL   LDL Chol Calc (NIH) 73 0 - 99 mg/dL  Hgb A1c w/o eAG     Status:  Abnormal   Collection Time: 06/01/22 10:27 AM  Result Value Ref Range   Hgb A1c MFr Bld 7.3 (H) 4.8 - 5.6 %    Comment:          Prediabetes: 5.7 - 6.4          Diabetes: >6.4          Glycemic control for adults with diabetes: <7.0       Assessment & Plan:   Problem List Items Addressed This Visit   None 1. Pure hypercholesterolemia - Comprehensive metabolic panel; Future - Lipid panel; Future - CK; Future  2. Controlled type 2 diabetes mellitus without complication, without long-term current use of insulin (HCC) - Empagliflozin-metFORMIN HCl (SYNJARDY) 07-998 MG TABS; Take 1 tablet by mouth 2 (two) times daily.  Dispense: 180 tablet; Refill: 0 - glucose blood test strip; Use as instructed test bid  Dispense: 100 each; Refill: 12 - Hemoglobin A1c; Future  3. DDD (degenerative disc disease), lumbar    No follow-ups on file.   Total time spent: 20 minutes  Volanda Napoleon, MD  06/02/2022

## 2022-06-07 ENCOUNTER — Ambulatory Visit (INDEPENDENT_AMBULATORY_CARE_PROVIDER_SITE_OTHER): Payer: 59 | Admitting: Cardiovascular Disease

## 2022-06-07 ENCOUNTER — Encounter: Payer: Self-pay | Admitting: Cardiovascular Disease

## 2022-06-07 VITALS — BP 132/72 | HR 80 | Ht 68.0 in | Wt 194.8 lb

## 2022-06-07 DIAGNOSIS — R0789 Other chest pain: Secondary | ICD-10-CM

## 2022-06-07 DIAGNOSIS — E782 Mixed hyperlipidemia: Secondary | ICD-10-CM

## 2022-06-07 DIAGNOSIS — G542 Cervical root disorders, not elsewhere classified: Secondary | ICD-10-CM | POA: Diagnosis not present

## 2022-06-07 DIAGNOSIS — I70218 Atherosclerosis of native arteries of extremities with intermittent claudication, other extremity: Secondary | ICD-10-CM

## 2022-06-07 DIAGNOSIS — I739 Peripheral vascular disease, unspecified: Secondary | ICD-10-CM | POA: Diagnosis not present

## 2022-06-07 MED ORDER — PANTOPRAZOLE SODIUM 40 MG PO TBEC: 40.0000 mg | DELAYED_RELEASE_TABLET | Freq: Every day | ORAL | 1 refills | Status: DC

## 2022-06-07 NOTE — Progress Notes (Signed)
Cardiology Office Note   Date:  06/07/2022   ID:  Eric Guerrero, DOB [birth year], MRN [id number]  PCP:  Eric Marble, MD  Cardiologist:  Neoma Laming, MD      History of Present Illness: Eric Guerrero is a 72 y.o. male who presents for  Chief Complaint  Patient presents with   Follow-up    1 month follow up    Chest pain and arm pain resolved with PT/medications by Dr Sabra Heck. He took robaxin and prednisone.  Chest Pain  This is a chronic problem. The current episode started more than 1 month ago. The onset quality is gradual. The problem has been resolved.      Past Medical History:  Diagnosis Date   Diabetes mellitus without complication ([place])    Hyperlipidemia    Hypertension      Past Surgical History:  Procedure Laterality Date   LEFT HEART CATH AND CORONARY ANGIOGRAPHY Left 09/11/2020   Procedure: LEFT HEART CATH AND CORONARY ANGIOGRAPHY;  Surgeon: Dionisio David, MD;  Location: [facility];  Service: Cardiovascular;  Laterality: Left;     Current Outpatient Medications  Medication Sig Dispense Refill   Empagliflozin-metFORMIN HCl (SYNJARDY) 07-998 MG TABS Take 1 tablet by mouth 2 (two) times daily. 180 tablet 0   glucose blood test strip Use as instructed test bid 100 each 12   methocarbamol (ROBAXIN) 500 MG tablet Take 1 tablet (500 mg total) by mouth every 12 (twelve) hours as needed for muscle spasms. 60 tablet 5   rivaroxaban (XARELTO) 2.5 MG TABS tablet Take 2.5 mg by mouth 2 (two) times daily.     rosuvastatin (CRESTOR) 10 MG tablet Take 40 mg by mouth daily.     diclofenac Sodium (VOLTAREN) 1 % GEL Apply topically 4 (four) times daily. (Patient not taking: Reported on 06/02/2022)     pantoprazole (PROTONIX) 40 MG tablet Take 1 tablet (40 mg total) by mouth daily. 90 tablet 1   No current facility-administered medications for this visit.   Facility-Administered Medications Ordered in Other Visits  Medication Dose Route Frequency  Provider Last Rate Last Admin   sodium chloride flush (NS) 0.9 % injection 3 mL  3 mL Intravenous Q12H Dionisio David, MD        Allergies:   Patient has no known allergies.    Social History:   reports that he has never smoked. He has never used smokeless tobacco. He reports that he does not drink alcohol and does not use drugs.   Family History:  family history is not on file.    ROS:     Review of Systems  Constitutional: Negative.   HENT: Negative.    Eyes: Negative.   Respiratory: Negative.    Cardiovascular:  Positive for chest pain.  Gastrointestinal: Negative.   Genitourinary: Negative.   Musculoskeletal: Negative.   Skin: Negative.   Neurological: Negative.   Endo/Heme/Allergies: Negative.   Psychiatric/Behavioral: Negative.    All other systems reviewed and are negative.     All other systems are reviewed and negative.    PHYSICAL EXAM: VS:  BP 132/72   Pulse 80   Ht '5\' 8"'$  (1.727 m)   Wt 194 lb 12.8 oz (88.4 kg)   SpO2 97%   BMI 29.62 kg/m  , BMI Body mass index is 29.62 kg/m. Last weight:  Wt Readings from Last 3 Encounters:  06/07/22 194 lb 12.8 oz (88.4 kg)  06/02/22 193 lb 6.4 oz (87.7  kg)  01/12/22 195 lb (88.5 kg)     Physical Exam Vitals reviewed.  Constitutional:      Appearance: Normal appearance. He is normal weight.  HENT:     Head: Normocephalic.     Nose: Nose normal.     Mouth/Throat:     Mouth: Mucous membranes are moist.  Eyes:     Pupils: Pupils are equal, round, and reactive to light.  Cardiovascular:     Rate and Rhythm: Normal rate and regular rhythm.     Pulses: Normal pulses.     Heart sounds: Normal heart sounds.  Pulmonary:     Effort: Pulmonary effort is normal.  Abdominal:     General: Abdomen is flat. Bowel sounds are normal.  Musculoskeletal:        General: Normal range of motion.     Cervical back: Normal range of motion.  Skin:    General: Skin is warm.  Neurological:     General: No focal deficit  present.     Mental Status: He is alert.  Psychiatric:        Mood and Affect: Mood normal.       EKG:   Recent Labs: 11/04/2021: BUN 10; Creatinine, Ser 1.06; Magnesium 2.3; Potassium 4.8; Sodium 138    Lipid Panel    Component Value Date/Time   CHOL 136 06/01/2022 1027   TRIG 145 06/01/2022 1027   HDL 38 (L) 06/01/2022 1027   LDLCALC 73 06/01/2022 1027      TESTS                                                                                          [facility] [address] [place], [place] [address] [phone] STUDY:  Gated Stress / Rest Myocardial Perfusion Imaging Tomographic (SPECT) Including attenuation correction Wall Motion, Left Ventricular Ejection Fraction By Gated Technique.Treadmill Stress Test. SEX: Male  WEIGHT: 208 lbs  HEIGHT: 63 in     ARMS UP: YES/NO                                                                        REFERRING PHYSICIAN: Dr.Safal Halderman Humphrey Rolls  INDICATION FOR STUDY: SOB                                                                                                                                                                                                                     TECHNIQUE:  Approximately 20 minutes following the intravenous administration of 10.3 mCi of Tc-17mSestamibi after stress testing in a reclined supine position with arms above their head if able to do so, gated SPECT imaging of the heart was performed. After about a 2hr break, the patient was injected intravenously with 33.6 mCi of Tc-945mestamibi.  Approximately 45 minutes later in the same position as stress imaging SPECT rest imaging of the heart was performed.  STRESS BY:  ShNeoma LamingMD PROTOCOL:  BrDarnell Level                                                                                       MAX PRED HR: 183                     85%: 156               75%: 137                                                                                                                   RESTING BP: 132/80  RESTING HR: 66  PEAK BP: 130/80   PEAK HR: 122 (66%)  EXERCISE DURATION: 7:34                                             METS:  9.4    REASON FOR TEST TERMINATION: Fatigue. SOB.                                                                                                                               SYMPTOMS: Fatigue. SOB.  [place] TREADMILL SCORE: 7.5                                       RISK: Low                                                                                                                                                                                                            EKG RESULTS: NSR. 66/min. No significant ST changes at peak exercise.                                                              IMAGE QUALITY: Good  PERFUSION/WALL MOTION FINDINGS: EF = 80%. Small mild fixed apex (17) wall defect, normal wall motion.                                                                          IMPRESSION: Probable normal stress test. Above defect most likely due to breast attenuation artifact with normal LVEF.                                                                                                                                                                                                                                                                                         Neoma Laming, MD Stress Interpreting Physician / Nuclear Interpreting Physician                         Neoma Laming MD  Electronically signed by: Neoma Laming     Date: 04/15/2022 10:44 REASON FOR VISIT  Visit for: Echocardiogram/R06.02  Sex:   Male  wt= 207   lbs.  BP=146/90  Height= 62   inches.        TESTS  Imaging: Echocardiogram:  An echocardiogram in (2-d) mode was performed and in Doppler mode with color flow velocity mapping was performed. The aortic valve cusps are abnormal 2.0  cm, flow velocity 0000000  m/s, and systolic calculated mean flow gradient 4   mmHg. Mitral valve diastolic peak flow velocity E .706    m/s and E/A ratio 1.4. Aortic root diameter 3.4   cm. The LVOT internal diameter 2.5  cm and flow velocity was abnormal [id number]  m/s. LV systolic dimension [id number]   cm, diastolic [id number]  cm, posterior wall thickness 0.899   cm, fractional shortening 43.3 %, and EF 74.5 %. IVS thickness 0.663  cm. LA dimension 3.8 cm. Mitral Valve has Mild Regurgitation. Tricuspid Valve has Trace Regurgitation.  ASSESSMENT  Technically adequate study.  Normal chamber sizes.  Normal left ventricular systolic function..  Normal right ventricular systolic function.  Normal right ventricular diastolic function.  Normal left ventricular wall motion.  Normal right ventricular wall motion.  Trace tricuspid regurgitation.  Normal pulmonary artery pressure.  Mild mitral regurgitation.  No pericardial effusion.  Mild LVH.     THERAPY   Referring physician: Dionisio David  Sonographer: Neoma Laming.      Neoma Laming MD  Electronically signed by: Neoma Laming     Date: 02/02/2022 11:50 REASON FOR VISIT  Referred by Neoma Laming.        TESTS  Imaging: Computed Tomographic Angiography:  Cardiac multidetector CT was performed paying particular attention to the coronary arteries for the diagnosis of: Chest  pain. Diagnostic Drugs:  Administered iohexol (Omnipaque) through an antecubital vein and images from the examination were analyzed for the presence and extent of coronary artery disease, using 3D image processing software. 100 mL of non-ionic contrast (Omnipaque) was used.        TEST CONCLUSIONS  Quality of study is good.    1 - Calcium score is 0.  2 - Right dominant system.  3 - Normal coronaries.     Neoma Laming MD  Electronically signed by: Neoma Laming     Date: 09/18/2015 09:42 Other studies Reviewed: Additional studies/ records that were reviewed today include:  Review of the above records demonstrates:       No data to display            ASSESSMENT AND PLAN:    ICD-10-CM   1. Claudication of left upper extremity due to atherosclerosis ([place])  I70.218     2. PAD (peripheral artery disease) (HCC)  I73.9     3. Cervical nerve root impingement (C3-C7)  G54.2     4. Mixed hyperlipidemia  E78.2     5. Left-sided chest wall pain  R07.89        Problem List Items Addressed This Visit       Cardiovascular and Mediastinum   Claudication of left upper extremity due to atherosclerosis (HCC) - Primary (Chronic)   PAD (peripheral artery disease) (HCC)     Nervous and Auditory   Cervical nerve root impingement (C3-C7) (Chronic)     Other   Left-sided chest wall pain (Chronic)    resolved      Hyperlipidemia       Disposition:   Return in about 2 months (around 08/07/2022).    Total time spent: 30 minutes  Signed,  Neoma Laming, MD  06/07/2022 1:29 PM    [facility]

## 2022-06-07 NOTE — Progress Notes (Signed)
Cardiology Office Note   Date:  06/07/2022   ID:  Eric Guerrero, DOB 1950-08-08, MRN [id number]  PCP:  Jodi Marble, MD  Cardiologist:  Neoma Laming, MD      History of Present Illness: Eric Guerrero is a 72 y.o. male who presents for  Chief Complaint  Patient presents with   Follow-up    1 month follow up    HPI    Past Medical History:  Diagnosis Date   Diabetes mellitus without complication ([place])    Hyperlipidemia    Hypertension      Past Surgical History:  Procedure Laterality Date   LEFT HEART CATH AND CORONARY ANGIOGRAPHY Left 09/11/2020   Procedure: LEFT HEART CATH AND CORONARY ANGIOGRAPHY;  Surgeon: Dionisio David, MD;  Location: [facility];  Service: Cardiovascular;  Laterality: Left;     Current Outpatient Medications  Medication Sig Dispense Refill   Empagliflozin-metFORMIN HCl (SYNJARDY) 07-998 MG TABS Take 1 tablet by mouth 2 (two) times daily. 180 tablet 0   glucose blood test strip Use as instructed test bid 100 each 12   methocarbamol (ROBAXIN) 500 MG tablet Take 1 tablet (500 mg total) by mouth every 12 (twelve) hours as needed for muscle spasms. 60 tablet 5   pantoprazole (PROTONIX) 40 MG tablet Take 40 mg by mouth daily.     rivaroxaban (XARELTO) 2.5 MG TABS tablet Take 2.5 mg by mouth 2 (two) times daily.     rosuvastatin (CRESTOR) 10 MG tablet Take 40 mg by mouth daily.     diclofenac Sodium (VOLTAREN) 1 % GEL Apply topically 4 (four) times daily. (Patient not taking: Reported on 06/02/2022)     No current facility-administered medications for this visit.   Facility-Administered Medications Ordered in Other Visits  Medication Dose Route Frequency Provider Last Rate Last Admin   sodium chloride flush (NS) 0.9 % injection 3 mL  3 mL Intravenous Q12H Dionisio David, MD        Allergies:   Patient has no known allergies.    Social History:   reports that he has never smoked. He has never used smokeless tobacco. He reports  that he does not drink alcohol and does not use drugs.   Family History:  family history is not on file.    ROS:     ROS    All other systems are reviewed and negative.    PHYSICAL EXAM: VS:  BP 132/72   Pulse 80   Ht '5\' 8"'$  (1.727 m)   Wt 194 lb 12.8 oz (88.4 kg)   SpO2 97%   BMI 29.62 kg/m  , BMI Body mass index is 29.62 kg/m. Last weight:  Wt Readings from Last 3 Encounters:  06/07/22 194 lb 12.8 oz (88.4 kg)  06/02/22 193 lb 6.4 oz (87.7 kg)  01/12/22 195 lb (88.5 kg)     Physical Exam    EKG:   Recent Labs: 11/04/2021: BUN 10; Creatinine, Ser 1.06; Magnesium 2.3; Potassium 4.8; Sodium 138    Lipid Panel    Component Value Date/Time   CHOL 136 06/01/2022 1027   TRIG 145 06/01/2022 1027   HDL 38 (L) 06/01/2022 1027   LDLCALC 73 06/01/2022 1027      Other studies Reviewed: Additional studies/ records that were reviewed today include:  Review of the above records demonstrates:       No data to display            ASSESSMENT  AND PLAN:  No diagnosis found.   Problem List Items Addressed This Visit   None      Disposition:   No follow-ups on file.    Total time spent: 30 minutes  Signed,  Neoma Laming, MD  06/07/2022 1:23 PM    [place]

## 2022-06-07 NOTE — Assessment & Plan Note (Signed)
resolved 

## 2022-06-09 DIAGNOSIS — M5412 Radiculopathy, cervical region: Secondary | ICD-10-CM | POA: Diagnosis not present

## 2022-06-16 DIAGNOSIS — M5412 Radiculopathy, cervical region: Secondary | ICD-10-CM | POA: Diagnosis not present

## 2022-06-23 DIAGNOSIS — M5412 Radiculopathy, cervical region: Secondary | ICD-10-CM | POA: Diagnosis not present

## 2022-08-09 ENCOUNTER — Ambulatory Visit: Payer: 59 | Admitting: Cardiovascular Disease

## 2022-08-20 ENCOUNTER — Ambulatory Visit (INDEPENDENT_AMBULATORY_CARE_PROVIDER_SITE_OTHER): Payer: 59 | Admitting: Cardiovascular Disease

## 2022-08-20 ENCOUNTER — Encounter: Payer: Self-pay | Admitting: Cardiovascular Disease

## 2022-08-20 VITALS — BP 150/82 | HR 83 | Ht 68.0 in | Wt 193.0 lb

## 2022-08-20 DIAGNOSIS — I10 Essential (primary) hypertension: Secondary | ICD-10-CM

## 2022-08-20 DIAGNOSIS — R079 Chest pain, unspecified: Secondary | ICD-10-CM

## 2022-08-20 MED ORDER — ASPIRIN 81 MG PO TBEC: 81.0000 mg | DELAYED_RELEASE_TABLET | Freq: Every day | ORAL | 12 refills | Status: DC

## 2022-08-20 MED ORDER — LOSARTAN POTASSIUM 25 MG PO TABS: 25.0000 mg | ORAL_TABLET | Freq: Every day | ORAL | 11 refills | Status: DC

## 2022-08-20 MED ORDER — RANOLAZINE ER 500 MG PO TB12: 500.0000 mg | ORAL_TABLET | Freq: Two times a day (BID) | ORAL | 1 refills | Status: DC

## 2022-08-20 NOTE — Progress Notes (Signed)
Cardiology Office Note   Date:  08/20/2022   ID:  Eric Guerrero, DOB [birth year], MRN [id number]  PCP:  Sherron Monday, MD  Cardiologist:  Adrian Blackwater, MD      History of Present Illness: Eric Guerrero is a 72 y.o. male who presents for  Chief Complaint  Patient presents with   Acute Visit    Chest Pain    Patient in office complaining of chest pain.     Past Medical History:  Diagnosis Date   Diabetes mellitus without complication (HCC)    Hyperlipidemia    Hypertension      Past Surgical History:  Procedure Laterality Date   LEFT HEART CATH AND CORONARY ANGIOGRAPHY Left 09/11/2020   Procedure: LEFT HEART CATH AND CORONARY ANGIOGRAPHY;  Surgeon: Laurier Nancy, MD;  Location: [facility] INVASIVE CV LAB;  Service: Cardiovascular;  Laterality: Left;     Current Outpatient Medications  Medication Sig Dispense Refill   aspirin EC 81 MG tablet Take 1 tablet (81 mg total) by mouth daily. Swallow whole. 30 tablet 12   losartan (COZAAR) 25 MG tablet Take 1 tablet (25 mg total) by mouth daily. 30 tablet 11   ranolazine (RANEXA) 500 MG 12 hr tablet Take 1 tablet (500 mg total) by mouth 2 (two) times daily. 60 tablet 1   Empagliflozin-metFORMIN HCl (SYNJARDY) 07-998 MG TABS Take 1 tablet by mouth 2 (two) times daily. 180 tablet 0   glucose blood test strip Use as instructed test bid 100 each 12   methocarbamol (ROBAXIN) 500 MG tablet Take 1 tablet (500 mg total) by mouth every 12 (twelve) hours as needed for muscle spasms. 60 tablet 5   pantoprazole (PROTONIX) 40 MG tablet Take 1 tablet (40 mg total) by mouth daily. 90 tablet 1   rivaroxaban (XARELTO) 2.5 MG TABS tablet Take 2.5 mg by mouth 2 (two) times daily.     rosuvastatin (CRESTOR) 10 MG tablet Take 40 mg by mouth daily.     No current facility-administered medications for this visit.    Allergies:   Patient has no known allergies.    Social History:   reports that he has never smoked. He has never used smokeless  tobacco. He reports that he does not drink alcohol and does not use drugs.   Family History:  family history is not on file.    ROS:     Review of Systems  Constitutional: Negative.   HENT: Negative.    Eyes: Negative.   Respiratory: Negative.    Cardiovascular: Negative.   Gastrointestinal: Negative.   Genitourinary: Negative.   Musculoskeletal: Negative.   Skin: Negative.   Neurological: Negative.   Endo/Heme/Allergies: Negative.   Psychiatric/Behavioral: Negative.    All other systems reviewed and are negative.    All other systems are reviewed and negative.    PHYSICAL EXAM: VS:  BP (!) 150/82   Pulse 83   Ht 5\' 8"  (1.727 m)   Wt 193 lb (87.5 kg)   SpO2 97%   BMI 29.35 kg/m  , BMI Body mass index is 29.35 kg/m. Last weight:  Wt Readings from Last 3 Encounters:  08/20/22 193 lb (87.5 kg)  06/07/22 194 lb 12.8 oz (88.4 kg)  06/02/22 193 lb 6.4 oz (87.7 kg)    Physical Exam Vitals reviewed.  Constitutional:      Appearance: Normal appearance. He is normal weight.  HENT:     Head: Normocephalic.     Nose: Nose normal.  Mouth/Throat:     Mouth: Mucous membranes are moist.  Eyes:     Pupils: Pupils are equal, round, and reactive to light.  Cardiovascular:     Rate and Rhythm: Normal rate and regular rhythm.     Pulses: Normal pulses.     Heart sounds: Normal heart sounds.  Pulmonary:     Effort: Pulmonary effort is normal.  Abdominal:     General: Abdomen is flat. Bowel sounds are normal.  Musculoskeletal:        General: Normal range of motion.     Cervical back: Normal range of motion.  Skin:    General: Skin is warm.  Neurological:     General: No focal deficit present.     Mental Status: He is alert.  Psychiatric:        Mood and Affect: Mood normal.     EKG: normal sinus rhythm, HR 80 bpm, T-wave abnormality unchanged from previous EKG  Recent Labs: 11/04/2021: BUN 10; Creatinine, Ser 1.06; Magnesium 2.3; Potassium 4.8; Sodium 138     Lipid Panel    Component Value Date/Time   CHOL 136 06/01/2022 1027   TRIG 145 06/01/2022 1027   HDL 38 (L) 06/01/2022 1027   LDLCALC 73 06/01/2022 1027      Other studies Reviewed: Patient: [id number] - Eric Guerrero DOB:  08/29/1950    Date:  07/13/2019 10:30 Provider: Adrian Blackwater MD Encounter: ALL ANGIOGRAMS (CTA BRAIN, CAROTIDS, RENAL ARTERIES, PE)    TESTS  Imaging: Computed Tomographic Angiography:  Cardiac multidetector CT was performed paying particular attention to the coronary arteries for the diagnosis of: Chest Pain. Diagnostic Drugs:  Administered iohexol (Omnipaque) through an antecubital vein and images from the examination were analyzed for the presence and extent of coronary artery disease, using 3D image processing software. 100 mL of non-ionic contrast (Omnipaque) was used.   ASSESSMENT   The proximal LAD artery are abnormal:2  The mid-LAD artery was abnormal:1  The distal LAD artery was abnormal:1  The proximal circumflex artery was abnormal:3  The distal circumflex artery was abnormal:1  The first obtuse marginal branch artery (OM-1) was abnormal:1     TEST CONCLUSIONS  Quality of study: Good.  1-Calcium score: 148.8  2-Right dominant system.  3-Proximal LAD and LCX have mild disease. Treat aggressively with medication.   Adrian Blackwater MD  Electronically signed by: Adrian Blackwater     Date: 07/17/2019 12:27  Patient: [id number] - Eric Guerrero DOB:  1950-12-13   Date:  06/20/2019 11:30 Provider: Adrian Blackwater MD Encounter: ECHO   Page 2 REASON FOR VISIT  Visit for: Echocardiogram - Chest pain  Sex: Male   wt= 210 lbs.  BP= 170/90  Height= 68 inches.    TESTS  Imaging: Echocardiogram:  An echocardiogram in (2-d) mode was performed and in Doppler mode with color flow velocity mapping was performed. The aortic valve cusps are normal 2.0 cm, flow velocity was normal 1.2 m/s, and normal calculated aortic valve systolic mean flow gradient 4.0  mmHg. Mitral valve diastolic peak flow velocity E .53 m/s and E/A ratio 0.6. Aortic root diameter 3.3 cm. The LVOT internal diameter was normal 1.8 cm and flow velocity was normal .89 m/s. LV systolic dimension 3.0 cm, diastolic 4.3 cm, posterior wall thickness 1.2 cm, fractional shortening 31 %, and EF 55-60 %. IVS thickness 1.6 cm. LA dimension 4.7 cm. Mitral Valve =  Ea= 6.0  DT= 148 msec. Tricuspid Valve =  TR jet V= 2.1  RAP= 5  RVSP= 23.0 mmHg. Aortic Valve is Normal. Pulmonic Valve= PIEDV= .82 m/s. Mitral Valve has Mild Regurgitation. Pulmonic Valve has Trace Regurgitation. Tricuspid Valve has Mild Regurgitation.     ASSESSMENT  Technically difficult study due to body habitus.  Unable to visualize and evaluate right side of heart.   Moderately dilated left atrium with left ventricle, right atrium and ventricle, and aorta appearing normal in size.  Normal left ventricular systolic function.  Normal left ventricular wall motion.  Mild to moderate left ventricular hypertrophy with GRADE 1 (relaxation abnormality) diastolic dysfunction.   Trace pulmonary regurgitation.  Mild tricuspid regurgitation.   Normal pulmonary artery pressure.   Mild mitral regurgitation.  No pericardial effusion.    THERAPY   Referring physician: Laurier Nancy  Sonographer: Richardson Landry, RCS.   Adrian Blackwater MD  Electronically signed by: Adrian Blackwater     Date: 06/21/2019 14:05  Patient: [id number] - Eric Guerrero DOB:  06/06/1950    Date:  06/20/2019 07:45 Provider: Adrian Blackwater MD Encounter: [facility]                                                                                        [facility] [address] [place], [place] [address] [phone] STUDY:  Gated Stress / Rest Myocardial Perfusion Imaging Tomographic (SPECT) Including attenuation correction Wall Motion, Left Ventricular Ejection Fraction By Gated Technique.Treadmill Stress  Test. SEX: Male      WEIGHT: 200 lbs      HEIGHT: 68 in                ARMS UP: YES/NO                                                                        REFERRING PHYSICIAN: Dr.Mazie Fencl Welton Flakes  INDICATION FOR STUDY:  CP                                                                                                                                                                                                                   TECHNIQUE:  Approximately 20 minutes following the intravenous administration of 12.0 mCi of Tc-68m Sestamibi after stress testing in a reclined supine position with arms above their head if able to do so, gated SPECT imaging of the heart was performed. After about a 2hr break, the patient was injected intravenously with 34.0 mCi of Tc-44m Sestamibi.  Approximately 45 minutes later in the same position as stress imaging SPECT rest imaging of the heart was performed.  STRESS BY:  Adrian Blackwater, MD PROTOCOL:  Smitty Cords                                                                                        MAX PRED HR: 152                     85%: 129               75%: 114                                                                                                                   RESTING BP: 142/88  RESTING HR: 98  PEAK BP: 154/90   PEAK HR: 117 (76%)  EXERCISE DURATION:  4:00                                           METS: 5.2     REASON FOR TEST TERMINATION:  Leg pain. CP. SOB.                                                                                                                                 SYMPTOMS: Leg Pain. CP. SOB.  [place] TREADMILL SCORE: -1                                       RISK:   Moderate                                                                                                                                                                                                           EKG RESULTS: NSR. 95/min. 1mm downsloping ST depression that continues into recovery                                                              IMAGE QUALITY: Good  PERFUSION/WALL MOTION FINDINGS: EF = 65%. Small mild reversible mid inferolateral and mid anterolateral wall defects, normal wall motion.                                                                           IMPRESSION: Ischemia in the LCX territory with normal LVEF.                                                                                                                                                                                                                                                                                         Adrian Blackwater, MD Stress Interpreting Physician / Nuclear Interpreting Physician   Adrian Blackwater MD  Electronically signed by: Adrian Blackwater     Date: 06/22/2019 14:15   ASSESSMENT AND PLAN:    ICD-10-CM   1. Chest pain, unspecified type  R07.9 ranolazine (RANEXA) 500 MG 12 hr tablet    aspirin EC 81 MG tablet    2. Primary hypertension  I10 losartan (COZAAR) 25 MG tablet       Problem List Items Addressed This Visit       Cardiovascular and Mediastinum   Primary hypertension    B/p elevated. Add losartan 25 mg daily.      Relevant Medications   ranolazine (RANEXA) 500 MG 12 hr tablet   aspirin EC 81 MG tablet   losartan (COZAAR) 25 MG tablet     Other    Chest pain - Primary    EKG unchanged from previous EKG. LHC 2022 1st Diag lesion is 40% stenosed. No significant CAD. Add Ranolazine, aspirin. Patient did not tolerate isosorbide in the past.       Relevant Medications   ranolazine (RANEXA) 500 MG 12 hr tablet   aspirin EC 81 MG tablet     Disposition:   Return in about 10 days (around  08/30/2022).    Total time spent: 30 minutes  Signed,  Adrian Blackwater, MD  08/20/2022 11:56 AM    [facility]

## 2022-08-20 NOTE — Assessment & Plan Note (Signed)
EKG unchanged from previous EKG. LHC 2022 1st Diag lesion is 40% stenosed. No significant CAD. Add Ranolazine, aspirin. Patient did not tolerate isosorbide in the past.

## 2022-08-20 NOTE — Assessment & Plan Note (Signed)
B/p elevated. Add losartan 25 mg daily.

## 2022-08-20 NOTE — Patient Instructions (Signed)
Start aspirin 81 mg daily Start Ranexa twice daily Start losartan for elevated blood pressure

## 2022-08-25 ENCOUNTER — Encounter: Payer: Self-pay | Admitting: Cardiovascular Disease

## 2022-08-30 ENCOUNTER — Encounter: Payer: Self-pay | Admitting: Cardiovascular Disease

## 2022-08-30 ENCOUNTER — Ambulatory Visit (INDEPENDENT_AMBULATORY_CARE_PROVIDER_SITE_OTHER): Payer: 59 | Admitting: Cardiovascular Disease

## 2022-08-30 VITALS — BP 146/86 | HR 91 | Ht 68.0 in | Wt 191.0 lb

## 2022-08-30 DIAGNOSIS — I2089 Other forms of angina pectoris: Secondary | ICD-10-CM

## 2022-08-30 DIAGNOSIS — I70218 Atherosclerosis of native arteries of extremities with intermittent claudication, other extremity: Secondary | ICD-10-CM

## 2022-08-30 DIAGNOSIS — I10 Essential (primary) hypertension: Secondary | ICD-10-CM

## 2022-08-30 DIAGNOSIS — I739 Peripheral vascular disease, unspecified: Secondary | ICD-10-CM

## 2022-08-30 MED ORDER — LOSARTAN POTASSIUM 50 MG PO TABS
50.0000 mg | ORAL_TABLET | Freq: Every day | ORAL | 1 refills | Status: DC
Start: 2022-08-30 — End: 2022-10-27

## 2022-08-30 NOTE — Assessment & Plan Note (Signed)
Increase losartan to 50 daily but repeat BP 130/80

## 2022-08-30 NOTE — Progress Notes (Signed)
Cardiology Office Note   Date:  08/30/2022   ID:  Eric Guerrero, DOB Aug 05, 1950, MRN [id number]  PCP:  Sherron Monday, MD  Cardiologist:  Adrian Blackwater, MD      History of Present Illness: Eric Guerrero is a 72 y.o. male who presents for  Chief Complaint  Patient presents with   Follow-up    10 days follow up    Feeling better      Past Medical History:  Diagnosis Date   Diabetes mellitus without complication (HCC)    Hyperlipidemia    Hypertension      Past Surgical History:  Procedure Laterality Date   LEFT HEART CATH AND CORONARY ANGIOGRAPHY Left 09/11/2020   Procedure: LEFT HEART CATH AND CORONARY ANGIOGRAPHY;  Surgeon: Laurier Nancy, MD;  Location: [facility] INVASIVE CV LAB;  Service: Cardiovascular;  Laterality: Left;     Current Outpatient Medications  Medication Sig Dispense Refill   losartan (COZAAR) 50 MG tablet Take 1 tablet (50 mg total) by mouth daily. 30 tablet 1   aspirin EC 81 MG tablet Take 1 tablet (81 mg total) by mouth daily. Swallow whole. 30 tablet 12   Empagliflozin-metFORMIN HCl (SYNJARDY) 07-998 MG TABS Take 1 tablet by mouth 2 (two) times daily. 180 tablet 0   glucose blood test strip Use as instructed test bid 100 each 12   methocarbamol (ROBAXIN) 500 MG tablet Take 1 tablet (500 mg total) by mouth every 12 (twelve) hours as needed for muscle spasms. 60 tablet 5   pantoprazole (PROTONIX) 40 MG tablet Take 1 tablet (40 mg total) by mouth daily. 90 tablet 1   ranolazine (RANEXA) 500 MG 12 hr tablet Take 1 tablet (500 mg total) by mouth 2 (two) times daily. 60 tablet 1   rivaroxaban (XARELTO) 2.5 MG TABS tablet Take 2.5 mg by mouth 2 (two) times daily.     rosuvastatin (CRESTOR) 10 MG tablet Take 40 mg by mouth daily.     No current facility-administered medications for this visit.    Allergies:   Patient has no known allergies.    Social History:   reports that he has never smoked. He has never used smokeless tobacco. He reports that  he does not drink alcohol and does not use drugs.   Family History:  family history is not on file.    ROS:     Review of Systems  Constitutional: Negative.   HENT: Negative.    Eyes: Negative.   Respiratory: Negative.    Gastrointestinal: Negative.   Genitourinary: Negative.   Musculoskeletal: Negative.   Skin: Negative.   Neurological: Negative.   Endo/Heme/Allergies: Negative.   Psychiatric/Behavioral: Negative.    All other systems reviewed and are negative.     All other systems are reviewed and negative.    PHYSICAL EXAM: VS:  BP (!) 146/86   Pulse 91   Ht 5\' 8"  (1.727 m)   Wt 191 lb (86.6 kg)   SpO2 96%   BMI 29.04 kg/m  , BMI Body mass index is 29.04 kg/m. Last weight:  Wt Readings from Last 3 Encounters:  08/30/22 191 lb (86.6 kg)  08/20/22 193 lb (87.5 kg)  06/07/22 194 lb 12.8 oz (88.4 kg)     Physical Exam Vitals reviewed.  Constitutional:      Appearance: Normal appearance. He is normal weight.  HENT:     Head: Normocephalic.     Nose: Nose normal.     Mouth/Throat:  Mouth: Mucous membranes are moist.  Eyes:     Pupils: Pupils are equal, round, and reactive to light.  Cardiovascular:     Rate and Rhythm: Normal rate and regular rhythm.     Pulses: Normal pulses.     Heart sounds: Normal heart sounds.  Pulmonary:     Effort: Pulmonary effort is normal.  Abdominal:     General: Abdomen is flat. Bowel sounds are normal.  Musculoskeletal:        General: Normal range of motion.     Cervical back: Normal range of motion.  Skin:    General: Skin is warm.  Neurological:     General: No focal deficit present.     Mental Status: He is alert.  Psychiatric:        Mood and Affect: Mood normal.       EKG:   Recent Labs: 11/04/2021: BUN 10; Creatinine, Ser 1.06; Magnesium 2.3; Potassium 4.8; Sodium 138    Lipid Panel    Component Value Date/Time   CHOL 136 06/01/2022 1027   TRIG 145 06/01/2022 1027   HDL 38 (L) 06/01/2022 1027    LDLCALC 73 06/01/2022 1027      Other studies Reviewed: Additional studies/ records that were reviewed today include:  Review of the above records demonstrates:       No data to display            ASSESSMENT AND PLAN:    ICD-10-CM   1. Primary hypertension  I10     2. Claudication of left upper extremity due to atherosclerosis (HCC)  I70.218     3. PAD (peripheral artery disease) (HCC)  I73.9     4. Other forms of angina pectoris  I20.89        Problem List Items Addressed This Visit       Cardiovascular and Mediastinum   Claudication of left upper extremity due to atherosclerosis (HCC) (Chronic)   Relevant Medications   losartan (COZAAR) 50 MG tablet   PAD (peripheral artery disease) (HCC)   Relevant Medications   losartan (COZAAR) 50 MG tablet   Primary hypertension - Primary    Increase losartan to 50 daily but repeat BP 130/80      Relevant Medications   losartan (COZAAR) 50 MG tablet     Other   Chest pain       Disposition:   Return in about 1 month (around 09/29/2022).    Total time spent: 30 minutes  Signed,  Adrian Blackwater, MD  08/30/2022 11:07 AM    [facility]

## 2022-08-31 ENCOUNTER — Other Ambulatory Visit: Payer: Self-pay | Admitting: Internal Medicine

## 2022-08-31 DIAGNOSIS — E119 Type 2 diabetes mellitus without complications: Secondary | ICD-10-CM

## 2022-09-03 ENCOUNTER — Ambulatory Visit: Payer: 59 | Admitting: Internal Medicine

## 2022-09-10 ENCOUNTER — Ambulatory Visit: Payer: 59 | Admitting: Internal Medicine

## 2022-09-17 ENCOUNTER — Ambulatory Visit: Payer: 59 | Admitting: Internal Medicine

## 2022-09-27 ENCOUNTER — Other Ambulatory Visit: Payer: Self-pay | Admitting: Internal Medicine

## 2022-09-27 ENCOUNTER — Other Ambulatory Visit: Payer: 59

## 2022-09-27 DIAGNOSIS — Z91148 Patient's other noncompliance with medication regimen for other reason: Secondary | ICD-10-CM

## 2022-09-27 DIAGNOSIS — E785 Hyperlipidemia, unspecified: Secondary | ICD-10-CM | POA: Diagnosis not present

## 2022-09-27 DIAGNOSIS — E119 Type 2 diabetes mellitus without complications: Secondary | ICD-10-CM | POA: Diagnosis not present

## 2022-09-27 NOTE — Progress Notes (Signed)
[facility]) [facility] Quality Pharmacy Team Statin Quality Measure Assessment  09/27/2022  Eric Guerrero [birth year] [id number]  Per review of chart and payor information, patient has a diagnosis of diabetes and cardiovascular disease but is not currently filling a statin prescription.  This places patient into the Statin Use In Patients with Diabetes (SUPD) and Statin Use in Patients with Cardiovascular Disease (SPC) measures for CMS.    According to pharmacy claims from the patient's payor, rosuvastatin 40mg  was last filled on 01/20/22 for a 90 day supply.  I made multiple attempts to call the patient to address the nonadherence, but was unable to reach him.      Component Value Date/Time   CHOL 136 06/01/2022 1027   TRIG 145 06/01/2022 1027   HDL 38 (L) 06/01/2022 1027   LDLCALC 73 06/01/2022 1027    Please consider ONE of the following recommendations:  Renew/Initiate high intensity statin Atorvastatin 40 mg once daily, #90, 3 refills   Rosuvastatin 40 mg once daily, #90, 3 refills    Initiate moderate intensity  statin with reduced frequency if prior  statin intolerance 1x weekly, #13, 3 refills   2x weekly, #26, 3 refills   3x weekly, #39, 3 refills    Code for past statin intolerance  (required annually)  Provider Requirements: Must associate code during an office visit or telehealth encounter   Drug Induced Myopathy G72.0   Myositis, unspecified M60.9   Myopathy, unspecified G72.9   Rhabdomyolysis  M62.82   Myalgia (SPC ONLY) M79.1   Alcoholic cirrhosis of liver without ascites K70.30   Alcoholic cirrhosis of liver with ascites K70.31   Unspecified cirrhosis of liver K74.60   Toxic liver disease with fibrosis and cirrhosis of liver K71.7   Thank you for allowing [facility] pharmacy to be a part of this patient's care.   Harlon Flor, PharmD Clinical Pharmacist  [facility] [phone]

## 2022-09-28 ENCOUNTER — Ambulatory Visit (INDEPENDENT_AMBULATORY_CARE_PROVIDER_SITE_OTHER): Payer: 59 | Admitting: Internal Medicine

## 2022-09-28 ENCOUNTER — Other Ambulatory Visit: Payer: Self-pay

## 2022-09-28 ENCOUNTER — Encounter: Payer: Self-pay | Admitting: Internal Medicine

## 2022-09-28 VITALS — BP 100/64 | HR 84 | Ht 68.0 in | Wt 197.0 lb

## 2022-09-28 DIAGNOSIS — I10 Essential (primary) hypertension: Secondary | ICD-10-CM

## 2022-09-28 DIAGNOSIS — E119 Type 2 diabetes mellitus without complications: Secondary | ICD-10-CM | POA: Diagnosis not present

## 2022-09-28 DIAGNOSIS — E782 Mixed hyperlipidemia: Secondary | ICD-10-CM | POA: Diagnosis not present

## 2022-09-28 LAB — COMPREHENSIVE METABOLIC PANEL
ALT: 23 IU/L (ref 0–44)
AST: 17 IU/L (ref 0–40)
Albumin: 4 g/dL (ref 3.8–4.8)
Alkaline Phosphatase: 102 IU/L (ref 44–121)
BUN/Creatinine Ratio: 15 (ref 10–24)
BUN: 19 mg/dL (ref 8–27)
Bilirubin Total: 0.4 mg/dL (ref 0.0–1.2)
CO2: 24 mmol/L (ref 20–29)
Calcium: 9.8 mg/dL (ref 8.6–10.2)
Chloride: 102 mmol/L (ref 96–106)
Creatinine, Ser: 1.3 mg/dL — ABNORMAL HIGH (ref 0.76–1.27)
Globulin, Total: 3 g/dL (ref 1.5–4.5)
Glucose: 122 mg/dL — ABNORMAL HIGH (ref 70–99)
Potassium: 5.1 mmol/L (ref 3.5–5.2)
Sodium: 138 mmol/L (ref 134–144)
Total Protein: 7 g/dL (ref 6.0–8.5)
eGFR: 58 mL/min/{1.73_m2} — ABNORMAL LOW (ref >59)

## 2022-09-28 LAB — LIPID PANEL W/O CHOL/HDL RATIO
Cholesterol, Total: 247 mg/dL — ABNORMAL HIGH (ref 100–199)
HDL: 38 mg/dL — ABNORMAL LOW (ref >39)
LDL Chol Calc (NIH): 161 mg/dL — ABNORMAL HIGH (ref 0–99)
Triglycerides: 256 mg/dL — ABNORMAL HIGH (ref 0–149)
VLDL Cholesterol Cal: 48 mg/dL — ABNORMAL HIGH (ref 5–40)

## 2022-09-28 LAB — POCT CBG (FASTING - GLUCOSE)-MANUAL ENTRY: Glucose Fasting, POC: 100 mg/dL — AB (ref 70–99)

## 2022-09-28 LAB — HGB A1C W/O EAG: Hgb A1c MFr Bld: 6.8 % — ABNORMAL HIGH (ref 4.8–5.6)

## 2022-09-28 MED ORDER — ROSUVASTATIN CALCIUM 10 MG PO TABS
10.0000 mg | ORAL_TABLET | Freq: Every day | ORAL | 0 refills | Status: DC
Start: 2022-09-28 — End: 2022-09-28

## 2022-09-28 MED ORDER — ROSUVASTATIN CALCIUM 10 MG PO TABS: 40.0000 mg | ORAL_TABLET | Freq: Every day | ORAL | 0 refills | Status: DC

## 2022-09-28 MED ORDER — ROSUVASTATIN CALCIUM 40 MG PO TABS
40.0000 mg | ORAL_TABLET | Freq: Every day | ORAL | 1 refills | Status: DC
Start: 2022-09-28 — End: 2022-10-29

## 2022-09-28 NOTE — Progress Notes (Signed)
Established Patient Office Visit  Subjective:  Patient ID: Eric Guerrero, male    DOB: 1951-03-10  Age: 72 y.o. MRN: [id number]  Chief Complaint  Patient presents with   Follow-up    3 MO f/U    No new complaints, here for lab review and medication refills. Labs reviewed and notable for well controlled diabetes, A1c at target but lipids very high to which he admits difficulty inf filling his statin Rx. Cmp notable for gfr of 59. Denies any hypoglycemic episodes and home bg readings have been at target. Admits to postural dizzines.    No other concerns at this time.   Past Medical History:  Diagnosis Date   Diabetes mellitus without complication (HCC)    Hyperlipidemia    Hypertension     Past Surgical History:  Procedure Laterality Date   LEFT HEART CATH AND CORONARY ANGIOGRAPHY Left 09/11/2020   Procedure: LEFT HEART CATH AND CORONARY ANGIOGRAPHY;  Surgeon: Laurier Nancy, MD;  Location: [facility] IN[facility];  Service: Cardiovascular;  Laterality: Left;    Social History   Socioeconomic History   Marital status: Unknown    Spouse name: Not on file   Number of children: Not on file   Years of education: Not on file   Highest education level: Not on file  Occupational History   Not on file  Tobacco Use   Smoking status: Never   Smokeless tobacco: Never  Substance and Sexual Activity   Alcohol use: Never   Drug use: Never   Sexual activity: Not on file  Other Topics Concern   Not on file  Social History Narrative   Not on file   Social Determinants of Health   Financial Resource Strain: Not on file  Food Insecurity: Not on file  Transportation Needs: Not on file  Physical Activity: Not on file  Stress: Not on file  Social Connections: Not on file  Intimate Partner Violence: Not on file    No family history on file.  No Known Allergies  Review of Systems  Constitutional: Negative.   HENT: Negative.    Eyes: Negative.   Respiratory: Negative.     Cardiovascular: Negative.   Gastrointestinal: Negative.   Genitourinary: Negative.  Negative for frequency and urgency.  Skin: Negative.   Neurological: Negative.   Endo/Heme/Allergies: Negative.        Objective:   BP 100/64   Pulse 84   Ht 5\' 8"  (1.727 m)   Wt 197 lb (89.4 kg)   BMI 29.95 kg/m   Vitals:   09/28/22 1335 09/28/22 1404  BP: (!) 140/90 100/64  Pulse: 84   Height: 5\' 8"  (1.727 m)   Weight: 197 lb (89.4 kg)   BMI (Calculated): 29.96     Physical Exam Vitals reviewed.  Constitutional:      Appearance: Normal appearance.  HENT:     Head: Normocephalic.     Left Ear: There is no impacted cerumen.     Nose: Nose normal.     Mouth/Throat:     Mouth: Mucous membranes are moist.     Pharynx: No posterior oropharyngeal erythema.  Eyes:     Extraocular Movements: Extraocular movements intact.     Pupils: Pupils are equal, round, and reactive to light.  Cardiovascular:     Rate and Rhythm: Regular rhythm.     Chest Wall: PMI is not displaced.     Pulses: Normal pulses.     Heart sounds: Normal heart sounds. No  murmur heard. Pulmonary:     Effort: Pulmonary effort is normal.     Breath sounds: Normal air entry. No rhonchi or rales.  Abdominal:     General: Abdomen is flat. Bowel sounds are normal. There is no distension.     Palpations: Abdomen is soft. There is no hepatomegaly, splenomegaly or mass.     Tenderness: There is no abdominal tenderness.  Musculoskeletal:        General: Normal range of motion.     Cervical back: Normal range of motion and neck supple.     Right lower leg: No edema.     Left lower leg: No edema.  Skin:    General: Skin is warm and dry.  Neurological:     General: No focal deficit present.     Mental Status: He is alert and oriented to person, place, and time.     Cranial Nerves: No cranial nerve deficit.     Motor: No weakness.  Psychiatric:        Mood and Affect: Mood normal.        Behavior: Behavior normal.       Results for orders placed or performed in visit on 09/28/22  POCT CBG (Fasting - Glucose)  Result Value Ref Range   Glucose Fasting, POC 100 (A) 70 - 99 mg/dL    Recent Results (from the past 2160 hour([place]))  Comprehensive metabolic panel     Status: Abnormal   Collection Time: 09/27/22  9:35 AM  Result Value Ref Range   Glucose 122 (H) 70 - 99 mg/dL   BUN 19 8 - 27 mg/dL   Creatinine, Ser 1.61 (H) 0.76 - 1.27 mg/dL   eGFR 58 (L) >09 UE/AVW/0.98   BUN/Creatinine Ratio 15 10 - 24   Sodium 138 134 - 144 mmol/L   Potassium 5.1 3.5 - 5.2 mmol/L   Chloride 102 96 - 106 mmol/L   CO2 24 20 - 29 mmol/L   Calcium 9.8 8.6 - 10.2 mg/dL   Total Protein 7.0 6.0 - 8.5 g/dL   Albumin 4.0 3.8 - 4.8 g/dL   Globulin, Total 3.0 1.5 - 4.5 g/dL   Bilirubin Total 0.4 0.0 - 1.2 mg/dL   Alkaline Phosphatase 102 44 - 121 IU/L   AST 17 0 - 40 IU/L   ALT 23 0 - 44 IU/L  Lipid Panel w/o Chol/HDL Ratio     Status: Abnormal   Collection Time: 09/27/22  9:35 AM  Result Value Ref Range   Cholesterol, Total 247 (H) 100 - 199 mg/dL   Triglycerides 119 (H) 0 - 149 mg/dL   HDL 38 (L) >14 mg/dL   VLDL Cholesterol Cal 48 (H) 5 - 40 mg/dL   LDL Chol Calc (NIH) 782 (H) 0 - 99 mg/dL  Hgb N5A w/o eAG     Status: Abnormal   Collection Time: 09/27/22  9:35 AM  Result Value Ref Range   Hgb A1c MFr Bld 6.8 (H) 4.8 - 5.6 %    Comment:          Prediabetes: 5.7 - 6.4          Diabetes: >6.4          Glycemic control for adults with diabetes: <7.0   POCT CBG (Fasting - Glucose)     Status: Abnormal   Collection Time: 09/28/22  1:36 PM  Result Value Ref Range   Glucose Fasting, POC 100 (A) 70 - 99 mg/dL  Assessment & Plan:  As per problem list. Increase fluid intake.   Problem List Items Addressed This Visit       Cardiovascular and Mediastinum   Primary hypertension   Relevant Medications   rosuvastatin (CRESTOR) 10 MG tablet   Other Relevant Orders   Comprehensive metabolic panel      Other   Hyperlipidemia   Relevant Medications   rosuvastatin (CRESTOR) 10 MG tablet   Other Relevant Orders   Comprehensive metabolic panel   Lipid panel   CK   Other Visit Diagnoses     Controlled type 2 diabetes mellitus without complication, without long-term current use of insulin (HCC)    -  Primary   Relevant Medications   rosuvastatin (CRESTOR) 10 MG tablet   Other Relevant Orders   POCT CBG (Fasting - Glucose) (Completed)   Hemoglobin A1c       Return in about 4 months (around 01/29/2023) for awv with labs prior.   Total time spent: 20 minutes  Luna Fuse, MD  09/28/2022   This document may have been prepared by [facility] Voice Recognition software and as such may include unintentional dictation errors.

## 2022-10-15 ENCOUNTER — Other Ambulatory Visit: Payer: Self-pay | Admitting: Cardiovascular Disease

## 2022-10-15 DIAGNOSIS — H269 Unspecified cataract: Secondary | ICD-10-CM | POA: Diagnosis not present

## 2022-10-15 DIAGNOSIS — H5203 Hypermetropia, bilateral: Secondary | ICD-10-CM | POA: Diagnosis not present

## 2022-10-15 DIAGNOSIS — H524 Presbyopia: Secondary | ICD-10-CM | POA: Diagnosis not present

## 2022-10-15 DIAGNOSIS — R079 Chest pain, unspecified: Secondary | ICD-10-CM

## 2022-10-26 ENCOUNTER — Other Ambulatory Visit: Payer: Self-pay | Admitting: Cardiovascular Disease

## 2022-10-29 ENCOUNTER — Encounter: Payer: Self-pay | Admitting: Cardiovascular Disease

## 2022-10-29 ENCOUNTER — Ambulatory Visit (INDEPENDENT_AMBULATORY_CARE_PROVIDER_SITE_OTHER): Payer: 59 | Admitting: Cardiology

## 2022-10-29 VITALS — BP 118/72 | HR 82 | Ht 68.0 in | Wt 196.4 lb

## 2022-10-29 DIAGNOSIS — E785 Hyperlipidemia, unspecified: Secondary | ICD-10-CM

## 2022-10-29 DIAGNOSIS — I10 Essential (primary) hypertension: Secondary | ICD-10-CM | POA: Diagnosis not present

## 2022-10-29 NOTE — Progress Notes (Signed)
Cardiology Office Note   Date:  10/29/2022   ID:  Eric Guerrero, DOB 1950/09/23, MRN [id number]  PCP:  Eric Monday, MD  Cardiologist:  Marisue Ivan, NP      History of Present Illness: Eric Guerrero is a 72 y.o. male who presents for  Chief Complaint  Patient presents with   Follow-up    2 month follow up    Patient in office for routine cardiac exam. Denies chest pain, shortness of breath, dizziness, lower extremity edema.       Past Medical History:  Diagnosis Date   Diabetes mellitus without complication (HCC)    Hyperlipidemia    Hypertension      Past Surgical History:  Procedure Laterality Date   LEFT HEART CATH AND CORONARY ANGIOGRAPHY Left 09/11/2020   Procedure: LEFT HEART CATH AND CORONARY ANGIOGRAPHY;  Surgeon: Laurier Nancy, MD;  Location: [facility] INV[facility] CV [facility];  Service: Cardiovascular;  Laterality: Left;     Current Outpatient Medications  Medication Sig Dispense Refill   losartan (COZAAR) 50 MG tablet Take 1 tablet by mouth once daily (Patient taking differently: Take 25 mg by mouth daily.) 90 tablet 0   ranolazine (RANEXA) 500 MG 12 hr tablet Take 1 tablet by mouth twice daily 60 tablet 0   rosuvastatin (CRESTOR) 10 MG tablet Take 10 mg by mouth daily.     SYNJARDY 07-998 MG TABS Take 1 tablet by mouth twice daily 180 tablet 0   No current facility-administered medications for this visit.    Allergies:   Patient has no known allergies.    Social History:   reports that he has never smoked. He has never used smokeless tobacco. He reports that he does not drink alcohol and does not use drugs.   Family History:  family history is not on file.    ROS:     Review of Systems  Constitutional: Negative.   HENT: Negative.    Eyes: Negative.   Respiratory: Negative.    Cardiovascular: Negative.   Gastrointestinal: Negative.   Genitourinary: Negative.   Musculoskeletal: Negative.   Skin: Negative.   Neurological: Negative.    Endo/Heme/Allergies: Negative.   Psychiatric/Behavioral: Negative.    All other systems reviewed and are negative.     All other systems are reviewed and negative.    PHYSICAL EXAM: VS:  BP 118/72 (BP Location: Left Arm, Patient Position: Sitting, Cuff Size: Large)   Pulse 82   Ht 5\' 8"  (1.727 m)   Wt 196 lb 6.4 oz (89.1 kg)   SpO2 96%   BMI 29.86 kg/m  , BMI Body mass index is 29.86 kg/m. Last weight:  Wt Readings from Last 3 Encounters:  10/29/22 196 lb 6.4 oz (89.1 kg)  09/28/22 197 lb (89.4 kg)  08/30/22 191 lb (86.6 kg)     Physical Exam Vitals and nursing note reviewed.  Constitutional:      Appearance: Normal appearance. He is normal weight.  HENT:     Head: Normocephalic and atraumatic.     Nose: Nose normal.     Mouth/Throat:     Mouth: Mucous membranes are moist.     Pharynx: Oropharynx is clear.  Eyes:     Extraocular Movements: Extraocular movements intact.     Conjunctiva/sclera: Conjunctivae normal.     Pupils: Pupils are equal, round, and reactive to light.  Cardiovascular:     Rate and Rhythm: Normal rate and regular rhythm.     Pulses: Normal pulses.  Heart sounds: Normal heart sounds.  Pulmonary:     Effort: Pulmonary effort is normal.     Breath sounds: Normal breath sounds.  Abdominal:     General: Abdomen is flat. Bowel sounds are normal.     Palpations: Abdomen is soft.  Musculoskeletal:        General: Normal range of motion.     Cervical back: Normal range of motion.  Skin:    General: Skin is warm and dry.  Neurological:     General: No focal deficit present.     Mental Status: He is alert and oriented to person, place, and time. Mental status is at baseline.  Psychiatric:        Mood and Affect: Mood normal.        Behavior: Behavior normal.        Thought Content: Thought content normal.        Judgment: Judgment normal.      EKG: none today  Recent Labs: 11/04/2021: Magnesium 2.3 09/27/2022: ALT 23; BUN 19; Creatinine,  Ser 1.30; Potassium 5.1; Sodium 138    Lipid Panel    Component Value Date/Time   CHOL 247 (H) 09/27/2022 0935   TRIG 256 (H) 09/27/2022 0935   HDL 38 (L) 09/27/2022 0935   LDLCALC 161 (H) 09/27/2022 0935      ASSESSMENT AND PLAN:    ICD-10-CM   1. Primary hypertension  I10     2. Hyperlipidemia, unspecified hyperlipidemia type  E78.5        Problem List Items Addressed This Visit       Cardiovascular and Mediastinum   Primary hypertension - Primary    Well controlled. Patient was hypotensive and symptomatic after increase in losartan. Reduced back to 25 mg daily.       Relevant Medications   rosuvastatin (CRESTOR) 10 MG tablet     Other   Hyperlipidemia   Relevant Medications   rosuvastatin (CRESTOR) 10 MG tablet     Disposition:   Return in about 4 months (around 02/28/2023).    Total time spent: 30 minutes  Signed,  [employer], NP  10/29/2022 11:12 AM    [facility]

## 2022-10-29 NOTE — Assessment & Plan Note (Signed)
Well controlled. Patient was hypotensive and symptomatic after increase in losartan. Reduced back to 25 mg daily.

## 2022-12-02 ENCOUNTER — Other Ambulatory Visit: Payer: Self-pay | Admitting: Internal Medicine

## 2022-12-02 ENCOUNTER — Other Ambulatory Visit: Payer: Self-pay | Admitting: Cardiovascular Disease

## 2022-12-05 ENCOUNTER — Other Ambulatory Visit: Payer: Self-pay | Admitting: Internal Medicine

## 2022-12-05 DIAGNOSIS — E119 Type 2 diabetes mellitus without complications: Secondary | ICD-10-CM

## 2023-01-26 DIAGNOSIS — K0530 Chronic periodontitis, unspecified: Secondary | ICD-10-CM | POA: Diagnosis not present

## 2023-01-26 DIAGNOSIS — Z1389 Encounter for screening for other disorder: Secondary | ICD-10-CM | POA: Diagnosis not present

## 2023-01-31 ENCOUNTER — Ambulatory Visit: Payer: 59 | Admitting: Internal Medicine

## 2023-02-21 ENCOUNTER — Other Ambulatory Visit: Payer: 59

## 2023-02-21 ENCOUNTER — Other Ambulatory Visit: Payer: Self-pay

## 2023-02-21 DIAGNOSIS — E119 Type 2 diabetes mellitus without complications: Secondary | ICD-10-CM

## 2023-02-21 DIAGNOSIS — I10 Essential (primary) hypertension: Secondary | ICD-10-CM

## 2023-02-21 DIAGNOSIS — E782 Mixed hyperlipidemia: Secondary | ICD-10-CM | POA: Diagnosis not present

## 2023-02-22 ENCOUNTER — Encounter: Payer: Self-pay | Admitting: Internal Medicine

## 2023-02-22 ENCOUNTER — Ambulatory Visit (INDEPENDENT_AMBULATORY_CARE_PROVIDER_SITE_OTHER): Payer: 59 | Admitting: Internal Medicine

## 2023-02-22 VITALS — BP 132/80 | HR 67 | Ht 68.0 in | Wt 196.8 lb

## 2023-02-22 DIAGNOSIS — Z0001 Encounter for general adult medical examination with abnormal findings: Secondary | ICD-10-CM

## 2023-02-22 DIAGNOSIS — Z0130 Encounter for examination of blood pressure without abnormal findings: Secondary | ICD-10-CM

## 2023-02-22 DIAGNOSIS — Z1331 Encounter for screening for depression: Secondary | ICD-10-CM

## 2023-02-22 DIAGNOSIS — F419 Anxiety disorder, unspecified: Secondary | ICD-10-CM | POA: Diagnosis not present

## 2023-02-22 DIAGNOSIS — E782 Mixed hyperlipidemia: Secondary | ICD-10-CM | POA: Diagnosis not present

## 2023-02-22 DIAGNOSIS — E7800 Pure hypercholesterolemia, unspecified: Secondary | ICD-10-CM

## 2023-02-22 DIAGNOSIS — E119 Type 2 diabetes mellitus without complications: Secondary | ICD-10-CM | POA: Diagnosis not present

## 2023-02-22 LAB — COMPREHENSIVE METABOLIC PANEL
ALT: 18 [IU]/L (ref 0–44)
AST: 19 [IU]/L (ref 0–40)
Albumin: 4.3 g/dL (ref 3.8–4.8)
Alkaline Phosphatase: 108 [IU]/L (ref 44–121)
BUN/Creatinine Ratio: 12 (ref 10–24)
BUN: 16 mg/dL (ref 8–27)
Bilirubin Total: 0.4 mg/dL (ref 0.0–1.2)
CO2: 19 mmol/L — ABNORMAL LOW (ref 20–29)
Calcium: 9.6 mg/dL (ref 8.6–10.2)
Chloride: 106 mmol/L (ref 96–106)
Creatinine, Ser: 1.29 mg/dL — ABNORMAL HIGH (ref 0.76–1.27)
Globulin, Total: 2.6 g/dL (ref 1.5–4.5)
Glucose: 121 mg/dL — ABNORMAL HIGH (ref 70–99)
Potassium: 4.9 mmol/L (ref 3.5–5.2)
Sodium: 142 mmol/L (ref 134–144)
Total Protein: 6.9 g/dL (ref 6.0–8.5)
eGFR: 59 mL/min/{1.73_m2} — ABNORMAL LOW (ref >59)

## 2023-02-22 LAB — LIPID PANEL
Chol/HDL Ratio: 4 {ratio} (ref 0.0–5.0)
Cholesterol, Total: 166 mg/dL (ref 100–199)
HDL: 41 mg/dL (ref >39)
LDL Chol Calc (NIH): 108 mg/dL — ABNORMAL HIGH (ref 0–99)
Triglycerides: 92 mg/dL (ref 0–149)
VLDL Cholesterol Cal: 17 mg/dL (ref 5–40)

## 2023-02-22 LAB — HEMOGLOBIN A1C
Est. average glucose Bld gHb Est-mCnc: 143 mg/dL
Hgb A1c MFr Bld: 6.6 % — ABNORMAL HIGH (ref 4.8–5.6)

## 2023-02-22 LAB — CK: Total CK: 157 U/L (ref 41–331)

## 2023-02-22 MED ORDER — SYNJARDY 5-1000 MG PO TABS: 1.0000 | ORAL_TABLET | Freq: Two times a day (BID) | ORAL | 0 refills | Status: DC

## 2023-02-22 MED ORDER — ONETOUCH VERIO VI STRP: 1.0000 | ORAL_STRIP | Freq: Two times a day (BID) | 3 refills | Status: DC

## 2023-02-22 MED ORDER — ROSUVASTATIN CALCIUM 20 MG PO TABS: 20.0000 mg | ORAL_TABLET | Freq: Every evening | ORAL | 0 refills | Status: DC

## 2023-02-22 MED ORDER — ONETOUCH VERIO W/DEVICE KIT: 1.0000 | PACK | Freq: Every day | 0 refills | Status: AC

## 2023-02-22 NOTE — Progress Notes (Signed)
Established Patient Office Visit  Subjective:  Patient ID: Eric Guerrero, male    DOB: 07/20/1950  Age: 72 y.o. MRN: [id number]  No chief complaint on file.   No new complaints, here for AWV refer to quality metrics and scanned documents. No new complaints, here for lab review and medication refills. Labs reviewed and notable for well controlled diabetes, A1c at target, lipids improved but LDL not at target with cmp notable for stable ckd. Denies any hypoglycemic episodes and home bg readings have been at target.     No other concerns at this time.   Past Medical History:  Diagnosis Date   Diabetes mellitus without complication (HCC)    Hyperlipidemia    Hypertension     Past Surgical History:  Procedure Laterality Date   LEFT HEART CATH AND CORONARY ANGIOGRAPHY Left 09/11/2020   Procedure: LEFT HEART CATH AND CORONARY ANGIOGRAPHY;  Surgeon: Laurier Nancy, MD;  Location: [facility] IN[facility] CV [facility];  Service: Cardiovascular;  Laterality: Left;    Social History   Socioeconomic History   Marital status: Unknown    Spouse name: Not on file   Number of children: Not on file   Years of education: Not on file   Highest education level: Not on file  Occupational History   Not on file  Tobacco Use   Smoking status: Never   Smokeless tobacco: Never  Substance and Sexual Activity   Alcohol use: Never   Drug use: Never   Sexual activity: Not on file  Other Topics Concern   Not on file  Social History Narrative   Not on file   Social Determinants of Health   Financial Resource Strain: Not on file  Food Insecurity: Not on file  Transportation Needs: Not on file  Physical Activity: Not on file  Stress: Not on file  Social Connections: Not on file  Intimate Partner Violence: Not on file    No family history on file.  No Known Allergies  Outpatient Medications Prior to Visit  Medication Sig   losartan (COZAAR) 50 MG tablet Take 1 tablet by mouth once daily (Patient  taking differently: Take 25 mg by mouth daily.)   ONETOUCH VERIO test strip 1 each by Other route 2 (two) times daily.   pantoprazole (PROTONIX) 40 MG tablet Take 1 tablet by mouth once daily   ranolazine (RANEXA) 500 MG 12 hr tablet Take 1 tablet by mouth twice daily   [DISCONTINUED] rosuvastatin (CRESTOR) 10 MG tablet Take 10 mg by mouth daily.   [DISCONTINUED] SYNJARDY 07-998 MG TABS Take 1 tablet by mouth twice daily   No facility-administered medications prior to visit.    Review of Systems  Constitutional: Negative.   HENT: Negative.    Eyes: Negative.   Respiratory: Negative.    Cardiovascular: Negative.   Gastrointestinal: Negative.   Genitourinary: Negative.  Negative for frequency and urgency.  Musculoskeletal:  Positive for back pain and joint pain.  Skin: Negative.   Neurological: Negative.   Endo/Heme/Allergies: Negative.   Psychiatric/Behavioral:  The patient is nervous/anxious.        Objective:   BP 132/80   Pulse 67   Ht 5\' 8"  (1.727 m)   Wt 196 lb 12.8 oz (89.3 kg)   SpO2 98%   BMI 29.92 kg/m   Vitals:   02/22/23 1051  BP: 132/80  Pulse: 67  Height: 5\' 8"  (1.727 m)  Weight: 196 lb 12.8 oz (89.3 kg)  SpO2: 98%  BMI (Calculated): 29.93  Physical Exam Vitals reviewed.  Constitutional:      Appearance: Normal appearance.  HENT:     Head: Normocephalic.     Left Ear: There is no impacted cerumen.     Nose: Nose normal.     Mouth/Throat:     Mouth: Mucous membranes are moist.     Pharynx: No posterior oropharyngeal erythema.  Eyes:     Extraocular Movements: Extraocular movements intact.     Pupils: Pupils are equal, round, and reactive to light.  Cardiovascular:     Rate and Rhythm: Regular rhythm.     Chest Wall: PMI is not displaced.     Pulses: Normal pulses.     Heart sounds: Normal heart sounds. No murmur heard. Pulmonary:     Effort: Pulmonary effort is normal.     Breath sounds: Normal air entry. No rhonchi or rales.   Abdominal:     General: Abdomen is flat. Bowel sounds are normal. There is no distension.     Palpations: Abdomen is soft. There is no hepatomegaly, splenomegaly or mass.     Tenderness: There is no abdominal tenderness.  Musculoskeletal:        General: Normal range of motion.     Cervical back: Normal range of motion and neck supple.     Right lower leg: No edema.     Left lower leg: No edema.  Skin:    General: Skin is warm and dry.  Neurological:     General: No focal deficit present.     Mental Status: He is alert and oriented to person, place, and time.     Cranial Nerves: No cranial nerve deficit.     Motor: No weakness.  Psychiatric:        Mood and Affect: Mood normal.        Behavior: Behavior normal.      No results found for any visits on 02/22/23.  Recent Results (from the past 2160 hour([place]))  CK     Status: None   Collection Time: 02/21/23  2:52 PM  Result Value Ref Range   Total CK 157 41 - 331 U/L  Lipid panel     Status: Abnormal   Collection Time: 02/21/23  2:52 PM  Result Value Ref Range   Cholesterol, Total 166 100 - 199 mg/dL   Triglycerides 92 0 - 149 mg/dL   HDL 41 >65 mg/dL   VLDL Cholesterol Cal 17 5 - 40 mg/dL   LDL Chol Calc (NIH) 784 (H) 0 - 99 mg/dL   Chol/HDL Ratio 4.0 0.0 - 5.0 ratio    Comment:                                   T. Chol/HDL Ratio                                             Men  Women                               1/2 Avg.Risk  3.4    3.3  Avg.Risk  5.0    4.4                                2X Avg.Risk  9.6    7.1                                3X Avg.Risk 23.4   11.0   Comprehensive metabolic panel     Status: Abnormal   Collection Time: 02/21/23  2:52 PM  Result Value Ref Range   Glucose 121 (H) 70 - 99 mg/dL   BUN 16 8 - 27 mg/dL   Creatinine, Ser 8.29 (H) 0.76 - 1.27 mg/dL   eGFR 59 (L) >56 OZ/HYQ/6.57   BUN/Creatinine Ratio 12 10 - 24   Sodium 142 134 - 144 mmol/L   Potassium  4.9 3.5 - 5.2 mmol/L   Chloride 106 96 - 106 mmol/L   CO2 19 (L) 20 - 29 mmol/L   Calcium 9.6 8.6 - 10.2 mg/dL   Total Protein 6.9 6.0 - 8.5 g/dL   Albumin 4.3 3.8 - 4.8 g/dL   Globulin, Total 2.6 1.5 - 4.5 g/dL   Bilirubin Total 0.4 0.0 - 1.2 mg/dL   Alkaline Phosphatase 108 44 - 121 IU/L   AST 19 0 - 40 IU/L   ALT 18 0 - 44 IU/L  Hemoglobin A1c     Status: Abnormal   Collection Time: 02/21/23  2:52 PM  Result Value Ref Range   Hgb A1c MFr Bld 6.6 (H) 4.8 - 5.6 %    Comment:          Prediabetes: 5.7 - 6.4          Diabetes: >6.4          Glycemic control for adults with diabetes: <7.0    Est. average glucose Bld gHb Est-mCnc 143 mg/dL      Assessment & Plan:  As per problem list. Declines anxiolytic today. Problem List Items Addressed This Visit       Endocrine   Controlled type 2 diabetes mellitus without complication, without long-term current use of insulin (HCC)   Relevant Medications   rosuvastatin (CRESTOR) 20 MG tablet   Blood Glucose Monitoring Suppl (ONETOUCH VERIO) w/Device KIT   Empagliflozin-metFORMIN HCl (SYNJARDY) 07-998 MG TABS     Other   Hyperlipidemia - Primary   Relevant Medications   rosuvastatin (CRESTOR) 20 MG tablet   Anxiety    Return in about 3 months (around 05/25/2023) for fu with labs prior.   Total time spent: 30 minutes  Luna Fuse, MD  02/22/2023   This document may have been prepared by [facility] Voice Recognition software and as such may include unintentional dictation errors.

## 2023-02-28 ENCOUNTER — Ambulatory Visit (INDEPENDENT_AMBULATORY_CARE_PROVIDER_SITE_OTHER): Payer: 59 | Admitting: Cardiovascular Disease

## 2023-02-28 ENCOUNTER — Encounter: Payer: Self-pay | Admitting: Cardiovascular Disease

## 2023-02-28 VITALS — BP 122/82 | HR 84 | Ht 68.0 in | Wt 196.0 lb

## 2023-02-28 DIAGNOSIS — I70218 Atherosclerosis of native arteries of extremities with intermittent claudication, other extremity: Secondary | ICD-10-CM | POA: Diagnosis not present

## 2023-02-28 DIAGNOSIS — E119 Type 2 diabetes mellitus without complications: Secondary | ICD-10-CM

## 2023-02-28 DIAGNOSIS — I739 Peripheral vascular disease, unspecified: Secondary | ICD-10-CM | POA: Diagnosis not present

## 2023-02-28 DIAGNOSIS — I10 Essential (primary) hypertension: Secondary | ICD-10-CM

## 2023-02-28 NOTE — Progress Notes (Signed)
Cardiology Office Note   Date:  02/28/2023   ID:  Eric Guerrero, DOB 1950/12/20, MRN [id number]  PCP:  Sherron Monday, MD  Cardiologist:  Adrian Blackwater, MD      History of Present Illness: Eric Guerrero is a 72 y.o. male who presents for  Chief Complaint  Patient presents with   Follow-up    4 month follow up    Doing better      Past Medical History:  Diagnosis Date   Diabetes mellitus without complication (HCC)    Hyperlipidemia    Hypertension      Past Surgical History:  Procedure Laterality Date   LEFT HEART CATH AND CORONARY ANGIOGRAPHY Left 09/11/2020   Procedure: LEFT HEART CATH AND CORONARY ANGIOGRAPHY;  Surgeon: Laurier Nancy, MD;  Location: [facility];  Service: Cardiovascular;  Laterality: Left;     Current Outpatient Medications  Medication Sig Dispense Refill   Blood Glucose Monitoring Suppl (ONETOUCH VERIO) w/Device KIT 1 Device by Does not apply route daily. 1 kit 0   Empagliflozin-metFORMIN HCl (SYNJARDY) 07-998 MG TABS Take 1 tablet by mouth 2 (two) times daily. 180 tablet 0   losartan (COZAAR) 25 MG tablet Take 25 mg by mouth daily.     ONETOUCH VERIO test strip 1 each by Other route 2 (two) times daily. 180 each 3   pantoprazole (PROTONIX) 40 MG tablet Take 1 tablet by mouth once daily 90 tablet 0   ranolazine (RANEXA) 500 MG 12 hr tablet Take 1 tablet by mouth twice daily 60 tablet 0   rosuvastatin (CRESTOR) 20 MG tablet Take 1 tablet (20 mg total) by mouth at bedtime. 90 tablet 0   No current facility-administered medications for this visit.    Allergies:   Patient has no known allergies.    Social History:   reports that he has never smoked. He has never used smokeless tobacco. He reports that he does not drink alcohol and does not use drugs.   Family History:  family history is not on file.    ROS:     Review of Systems  Constitutional: Negative.   HENT: Negative.    Eyes: Negative.   Respiratory: Negative.     Gastrointestinal: Negative.   Genitourinary: Negative.   Musculoskeletal: Negative.   Skin: Negative.   Neurological: Negative.   Endo/Heme/Allergies: Negative.   Psychiatric/Behavioral: Negative.    All other systems reviewed and are negative.     All other systems are reviewed and negative.    PHYSICAL EXAM: VS:  BP 122/82   Pulse 84   Ht 5\' 8"  (1.727 m)   Wt 196 lb (88.9 kg)   SpO2 92%   BMI 29.80 kg/m  , BMI Body mass index is 29.8 kg/m. Last weight:  Wt Readings from Last 3 Encounters:  02/28/23 196 lb (88.9 kg)  02/22/23 196 lb 12.8 oz (89.3 kg)  10/29/22 196 lb 6.4 oz (89.1 kg)     Physical Exam Vitals reviewed.  Constitutional:      Appearance: Normal appearance. He is normal weight.  HENT:     Head: Normocephalic.     Nose: Nose normal.     Mouth/Throat:     Mouth: Mucous membranes are moist.  Eyes:     Pupils: Pupils are equal, round, and reactive to light.  Cardiovascular:     Rate and Rhythm: Normal rate and regular rhythm.     Pulses: Normal pulses.     Heart  sounds: Normal heart sounds.  Pulmonary:     Effort: Pulmonary effort is normal.  Abdominal:     General: Abdomen is flat. Bowel sounds are normal.  Musculoskeletal:        General: Normal range of motion.     Cervical back: Normal range of motion.  Skin:    General: Skin is warm.  Neurological:     General: No focal deficit present.     Mental Status: He is alert.  Psychiatric:        Mood and Affect: Mood normal.       EKG:   Recent Labs: 02/21/2023: ALT 18; BUN 16; Creatinine, Ser 1.29; Potassium 4.9; Sodium 142    Lipid Panel    Component Value Date/Time   CHOL 166 02/21/2023 1452   TRIG 92 02/21/2023 1452   HDL 41 02/21/2023 1452   CHOLHDL 4.0 02/21/2023 1452   LDLCALC 108 (H) 02/21/2023 1452      Other studies Reviewed: Additional studies/ records that were reviewed today include:  Review of the above records demonstrates:       No data to display             ASSESSMENT AND PLAN:    ICD-10-CM   1. Primary hypertension  I10    on 25 mg losartan, doing well    2. PAD (peripheral artery disease) (HCC)  I73.9     3. Claudication of left upper extremity due to atherosclerosis (HCC)  I70.218     4. Controlled type 2 diabetes mellitus without complication, without long-term current use of insulin (HCC)  E11.9        Problem List Items Addressed This Visit       Cardiovascular and Mediastinum   Claudication of left upper extremity due to atherosclerosis (HCC) (Chronic)   Relevant Medications   losartan (COZAAR) 25 MG tablet   PAD (peripheral artery disease) (HCC)   Relevant Medications   losartan (COZAAR) 25 MG tablet   Primary hypertension - Primary   Relevant Medications   losartan (COZAAR) 25 MG tablet     Endocrine   Controlled type 2 diabetes mellitus without complication, without long-term current use of insulin (HCC)   Relevant Medications   losartan (COZAAR) 25 MG tablet       Disposition:   Return in about 3 months (around 05/29/2023).    Total time spent: 30 minutes  Signed,  Adrian Blackwater, MD  02/28/2023 10:48 AM    [facility]

## 2023-03-01 ENCOUNTER — Other Ambulatory Visit: Payer: Self-pay | Admitting: Cardiovascular Disease

## 2023-04-12 ENCOUNTER — Telehealth: Payer: Self-pay | Admitting: Internal Medicine

## 2023-04-12 NOTE — Telephone Encounter (Signed)
 Patient's wife left VM requesting a call back. Did not state what she needs.

## 2023-04-14 ENCOUNTER — Other Ambulatory Visit: Payer: Self-pay | Admitting: Internal Medicine

## 2023-04-14 DIAGNOSIS — E119 Type 2 diabetes mellitus without complications: Secondary | ICD-10-CM

## 2023-05-26 ENCOUNTER — Other Ambulatory Visit: Payer: Self-pay | Admitting: Internal Medicine

## 2023-05-26 ENCOUNTER — Other Ambulatory Visit: Payer: 59

## 2023-05-26 DIAGNOSIS — E119 Type 2 diabetes mellitus without complications: Secondary | ICD-10-CM | POA: Diagnosis not present

## 2023-05-26 DIAGNOSIS — E7800 Pure hypercholesterolemia, unspecified: Secondary | ICD-10-CM | POA: Diagnosis not present

## 2023-05-27 ENCOUNTER — Encounter: Payer: Self-pay | Admitting: Internal Medicine

## 2023-05-27 ENCOUNTER — Ambulatory Visit: Payer: 59 | Admitting: Internal Medicine

## 2023-05-27 VITALS — BP 127/74 | HR 79 | Temp 98.3°F | Ht 68.0 in | Wt 190.0 lb

## 2023-05-27 DIAGNOSIS — E782 Mixed hyperlipidemia: Secondary | ICD-10-CM | POA: Diagnosis not present

## 2023-05-27 DIAGNOSIS — E119 Type 2 diabetes mellitus without complications: Secondary | ICD-10-CM | POA: Diagnosis not present

## 2023-05-27 DIAGNOSIS — Z0130 Encounter for examination of blood pressure without abnormal findings: Secondary | ICD-10-CM

## 2023-05-27 LAB — LIPID PANEL
Chol/HDL Ratio: 3.9 ratio (ref 0.0–5.0)
Cholesterol, Total: 144 mg/dL (ref 100–199)
HDL: 37 mg/dL — ABNORMAL LOW (ref >39)
LDL Chol Calc (NIH): 83 mg/dL (ref 0–99)
Triglycerides: 137 mg/dL (ref 0–149)
VLDL Cholesterol Cal: 24 mg/dL (ref 5–40)

## 2023-05-27 LAB — COMPREHENSIVE METABOLIC PANEL
ALT: 22 IU/L (ref 0–44)
AST: 19 IU/L (ref 0–40)
Albumin: 4.1 g/dL (ref 3.8–4.8)
Alkaline Phosphatase: 103 IU/L (ref 44–121)
BUN/Creatinine Ratio: 17 (ref 10–24)
BUN: 19 mg/dL (ref 8–27)
Bilirubin Total: 0.6 mg/dL (ref 0.0–1.2)
CO2: 23 mmol/L (ref 20–29)
Calcium: 9.3 mg/dL (ref 8.6–10.2)
Chloride: 104 mmol/L (ref 96–106)
Creatinine, Ser: 1.13 mg/dL (ref 0.76–1.27)
Globulin, Total: 2.6 g/dL (ref 1.5–4.5)
Glucose: 114 mg/dL — ABNORMAL HIGH (ref 70–99)
Potassium: 4.6 mmol/L (ref 3.5–5.2)
Sodium: 141 mmol/L (ref 134–144)
Total Protein: 6.7 g/dL (ref 6.0–8.5)
eGFR: 69 mL/min/{1.73_m2} (ref >59)

## 2023-05-27 LAB — HEMOGLOBIN A1C
Est. average glucose Bld gHb Est-mCnc: 143 mg/dL
Hgb A1c MFr Bld: 6.6 % — ABNORMAL HIGH (ref 4.8–5.6)

## 2023-05-27 LAB — CK: Total CK: 153 U/L (ref 41–331)

## 2023-05-27 LAB — POCT CBG (FASTING - GLUCOSE)-MANUAL ENTRY: Glucose Fasting, POC: 147 mg/dL — AB (ref 70–99)

## 2023-05-27 MED ORDER — SYNJARDY 5-1000 MG PO TABS: 1.0000 | ORAL_TABLET | Freq: Two times a day (BID) | ORAL | 0 refills | Status: DC

## 2023-05-27 MED ORDER — ROSUVASTATIN CALCIUM 20 MG PO TABS: 20.0000 mg | ORAL_TABLET | Freq: Every evening | ORAL | 0 refills | Status: DC

## 2023-05-27 NOTE — Progress Notes (Signed)
 Established Patient Office Visit  Subjective:  Patient ID: Eric Guerrero, male    DOB: 01/07/1951  Age: 73 y.o. MRN: [id number]  Chief Complaint  Patient presents with   Follow-up    3 month follow up     No new complaints, here for lab review and medication refills. Labs reviewed and notable for well controlled diabetes, A1c stable at target, lipids at target with unremarkable cmp. Denies any hypoglycemic episodes and home bg readings have been at target.     No other concerns at this time.   Past Medical History:  Diagnosis Date   Diabetes mellitus without complication (HCC)    Hyperlipidemia    Hypertension     Past Surgical History:  Procedure Laterality Date   LEFT HEART CATH AND CORONARY ANGIOGRAPHY Left 09/11/2020   Procedure: LEFT HEART CATH AND CORONARY ANGIOGRAPHY;  Surgeon: Laurier Nancy, MD;  Location: [facility];  Service: Cardiovascular;  Laterality: Left;    Social History   Socioeconomic History   Marital status: Unknown    Spouse name: Not on file   Number of children: Not on file   Years of education: Not on file   Highest education level: Not on file  Occupational History   Not on file  Tobacco Use   Smoking status: Never   Smokeless tobacco: Never  Substance and Sexual Activity   Alcohol use: Never   Drug use: Never   Sexual activity: Not on file  Other Topics Concern   Not on file  Social History Narrative   Not on file   Social Drivers of Health   Financial Resource Strain: Not on file  Food Insecurity: Not on file  Transportation Needs: Not on file  Physical Activity: Not on file  Stress: Not on file  Social Connections: Not on file  Intimate Partner Violence: Not on file    No family history on file.  No Known Allergies  Outpatient Medications Prior to Visit  Medication Sig   glucose blood (ONETOUCH VERIO) test strip USE TO CHECK GLUCOSE TWICE DAILY   losartan (COZAAR) 25 MG tablet Take 25 mg by mouth daily.    ranolazine (RANEXA) 500 MG 12 hr tablet Take 1 tablet by mouth twice daily   [DISCONTINUED] Empagliflozin-metFORMIN HCl (SYNJARDY) 07-998 MG TABS Take 1 tablet by mouth 2 (two) times daily.   pantoprazole (PROTONIX) 40 MG tablet Take 1 tablet by mouth once daily (Patient not taking: Reported on 05/27/2023)   [DISCONTINUED] rosuvastatin (CRESTOR) 20 MG tablet Take 1 tablet (20 mg total) by mouth at bedtime.   No facility-administered medications prior to visit.    Review of Systems  Constitutional: Negative.   HENT: Negative.    Eyes: Negative.   Respiratory: Negative.    Cardiovascular: Negative.   Gastrointestinal: Negative.   Genitourinary: Negative.  Negative for frequency and urgency.  Musculoskeletal:  Positive for back pain and joint pain.  Skin: Negative.   Neurological: Negative.   Endo/Heme/Allergies: Negative.   Psychiatric/Behavioral:  The patient is nervous/anxious.        Objective:   BP 127/74   Pulse 79   Temp 98.3 F (36.8 C)   Ht 5\' 8"  (1.727 m)   Wt 190 lb (86.2 kg)   SpO2 100%   BMI 28.89 kg/m   Vitals:   05/27/23 1014  BP: 127/74  Pulse: 79  Temp: 98.3 F (36.8 C)  Height: 5\' 8"  (1.727 m)  Weight: 190 lb (86.2 kg)  SpO2: 100%  BMI (Calculated): 28.9    Physical Exam Vitals reviewed.  Constitutional:      Appearance: Normal appearance.  HENT:     Head: Normocephalic.     Left Ear: There is no impacted cerumen.     Nose: Nose normal.     Mouth/Throat:     Mouth: Mucous membranes are moist.     Pharynx: No posterior oropharyngeal erythema.  Eyes:     Extraocular Movements: Extraocular movements intact.     Pupils: Pupils are equal, round, and reactive to light.  Cardiovascular:     Rate and Rhythm: Regular rhythm.     Chest Wall: PMI is not displaced.     Pulses: Normal pulses.     Heart sounds: Normal heart sounds. No murmur heard. Pulmonary:     Effort: Pulmonary effort is normal.     Breath sounds: Normal air entry. Rales  (scanty basal) present. No rhonchi.  Abdominal:     General: Abdomen is flat. Bowel sounds are normal. There is no distension.     Palpations: Abdomen is soft. There is no hepatomegaly, splenomegaly or mass.     Tenderness: There is no abdominal tenderness.  Musculoskeletal:        General: Normal range of motion.     Cervical back: Normal range of motion and neck supple.     Right lower leg: No edema.     Left lower leg: No edema.  Skin:    General: Skin is warm and dry.  Neurological:     General: No focal deficit present.     Mental Status: He is alert and oriented to person, place, and time.     Cranial Nerves: No cranial nerve deficit.     Motor: No weakness.  Psychiatric:        Mood and Affect: Mood normal.        Behavior: Behavior normal.      Results for orders placed or performed in visit on 05/27/23  POCT CBG (Fasting - Glucose)  Result Value Ref Range   Glucose Fasting, POC 147 (A) 70 - 99 mg/dL    Recent Results (from the past 2160 hours)  Comprehensive metabolic panel     Status: Abnormal   Collection Time: 05/26/23 10:00 AM  Result Value Ref Range   Glucose 114 (H) 70 - 99 mg/dL   BUN 19 8 - 27 mg/dL   Creatinine, Ser 1.61 0.76 - 1.27 mg/dL   eGFR 69 >09 UE/AVW/0.98   BUN/Creatinine Ratio 17 10 - 24   Sodium 141 134 - 144 mmol/L   Potassium 4.6 3.5 - 5.2 mmol/L   Chloride 104 96 - 106 mmol/L   CO2 23 20 - 29 mmol/L   Calcium 9.3 8.6 - 10.2 mg/dL   Total Protein 6.7 6.0 - 8.5 g/dL   Albumin 4.1 3.8 - 4.8 g/dL   Globulin, Total 2.6 1.5 - 4.5 g/dL   Bilirubin Total 0.6 0.0 - 1.2 mg/dL   Alkaline Phosphatase 103 44 - 121 IU/L   AST 19 0 - 40 IU/L   ALT 22 0 - 44 IU/L  Lipid panel     Status: Abnormal   Collection Time: 05/26/23 10:00 AM  Result Value Ref Range   Cholesterol, Total 144 100 - 199 mg/dL   Triglycerides 119 0 - 149 mg/dL   HDL 37 (L) >14 mg/dL   VLDL Cholesterol Cal 24 5 - 40 mg/dL   LDL Chol Calc (NIH) 83  0 - 99 mg/dL   Chol/HDL  Ratio 3.9 0.0 - 5.0 ratio    Comment:                                   T. Chol/HDL Ratio                                             Men  Women                               1/2 Avg.Risk  3.4    3.3                                   Avg.Risk  5.0    4.4                                2X Avg.Risk  9.6    7.1                                3X Avg.Risk 23.4   11.0   Hemoglobin A1c     Status: Abnormal   Collection Time: 05/26/23 10:00 AM  Result Value Ref Range   Hgb A1c MFr Bld 6.6 (H) 4.8 - 5.6 %    Comment:          Prediabetes: 5.7 - 6.4          Diabetes: >6.4          Glycemic control for adults with diabetes: <7.0    Est. average glucose Bld gHb Est-mCnc 143 mg/dL  CK     Status: None   Collection Time: 05/26/23 10:00 AM  Result Value Ref Range   Total CK 153 41 - 331 U/L  POCT CBG (Fasting - Glucose)     Status: Abnormal   Collection Time: 05/27/23 10:21 AM  Result Value Ref Range   Glucose Fasting, POC 147 (A) 70 - 99 mg/dL      Assessment & Plan:  As per problem list. The current medical regimen is effective;  continue present plan and medications.  Problem List Items Addressed This Visit       Endocrine   Controlled type 2 diabetes mellitus without complication, without long-term current use of insulin (HCC) - Primary   Relevant Medications   Empagliflozin-metFORMIN HCl (SYNJARDY) 07-998 MG TABS   rosuvastatin (CRESTOR) 20 MG tablet   Other Relevant Orders   POCT CBG (Fasting - Glucose) (Completed)   Hemoglobin A1c     Other   Hyperlipidemia   Relevant Medications   rosuvastatin (CRESTOR) 20 MG tablet   Other Relevant Orders   Lipid panel   Comprehensive metabolic panel    Return in about 3 months (around 08/24/2023) for fu with labs prior.   Total time spent: 20 minutes  Luna Fuse, MD  05/27/2023   This document may have been prepared by [facility] Voice Recognition software and as such may include unintentional dictation errors.

## 2023-05-29 ENCOUNTER — Other Ambulatory Visit: Payer: Self-pay | Admitting: Cardiovascular Disease

## 2023-06-04 ENCOUNTER — Other Ambulatory Visit: Payer: Self-pay | Admitting: Internal Medicine

## 2023-06-04 DIAGNOSIS — E782 Mixed hyperlipidemia: Secondary | ICD-10-CM

## 2023-06-30 ENCOUNTER — Ambulatory Visit: Payer: 59 | Admitting: Cardiovascular Disease

## 2023-07-21 ENCOUNTER — Ambulatory Visit (INDEPENDENT_AMBULATORY_CARE_PROVIDER_SITE_OTHER): Admitting: Cardiovascular Disease

## 2023-07-21 ENCOUNTER — Encounter: Payer: Self-pay | Admitting: Cardiovascular Disease

## 2023-07-21 VITALS — BP 142/68 | HR 79 | Ht 68.0 in | Wt 193.0 lb

## 2023-07-21 DIAGNOSIS — E782 Mixed hyperlipidemia: Secondary | ICD-10-CM | POA: Diagnosis not present

## 2023-07-21 DIAGNOSIS — R079 Chest pain, unspecified: Secondary | ICD-10-CM | POA: Diagnosis not present

## 2023-07-21 DIAGNOSIS — I70218 Atherosclerosis of native arteries of extremities with intermittent claudication, other extremity: Secondary | ICD-10-CM | POA: Diagnosis not present

## 2023-07-21 DIAGNOSIS — I10 Essential (primary) hypertension: Secondary | ICD-10-CM

## 2023-07-21 DIAGNOSIS — I739 Peripheral vascular disease, unspecified: Secondary | ICD-10-CM | POA: Diagnosis not present

## 2023-07-21 NOTE — Progress Notes (Signed)
 Cardiology Office Note   Date:  07/21/2023   ID:  Eric Guerrero, DOB 1950/11/15, MRN [id number]  PCP:  Shari Daughters, MD  Cardiologist:  Debborah Fairly, MD      History of Present Illness: Eric Guerrero is a 73 y.o. male who presents for  Chief Complaint  Patient presents with   Follow-up    4 Month Follow Up    Occasional chest pain      Past Medical History:  Diagnosis Date   Diabetes mellitus without complication (HCC)    Hyperlipidemia    Hypertension      Past Surgical History:  Procedure Laterality Date   LEFT HEART CATH AND CORONARY ANGIOGRAPHY Left 09/11/2020   Procedure: LEFT HEART CATH AND CORONARY ANGIOGRAPHY;  Surgeon: Cherrie Cornwall, MD;  Location: [facility];  Service: Cardiovascular;  Laterality: Left;     Current Outpatient Medications  Medication Sig Dispense Refill   Empagliflozin-metFORMIN HCl (SYNJARDY ) 07-998 MG TABS Take 1 tablet by mouth 2 (two) times daily. 180 tablet 0   glucose blood (ONETOUCH VERIO) test strip USE TO CHECK GLUCOSE TWICE DAILY 50 each 11   losartan  (COZAAR ) 25 MG tablet Take 25 mg by mouth daily.     pantoprazole  (PROTONIX ) 40 MG tablet Take 1 tablet by mouth once daily 90 tablet 0   ranolazine  (RANEXA ) 500 MG 12 hr tablet Take 1 tablet by mouth twice daily 60 tablet 0   rosuvastatin  (CRESTOR ) 20 MG tablet Take 1 tablet (20 mg total) by mouth at bedtime. 90 tablet 0   No current facility-administered medications for this visit.    Allergies:   Patient has no known allergies.    Social History:   reports that he has never smoked. He has never used smokeless tobacco. He reports that he does not drink alcohol and does not use drugs.   Family History:  family history is not on file.    ROS:     Review of Systems  Constitutional: Negative.   HENT: Negative.    Eyes: Negative.   Respiratory: Negative.    Gastrointestinal: Negative.   Genitourinary: Negative.   Musculoskeletal: Negative.   Skin:  Negative.   Neurological: Negative.   Endo/Heme/Allergies: Negative.   Psychiatric/Behavioral: Negative.    All other systems reviewed and are negative.     All other systems are reviewed and negative.    PHYSICAL EXAM: VS:  BP (!) 142/68   Pulse 79   Ht 5\' 8"  (1.727 m)   Wt 193 lb (87.5 kg)   SpO2 94%   BMI 29.35 kg/m  , BMI Body mass index is 29.35 kg/m. Last weight:  Wt Readings from Last 3 Encounters:  07/21/23 193 lb (87.5 kg)  05/27/23 190 lb (86.2 kg)  02/28/23 196 lb (88.9 kg)     Physical Exam Vitals reviewed.  Constitutional:      Appearance: Normal appearance. He is normal weight.  HENT:     Head: Normocephalic.     Nose: Nose normal.     Mouth/Throat:     Mouth: Mucous membranes are moist.  Eyes:     Pupils: Pupils are equal, round, and reactive to light.  Cardiovascular:     Rate and Rhythm: Normal rate and regular rhythm.     Pulses: Normal pulses.     Heart sounds: Normal heart sounds.  Pulmonary:     Effort: Pulmonary effort is normal.  Abdominal:     General: Abdomen is  flat. Bowel sounds are normal.  Musculoskeletal:        General: Normal range of motion.     Cervical back: Normal range of motion.  Skin:    General: Skin is warm.  Neurological:     General: No focal deficit present.     Mental Status: He is alert.  Psychiatric:        Mood and Affect: Mood normal.       EKG:   Recent Labs: 05/26/2023: ALT 22; BUN 19; Creatinine, Ser 1.13; Potassium 4.6; Sodium 141    Lipid Panel    Component Value Date/Time   CHOL 144 05/26/2023 1000   TRIG 137 05/26/2023 1000   HDL 37 (L) 05/26/2023 1000   CHOLHDL 3.9 05/26/2023 1000   LDLCALC 83 05/26/2023 1000      Other studies Reviewed: Additional studies/ records that were reviewed today include:  Review of the above records demonstrates:       No data to display            ASSESSMENT AND PLAN:    ICD-10-CM   1. Claudication of left upper extremity due to  atherosclerosis (HCC)  I70.218     2. PAD (peripheral artery disease) (HCC)  I73.9     3. Primary hypertension  I10    repeat BP120/70    4. Mixed hyperlipidemia  E78.2     5. Chest pain, unspecified type  R07.9    Non-cardiac chest pain. Advise getting pain managent Dr. Erminio Hazy as has left shoulder pain mostly.       Problem List Items Addressed This Visit       Cardiovascular and Mediastinum   Claudication of left upper extremity due to atherosclerosis (HCC) - Primary (Chronic)   PAD (peripheral artery disease) (HCC)   Primary hypertension     Other   Hyperlipidemia   Chest pain       Disposition:   Return in about 3 months (around 10/20/2023).    Total time spent: 30 minutes  Signed,  Debborah Fairly, MD  07/21/2023 10:32 AM    [facility]

## 2023-08-09 ENCOUNTER — Ambulatory Visit
Attending: Student in an Organized Health Care Education/Training Program | Admitting: Student in an Organized Health Care Education/Training Program

## 2023-08-09 ENCOUNTER — Encounter: Payer: Self-pay | Admitting: Student in an Organized Health Care Education/Training Program

## 2023-08-09 VITALS — BP 143/83 | HR 75 | Temp 97.3°F | Resp 16 | Ht 68.0 in | Wt 190.0 lb

## 2023-08-09 DIAGNOSIS — G8929 Other chronic pain: Secondary | ICD-10-CM | POA: Diagnosis not present

## 2023-08-09 DIAGNOSIS — G5702 Lesion of sciatic nerve, left lower limb: Secondary | ICD-10-CM | POA: Diagnosis not present

## 2023-08-09 DIAGNOSIS — M19012 Primary osteoarthritis, left shoulder: Secondary | ICD-10-CM | POA: Diagnosis not present

## 2023-08-09 DIAGNOSIS — M75102 Unspecified rotator cuff tear or rupture of left shoulder, not specified as traumatic: Secondary | ICD-10-CM | POA: Insufficient documentation

## 2023-08-09 DIAGNOSIS — M461 Sacroiliitis, not elsewhere classified: Secondary | ICD-10-CM | POA: Diagnosis not present

## 2023-08-09 DIAGNOSIS — M25512 Pain in left shoulder: Secondary | ICD-10-CM | POA: Diagnosis not present

## 2023-08-09 DIAGNOSIS — M533 Sacrococcygeal disorders, not elsewhere classified: Secondary | ICD-10-CM | POA: Diagnosis not present

## 2023-08-09 DIAGNOSIS — M12812 Other specific arthropathies, not elsewhere classified, left shoulder: Secondary | ICD-10-CM | POA: Insufficient documentation

## 2023-08-09 NOTE — Progress Notes (Signed)
 PROVIDER NOTE: Interpretation of information contained herein should be left to medically-trained personnel. Specific patient instructions are provided elsewhere under "Patient Instructions" section of medical record. This document was created in part using AI and STT-dictation technology, any transcriptional errors that may result from this process are unintentional.  Patient: Eric Guerrero  Service: E/M   PCP: Shari Daughters, MD  DOB: February 10, 1951  DOS: 08/09/2023  Provider: Cephus Collin, MD  MRN: [id number]  Delivery: Face-to-face  Specialty: Interventional Pain Management  Type: Established Patient  Setting: Ambulatory outpatient facility  Specialty designation: 09  Referring Prov.: Shari Daughters, MD  Location: Outpatient office facility       HPI  [name], a 73 y.o. year old male, is here today because of his SI joint arthritis (HCC) [M46.1]. [name] primary complain today is Hip Pain (Left ) and Shoulder Pain (left)  Pertinent problems: [name] has Hyperlipidemia; Pain in limb; PAD (peripheral artery disease) (HCC); Chronic hip pain (Left); Sacroiliac joint pain; Chronic shoulder pain (1ry area of Pain) (Left); Chronic pain syndrome; SI joint arthritis (left > right); and Lumbar facet arthropathy on their pertinent problem list. Pain Assessment: Severity of Chronic pain is reported as a 8 /10. Location: Shoulder Left/into left arm and up into neck. Onset: More than a month ago. Quality: Discomfort, Constant, Pressure. Timing: Constant. Modifying factor(s): rest or frequent breaks from activity. Vitals:  height is 5\' 8"  (1.727 m) and weight is 190 lb (86.2 kg). His temporal temperature is 97.3 F (36.3 C) (abnormal). His blood pressure is 143/83 (abnormal) and his pulse is 75. His respiration is 16 and oxygen saturation is 99%.  BMI: Estimated body mass index is 28.89 kg/m as calculated from the following:   Height as of this encounter: 5\' 8"  (1.727 m).   Weight as of this  encounter: 190 lb (86.2 kg). Last encounter: Visit date not found. Last procedure: Visit date not found.  Reason for encounter:  Discussed the use of AI scribe software for clinical note transcription with the patient, who gave verbal consent to proceed.  History of Present Illness   Eric Guerrero is a 74 year old male who presents with left shoulder and hip pain.  He experiences pain in his left shoulder, which starts from a specific point and radiates to the chest, causing a sensation of pressure. He describes the pain as 'very sweet pain' that requires him to stop activity, after which it subsides in a few minutes.  He also has pain in the left hip, specifically in the buttock area. He has a history of receiving an SI joint injection approximately one and a half years ago, which provided relief for that duration: 80% for 2years. However, the pain has since returned.   He is currently taking aspirin  as a blood thinner. He was previously on a stronger blood thinner but is no longer taking it. No use of stronger blood thinners like Plavix.     Procedural history: Left SI joint injection 06/09/2020; 80% pain relief for 2 years  ROS  Constitutional: Denies any fever or chills Gastrointestinal: No reported hemesis, hematochezia, vomiting, or acute GI distress Musculoskeletal: Left shoulder and left SI joint pain Neurological: No reported episodes of acute onset apraxia, aphasia, dysarthria, agnosia, amnesia, paralysis, loss of coordination, or loss of consciousness  Medication Review  Empagliflozin-metFORMIN HCl, glucose blood, losartan , pantoprazole , ranolazine , and rosuvastatin   History Review  Allergy: [name] has no known allergies. Drug: [name]  reports no  history of drug use. Alcohol:  reports no history of alcohol use. Tobacco:  reports that he has never smoked. He has never used smokeless tobacco. Social: Mr. Wojtaszek  reports that he has never smoked. He has never used smokeless  tobacco. He reports that he does not drink alcohol and does not use drugs. Medical:  has a past medical history of Diabetes mellitus without complication (HCC), Hyperlipidemia, and Hypertension. Surgical: [name]  has a past surgical history that includes LEFT HEART CATH AND CORONARY ANGIOGRAPHY (Left, 09/11/2020). Family: family history is not on file.  Laboratory Chemistry Profile   Renal Lab Results  Component Value Date   BUN 19 05/26/2023   CREATININE 1.13 05/26/2023   BCR 17 05/26/2023    Hepatic Lab Results  Component Value Date   AST 19 05/26/2023   ALT 22 05/26/2023   ALBUMIN 4.1 05/26/2023   ALKPHOS 103 05/26/2023    Electrolytes Lab Results  Component Value Date   NA 141 05/26/2023   K 4.6 05/26/2023   CL 104 05/26/2023   CALCIUM  9.3 05/26/2023   MG 2.3 11/04/2021    Bone Lab Results  Component Value Date   25OHVITD1 16 (L) 11/04/2021   25OHVITD2 <1.0 11/04/2021   25OHVITD3 16 11/04/2021    Inflammation (CRP: Acute Phase) (ESR: Chronic Phase) Lab Results  Component Value Date   CRP 5 11/04/2021   ESRSEDRATE 35 (H) 11/04/2021         Note: Above Lab results reviewed.  Recent Imaging Review   Narrative & Impression  CLINICAL DATA:  Posterior left hip pain for the past 2 years. No known injury.   EXAM: BILATERAL SACROILIAC JOINTS - 3+ VIEW   COMPARISON:  Left hip radiographs obtained at the same time.   FINDINGS: Mild sclerosis on both sides of the left sacroiliac joint and minimal sclerosis on the iliac side of the right sacroiliac joint. Lower lumbar spine degenerative changes.   IMPRESSION: 1. Mild left and minimal right sacroiliitis. 2. Lower lumbar spine degenerative changes.       Physical Exam  General appearance: Well nourished, well developed, and well hydrated. In no apparent acute distress Mental status: Alert, oriented x 3 (person, place, & time)       Respiratory: No evidence of acute respiratory distress Eyes:  PERLA Vitals: BP (!) 143/83 (BP Location: Right Arm, Patient Position: Sitting, Cuff Size: Normal)   Pulse 75   Temp (!) 97.3 F (36.3 C) (Temporal)   Resp 16   Ht 5\' 8"  (1.727 m)   Wt 190 lb (86.2 kg)   SpO2 99%   BMI 28.89 kg/m  BMI: Estimated body mass index is 28.89 kg/m as calculated from the following:   Height as of this encounter: 5\' 8"  (1.727 m).   Weight as of this encounter: 190 lb (86.2 kg). Ideal: Ideal body weight: 68.4 kg (150 lb 12.7 oz) Adjusted ideal body weight: 75.5 kg (166 lb 7.6 oz)  Upper Extremity (UE) Exam    Side: Right upper extremity  Side: Left upper extremity  Skin & Extremity Inspection: Skin color, temperature, and hair growth are WNL. No peripheral edema or cyanosis. No masses, redness, swelling, asymmetry, or associated skin lesions. No contractures.  Skin & Extremity Inspection: Skin color, temperature, and hair growth are WNL. No peripheral edema or cyanosis. No masses, redness, swelling, asymmetry, or associated skin lesions. No contractures.  Functional ROM: Pain restricted ROM for shoulder  Functional ROM: Unrestricted ROM  Muscle Tone/Strength: Functionally intact. No obvious neuro-muscular anomalies detected.  Muscle Tone/Strength: Functionally intact. No obvious neuro-muscular anomalies detected.  Sensory (Neurological): Musculoskeletal pain pattern          Sensory (Neurological): Unimpaired          Palpation: No palpable anomalies              Palpation: No palpable anomalies              Provocative Test(s):  Phalen's test: deferred Tinel's test: deferred Apley's scratch test (touch opposite shoulder):  Action 1 (Across chest): Decreased ROM Action 2 (Overhead): Decreased ROM Action 3 (LB reach): Decreased ROM   Provocative Test(s):  Phalen's test: deferred Tinel's test: deferred Apley's scratch test (touch opposite shoulder):  Action 1 (Across chest): deferred Action 2 (Overhead): deferred Action 3 (LB reach): deferred      Lumbar Spine Area Exam  Skin & Axial Inspection: No masses, redness, or swelling Alignment: Symmetrical Functional ROM: Unrestricted ROM       Stability: No instability detected Muscle Tone/Strength: Functionally intact. No obvious neuro-muscular anomalies detected. Sensory (Neurological): Musculoskeletal   Hyperextension/rotation test: deferred today       Lumbar quadrant test (Kemp's test): deferred today       Lateral bending test: deferred today       Patrick's Maneuver: (+) for left-sided S-I arthralgia             FABER* test: (+) for left-sided S-I arthralgia             S-I anterior distraction/compression test: (+)   S-I arthralgia/arthropathy S-I lateral compression test: (+) Left-sided S-I arthralgia/arthropathy S-I Thigh-thrust test: deferred today         S-I Gaenslen's test: deferred today         *(Flexion, ABduction and External Rotation) Tender to palpation overlying left sacroiliac joint   Lower Extremity Exam      Side: Right lower extremity   Side: Left lower extremity  Stability: No instability observed           Stability: No instability observed          Skin & Extremity Inspection: Skin color, temperature, and hair growth are WNL. No peripheral edema or cyanosis. No masses, redness, swelling, asymmetry, or associated skin lesions. No contractures.   Skin & Extremity Inspection: Skin color, temperature, and hair growth are WNL. No peripheral edema or cyanosis. No masses, redness, swelling, asymmetry, or associated skin lesions. No contractures.  Functional ROM: Unrestricted ROM                   Functional ROM: Unrestricted ROM                  Muscle Tone/Strength: Functionally intact. No obvious neuro-muscular anomalies detected.   Muscle Tone/Strength: Functionally intact. No obvious neuro-muscular anomalies detected.  Sensory (Neurological): Unimpaired         Sensory (Neurological): Unimpaired        DTR: Patellar: deferred today Achilles: deferred  today Plantar: deferred today   DTR: Patellar: deferred today Achilles: deferred today Plantar: deferred today       Assessment   Diagnosis Status  1. SI joint arthritis (left > right)   2. Sacroiliac joint pain   3. Piriformis syndrome (Left)   4. Rotator cuff tear arthropathy (Left)   5. Chronic left shoulder pain   6. Primary osteoarthritis of left shoulder    Controlled Controlled Controlled   Updated Problems:  Problem  Sacroiliac Joint Pain    Plan of Care  Assessment and Plan    Left SI joint pain   Chronic left SI joint pain has recurred after initial relief from a previous injection. The injection by the current provider was more effective than the previous one. Pain has returned, affecting the buttock area. Schedule an SI joint injection left and piriformis TPI left. Left shoulder pain   Intermittent left shoulder pain radiates from the neck, described as a 'sweet pain'. It is not constant and subsides after a few minutes, possibly involving nerve issues. Plan a left suprascapular nerve block to assess and potentially alleviate the pain. Obtain insurance approval and coordinate the nerve block to coincide with the SI joint injection.        [name] has a current medication list which includes the following long-term medication(s): losartan , pantoprazole , ranolazine , and rosuvastatin .  Pharmacotherapy (Medications Ordered): No orders of the defined types were placed in this encounter.  Orders:  Orders Placed This Encounter  Procedures   SUPRASCAPULAR NERVE BLOCK    For shoulder pain.    Standing Status:   Future    Expected Date:   08/29/2023    Expiration Date:   11/09/2023    Scheduling Instructions:     Purpose: Diagnostic     Laterality: LEFT     Level(s): Suprascapular notch     Scheduling Timeframe: As permitted by the schedule    Where will this procedure be performed?:   [facility]   SACROILIAC JOINT INJECTION    Physical  Examination Findings: Positive Sacral Thrust (Sacral Spring, Downward Pressure): (Y) Positive FABER maneuver (Patrick's): (Y) Positive SI distraction (Gapping): (Y) Positive SI compression (Approximation): (Y) Positive Thigh Thrust:  (Y) Positive Gaenslen's: (Y) Positive Sacral Sulcus Tenderness: (Y)    Standing Status:   Future    Expiration Date:   11/09/2023    Scheduling Instructions:     Procedure: Sacroiliac Joint Injection & and Left Piriformis     Side  Laterality: LEFT     Sedation: Patient's choice.     Timeframe: As soon as schedule allows.    Where will this procedure be performed?:   [facility]   Follow-up plan:   Return in about 20 days (around 08/29/2023) for Left SIJ+ Piriformis, left SSNB , in clinic [facility].    Recent Visits No visits were found meeting these conditions. Showing recent visits within past 90 days and meeting all other requirements Today's Visits Date Type Provider Dept  08/09/23 Office Visit Cephus Collin, MD [facility]  Showing today's visits and meeting all other requirements Future Appointments Date Type Provider Dept  08/29/23 Appointment Cephus Collin, MD [facility]  Showing future appointments within next 90 days and meeting all other requirements   I discussed the assessment and treatment plan with the patient. The patient was provided an opportunity to ask questions and all were answered. The patient agreed with the plan and demonstrated an understanding of the instructions.  Patient advised to call back or seek an in-person evaluation if the symptoms or condition worsens.  Duration of encounter: .  Total time on encounter, as per AMA guidelines included both the face-to-face and non-face-to-face time personally spent by the physician and/or other qualified health care professional(s) on the day of the encounter (includes time in activities that require the physician or other qualified health care  professional and does not include time in activities normally performed by  clinical staff). Physician's time may include the following activities when performed: Preparing to see the patient (e.g., pre-charting review of records, searching for previously ordered imaging, lab work, and nerve conduction tests) Review of prior analgesic pharmacotherapies. Reviewing PMP Interpreting ordered tests (e.g., lab work, imaging, nerve conduction tests) Performing post-procedure evaluations, including interpretation of diagnostic procedures Obtaining and/or reviewing separately obtained history Performing a medically appropriate examination and/or evaluation Counseling and educating the patient/family/caregiver Ordering medications, tests, or procedures Referring and communicating with other health care professionals (when not separately reported) Documenting clinical information in the electronic or other health record Independently interpreting results (not separately reported) and communicating results to the patient/ family/caregiver Care coordination (not separately reported)  Note by: Cephus Collin, MD (TTS and AI technology used. I apologize for any typographical errors that were not detected and corrected.) Date: 08/09/2023; Time: 3:45 PM

## 2023-08-09 NOTE — Patient Instructions (Signed)
____________________________________________________________________________________________  General Risks and Possible Complications  Patient Responsibilities: It is important that you read this as it is part of your informed consent. It is our duty to inform you of the risks and possible complications associated with treatments offered to you. It is your responsibility as a patient to read this and to ask questions about anything that is not clear or that you believe was not covered in this document.  Patient's Rights: You have the right to refuse treatment. You also have the right to change your mind, even after initially having agreed to have the treatment done. However, under this last option, if you wait until the last second to change your mind, you may be charged for the materials used up to that point.  Introduction: Medicine is not an exact science. Everything in Medicine, including the lack of treatment(s), carries the potential for danger, harm, or loss (which is by definition: Risk). In Medicine, a complication is a secondary problem, condition, or disease that can aggravate an already existing one. All treatments carry the risk of possible complications. The fact that a side effects or complications occurs, does not imply that the treatment was conducted incorrectly. It must be clearly understood that these can happen even when everything is done following the highest safety standards.  No treatment: You can choose not to proceed with the proposed treatment alternative. The "PRO(s)" would include: avoiding the risk of complications associated with the therapy. The "CON(s)" would include: not getting any of the treatment benefits. These benefits fall under one of three categories: diagnostic; therapeutic; and/or palliative. Diagnostic benefits include: getting information which can ultimately lead to improvement of the disease or symptom(s). Therapeutic benefits are those associated with the  successful treatment of the disease. Finally, palliative benefits are those related to the decrease of the primary symptoms, without necessarily curing the condition (example: decreasing the pain from a flare-up of a chronic condition, such as incurable terminal cancer).  General Risks and Complications: These are associated to most interventional treatments. They can occur alone, or in combination. They fall under one of the following six (6) categories: no benefit or worsening of symptoms; bleeding; infection; nerve damage; allergic reactions; and/or death. No benefits or worsening of symptoms: In Medicine there are no guarantees, only probabilities. No healthcare provider can ever guarantee that a medical treatment will work, they can only state the probability that it may. Furthermore, there is always the possibility that the condition may worsen, either directly, or indirectly, as a consequence of the treatment. Bleeding: This is more common if the patient is taking a blood thinner, either prescription or over the counter (example: Goody Powders, Fish oil, Aspirin, Garlic, etc.), or if suffering a condition associated with impaired coagulation (example: Hemophilia, cirrhosis of the liver, low platelet counts, etc.). However, even if you do not have one on these, it can still happen. If you have any of these conditions, or take one of these drugs, make sure to notify your treating physician. Infection: This is more common in patients with a compromised immune system, either due to disease (example: diabetes, cancer, human immunodeficiency virus [HIV], etc.), or due to medications or treatments (example: therapies used to treat cancer and rheumatological diseases). However, even if you do not have one on these, it can still happen. If you have any of these conditions, or take one of these drugs, make sure to notify your treating physician. Nerve Damage: This is more common when the treatment is an invasive    one, but it can also happen with the use of medications, such as those used in the treatment of cancer. The damage can occur to small secondary nerves, or to large primary ones, such as those in the spinal cord and brain. This damage may be temporary or permanent and it may lead to impairments that can range from temporary numbness to permanent paralysis and/or brain death. Allergic Reactions: Any time a substance or material comes in contact with our body, there is the possibility of an allergic reaction. These can range from a mild skin rash (contact dermatitis) to a severe systemic reaction (anaphylactic reaction), which can result in death. Death: In general, any medical intervention can result in death, most of the time due to an unforeseen complication. ____________________________________________________________________________________________ Sacroiliac (SI) Joint Injection Patient Information  Description: The sacroiliac joint connects the scrum (very low back and tailbone) to the ilium (a pelvic bone which also forms half of the hip joint).  Normally this joint experiences very little motion.  When this joint becomes inflamed or unstable low back and or hip and pelvis pain may result.  Injection of this joint with local anesthetics (numbing medicines) and steroids can provide diagnostic information and reduce pain.  This injection is performed with the aid of x-ray guidance into the tailbone area while you are lying on your stomach.   You may experience an electrical sensation down the leg while this is being done.  You may also experience numbness.  We also may ask if we are reproducing your normal pain during the injection.  Conditions which may be treated SI injection:  Low back, buttock, hip or leg pain  Preparation for the Injection:  Do not eat any solid food or dairy products within 8 hours of your appointment.  You may drink clear liquids up to 3 hours before appointment.  Clear  liquids include water, black coffee, juice or soda.  No milk or cream please. You may take your regular medications, including pain medications with a sip of water before your appointment.  Diabetics should hold regular insulin (if take separately) and take 1/2 normal NPH dose the morning of the procedure.  Carry some sugar containing items with you to your appointment. A driver must accompany you and be prepared to drive you home after your procedure. Bring all of your current medications with you. An IV may be inserted and sedation may be given at the discretion of the physician. A blood pressure cuff, EKG and other monitors will often be applied during the procedure.  Some patients may need to have extra oxygen administered for a short period.  You will be asked to provide medical information, including your allergies, prior to the procedure.  We must know immediately if you are taking blood thinners (like Coumadin/Warfarin) or if you are allergic to IV iodine contrast (dye).  We must know if you could possible be pregnant.  Possible side effects:  Bleeding from needle site Infection (rare, may require surgery) Nerve injury (rare) Numbness & tingling (temporary) A brief convulsion or seizure Light-headedness (temporary) Pain at injection site (several days) Decreased blood pressure (temporary) Weakness in the leg (temporary)   Call if you experience:  New onset weakness or numbness of an extremity below the injection site that last more than 8 hours. Hives or difficulty breathing ( go to the emergency room) Inflammation or drainage at the injection site Any new symptoms which are concerning to you  Please note:  Although the local anesthetic   injected can often make your back/ hip/ buttock/ leg feel good for several hours after the injections, the pain will likely return.  It takes 3-7 days for steroids to work in the sacroiliac area.  You may not notice any pain relief for at least  that one week.  If effective, we will often do a series of three injections spaced 3-6 weeks apart to maximally decrease your pain.  After the initial series, we generally will wait some months before a repeat injection of the same type.  If you have any questions, please call [phone] [facility]   

## 2023-08-09 NOTE — Progress Notes (Signed)
 Safety precautions to be maintained throughout the outpatient stay will include: orient to surroundings, keep bed in low position, maintain call bell within reach at all times, provide assistance with transfer out of bed and ambulation.

## 2023-08-21 ENCOUNTER — Other Ambulatory Visit: Payer: Self-pay | Admitting: Internal Medicine

## 2023-08-21 DIAGNOSIS — E782 Mixed hyperlipidemia: Secondary | ICD-10-CM

## 2023-08-27 ENCOUNTER — Other Ambulatory Visit: Payer: Self-pay | Admitting: Cardiovascular Disease

## 2023-08-28 ENCOUNTER — Other Ambulatory Visit: Payer: Self-pay | Admitting: Cardiology

## 2023-08-28 ENCOUNTER — Other Ambulatory Visit: Payer: Self-pay | Admitting: Internal Medicine

## 2023-08-28 DIAGNOSIS — E119 Type 2 diabetes mellitus without complications: Secondary | ICD-10-CM

## 2023-08-29 ENCOUNTER — Ambulatory Visit
Attending: Student in an Organized Health Care Education/Training Program | Admitting: Student in an Organized Health Care Education/Training Program

## 2023-08-29 ENCOUNTER — Encounter: Payer: Self-pay | Admitting: Student in an Organized Health Care Education/Training Program

## 2023-08-29 ENCOUNTER — Ambulatory Visit
Admission: RE | Admit: 2023-08-29 | Discharge: 2023-08-29 | Disposition: A | Discharge status: Home / Self Care | Source: Ambulatory Visit | Attending: Student in an Organized Health Care Education/Training Program | Admitting: Student in an Organized Health Care Education/Training Program

## 2023-08-29 VITALS — BP 161/85 | HR 74 | Temp 98.2°F | Resp 20 | Ht 68.0 in | Wt 190.0 lb

## 2023-08-29 DIAGNOSIS — M12812 Other specific arthropathies, not elsewhere classified, left shoulder: Secondary | ICD-10-CM

## 2023-08-29 DIAGNOSIS — M25552 Pain in left hip: Secondary | ICD-10-CM | POA: Diagnosis not present

## 2023-08-29 DIAGNOSIS — G8929 Other chronic pain: Secondary | ICD-10-CM | POA: Insufficient documentation

## 2023-08-29 DIAGNOSIS — M25512 Pain in left shoulder: Secondary | ICD-10-CM

## 2023-08-29 DIAGNOSIS — M533 Sacrococcygeal disorders, not elsewhere classified: Secondary | ICD-10-CM | POA: Diagnosis not present

## 2023-08-29 DIAGNOSIS — M75102 Unspecified rotator cuff tear or rupture of left shoulder, not specified as traumatic: Secondary | ICD-10-CM | POA: Diagnosis not present

## 2023-08-29 DIAGNOSIS — G5702 Lesion of sciatic nerve, left lower limb: Secondary | ICD-10-CM | POA: Insufficient documentation

## 2023-08-29 DIAGNOSIS — M461 Sacroiliitis, not elsewhere classified: Secondary | ICD-10-CM

## 2023-08-29 MED ORDER — LIDOCAINE HCL 2 % IJ SOLN
20.0000 mL | Freq: Once | INTRAMUSCULAR | Status: AC
  Administered 2023-08-29: 400 mg
  Filled 2023-08-29: qty 20

## 2023-08-29 MED ORDER — ROPIVACAINE HCL 2 MG/ML IJ SOLN
18.0000 mL | Freq: Once | INTRAMUSCULAR | Status: AC
  Administered 2023-08-29: 18 mL via PERINEURAL
  Filled 2023-08-29: qty 20

## 2023-08-29 MED ORDER — IOHEXOL 180 MG/ML  SOLN
10.0000 mL | Freq: Once | INTRAMUSCULAR | Status: AC
  Administered 2023-08-29: 10 mL via INTRA_ARTICULAR
  Filled 2023-08-29: qty 20

## 2023-08-29 MED ORDER — DEXAMETHASONE SODIUM PHOSPHATE 10 MG/ML IJ SOLN
10.0000 mg | Freq: Once | INTRAMUSCULAR | Status: AC
  Administered 2023-08-29: 10 mg
  Filled 2023-08-29: qty 1

## 2023-08-29 MED ORDER — METHYLPREDNISOLONE ACETATE 80 MG/ML IJ SUSP
80.0000 mg | Freq: Once | INTRAMUSCULAR | Status: AC
  Administered 2023-08-29: 80 mg
  Filled 2023-08-29: qty 1

## 2023-08-29 NOTE — Patient Instructions (Signed)

## 2023-08-29 NOTE — Progress Notes (Signed)
 Safety precautions to be maintained throughout the outpatient stay will include: orient to surroundings, keep bed in low position, maintain call bell within reach at all times, provide assistance with transfer out of bed and ambulation.

## 2023-08-29 NOTE — Progress Notes (Signed)
 PROVIDER NOTE: Information contained herein reflects review and annotations entered in association with encounter. Interpretation of such information and data should be left to medically-trained personnel. Information provided to patient can be located elsewhere in the medical record under "Patient Instructions". Document created using STT-dictation technology, any transcriptional errors that may result from process are unintentional.    Patient: Eric Guerrero  Service Category: Procedure  Provider: Cephus Collin, MD  DOB: 10/24/1950  DOS: 08/29/2023  Location: [facility]  MRN: [id number]  Setting: Ambulatory - outpatient  Referring Provider: Shari Daughters, MD  Type: Established Patient  Specialty: Interventional Pain Management  PCP: Shari Daughters, MD   Primary Reason for Visit: Interventional Pain Management Treatment. CC: Shoulder Pain (left) and Hip Pain (left)  Procedure:          Anesthesia, Analgesia, Anxiolysis:  Type: Therapeutic Sacroiliac Joint Steroid Injection #2 and left piriformis TPI #2 Region: Inferior Lumbosacral Region Level: PIIS (Posterior Inferior Iliac Spine) Laterality: Left-Side  Type: Local Anesthesia  Local Anesthetic: Lidocaine  1-2%  Position: Prone           Indications: 1. SI joint arthritis (left > right)   2. Sacroiliac joint pain   3. Piriformis syndrome (Left)   4. Rotator cuff tear arthropathy (Left)   5. Chronic left shoulder pain    Pain Score: Pre-procedure: 3/10 Post-procedure: 0-No pain/10   Pre-op H&P Assessment:  Eric Guerrero is a 73 y.o. (year old), male patient, seen today for interventional treatment. He  has a past surgical history that includes LEFT HEART CATH AND CORONARY ANGIOGRAPHY (Left, 09/11/2020). Eric Guerrero has a current medication list which includes the following prescription(s): onetouch verio, losartan , ranolazine , rosuvastatin , pantoprazole , and synjardy . His primarily concern today is the Shoulder Pain  (left) and Hip Pain (left)  Initial Vital Signs:  Pulse/HCG Rate: 74ECG Heart Rate: 78 Temp: 98.2 F (36.8 C) Resp: 16 BP: (!) 147/85 SpO2: 100 %  BMI: Estimated body mass index is 28.89 kg/m as calculated from the following:   Height as of this encounter: 5\' 8"  (1.727 m).   Weight as of this encounter: 190 lb (86.2 kg).  Risk Assessment: Allergies: Reviewed. He has no known allergies.  Allergy Precautions: None required Coagulopathies: Reviewed. None identified.  Blood-thinner therapy: None at this time Active Infection(s): Reviewed. None identified. Eric Guerrero is afebrile  Site Confirmation: Eric Guerrero was asked to confirm the procedure and laterality before marking the site Procedure checklist: Completed Consent: Before the procedure and under the influence of no sedative(s), amnesic(s), or anxiolytics, the patient was informed of the treatment options, risks and possible complications. To fulfill our ethical and legal obligations, as recommended by the American Medical Association's Code of Ethics, I have informed the patient of my clinical impression; the nature and purpose of the treatment or procedure; the risks, benefits, and possible complications of the intervention; the alternatives, including doing nothing; the risk(s) and benefit(s) of the alternative treatment(s) or procedure(s); and the risk(s) and benefit(s) of doing nothing. The patient was provided information about the general risks and possible complications associated with the procedure. These may include, but are not limited to: failure to achieve desired goals, infection, bleeding, organ or nerve damage, allergic reactions, paralysis, and death. In addition, the patient was informed of those risks and complications associated to the procedure, such as failure to decrease pain; infection; bleeding; organ or nerve damage with subsequent damage to sensory, motor, and/or autonomic systems, resulting in permanent pain,  numbness, and/or  weakness of one or several areas of the body; allergic reactions; (i.e.: anaphylactic reaction); and/or death. Furthermore, the patient was informed of those risks and complications associated with the medications. These include, but are not limited to: allergic reactions (i.e.: anaphylactic or anaphylactoid reaction(s)); adrenal axis suppression; blood sugar elevation that in diabetics may result in ketoacidosis or comma; water retention that in patients with history of congestive heart failure may result in shortness of breath, pulmonary edema, and decompensation with resultant heart failure; weight gain; swelling or edema; medication-induced neural toxicity; particulate matter embolism and blood vessel occlusion with resultant organ, and/or nervous system infarction; and/or aseptic necrosis of one or more joints. Finally, the patient was informed that Medicine is not an exact science; therefore, there is also the possibility of unforeseen or unpredictable risks and/or possible complications that may result in a catastrophic outcome. The patient indicated having understood very clearly. We have given the patient no guarantees and we have made no promises. Enough time was given to the patient to ask questions, all of which were answered to the patient's satisfaction. Eric Guerrero has indicated that he wanted to continue with the procedure. Attestation: I, the ordering provider, attest that I have discussed with the patient the benefits, risks, side-effects, alternatives, likelihood of achieving goals, and potential problems during recovery for the procedure that I have provided informed consent. Date  Time: 08/29/2023 11:30 AM  Pre-Procedure Preparation:  Monitoring: As per clinic protocol. Respiration, ETCO2, SpO2, BP, heart rate and rhythm monitor placed and checked for adequate function Safety Precautions: Patient was assessed for positional comfort and pressure points before starting the  procedure. Time-out: I initiated and conducted the "Time-out" before starting the procedure, as per protocol. The patient was asked to participate by confirming the accuracy of the "Time Out" information. Verification of the correct person, site, and procedure were performed and confirmed by me, the nursing staff, and the patient. "Time-out" conducted as per Joint Commission's Universal Protocol (UP.01.01.01). Time: 1225  Description of Procedure:          Target Area: Inferior, posterior, aspect of the sacroiliac fissure Approach: Posterior, paraspinal, ipsilateral approach. Area Prepped: Entire Lower Lumbosacral Region DuraPrep (Iodine Povacrylex [0.7% available iodine] and Isopropyl Alcohol, 74% w/w) Safety Precautions: Aspiration looking for blood return was conducted prior to all injections. At no point did we inject any substances, as a needle was being advanced. No attempts were made at seeking any paresthesias. Safe injection practices and needle disposal techniques used. Medications properly checked for expiration dates. SDV (single dose vial) medications used. Description of the Procedure: Protocol guidelines were followed. The patient was placed in position over the procedure table. The target area was identified and the area prepped in the usual manner. Skin & deeper tissues infiltrated with local anesthetic. Appropriate amount of time allowed to pass for local anesthetics to take effect. The procedure needle was advanced under fluoroscopic guidance into the sacroiliac joint until a firm endpoint was obtained. Proper needle placement secured. Negative aspiration confirmed. Solution injected in intermittent fashion, asking for systemic symptoms every 0.5cc of injectate. The needles were then removed and the area cleansed, making sure to leave some of the prepping solution back to take advantage of its long term bactericidal properties. Vitals:   08/29/23 1220 08/29/23 1225 08/29/23 1230  08/29/23 1235  BP: (!) 169/108 (!) 174/106 (!) 173/105 (!) 161/85  Pulse:      Resp: 20 (!) 24 (!) 25 20  Temp:  SpO2: 100% 100% 100% 100%  Weight:      Height:        Start Time: 1225 hrs. End Time: 1231 hrs. Materials:  Needle(s) Type: Spinal Needle Gauge: 25G Length: 3.5-in Medication(s): Please see orders for medications and dosing details. 10 cc solution made of 9 cc of 0.2% ropivacaine , 1 cc of methylprednisolone  80 mg/cc.  Injected into the left sacroiliac joint after contrast confirmation.  1 cm lateral, 1 cm deep, 1 cm inferior to the inferior fissure of the left sacroiliac joint, left piriformis trigger point injection was performed under fluoroscopy with contrast confirmation to visualize striated piriformis muscle.  5 cc of above nerve block solution injected into the left piriformis muscle under live fluoroscopy and after contrast confirmation.  Patient denied any pain radiating to his left leg during injection.  Imaging Guidance (Non-Spinal):          Type of Imaging Technique: Fluoroscopy Guidance (Non-Spinal) Indication(s): Assistance in needle guidance and placement for procedures requiring needle placement in or near specific anatomical locations not easily accessible without such assistance. Exposure Time: Please see nurses notes. Contrast: Before injecting any contrast, we confirmed that the patient did not have an allergy to iodine, shellfish, or radiological contrast. Once satisfactory needle placement was completed at the desired level, radiological contrast was injected. Contrast injected under live fluoroscopy. No contrast complications. See chart for type and volume of contrast used. Fluoroscopic Guidance: I was personally present during the use of fluoroscopy. "Tunnel Vision Technique" used to obtain the best possible view of the target area. Parallax error corrected before commencing the procedure. "Direction-depth-direction" technique used to introduce the  needle under continuous pulsed fluoroscopy. Once target was reached, antero-posterior, oblique, and lateral fluoroscopic projection used confirm needle placement in all planes. Images permanently stored in EMR. Interpretation: I personally interpreted the imaging intraoperatively. Adequate needle placement confirmed in multiple planes. Appropriate spread of contrast into desired area was observed. No evidence of afferent or efferent intravascular uptake. Permanent images saved into the patient's record.  Post-operative Assessment:  Post-procedure Vital Signs:  Pulse/HCG Rate: 7480 Temp: 98.2 F (36.8 C) Resp: 20 BP: (!) 161/85 SpO2: 100 %  EBL: None  Complications: No immediate post-treatment complications observed by team, or reported by patient.  Note: The patient tolerated the entire procedure well. A repeat set of vitals were taken after the procedure and the patient was kept under observation following institutional policy, for this type of procedure. Post-procedural neurological assessment was performed, showing return to baseline, prior to discharge. The patient was provided with post-procedure discharge instructions, including a section on how to identify potential problems. Should any problems arise concerning this procedure, the patient was given instructions to immediately contact us , at any time, without hesitation. In any case, we plan to contact the patient by telephone for a follow-up status report regarding this interventional procedure.  Comments:  No additional relevant information.  Plan of Care  Orders:  Orders Placed This Encounter  Procedures   DG PAIN CLINIC C-ARM 1-60 MIN NO REPORT    Intraoperative interpretation by procedural physician at [facility].    Standing Status:   Standing    Number of Occurrences:   1    Reason for exam::   Assistance in needle guidance and placement for procedures requiring needle placement in or near specific anatomical  locations not easily accessible without such assistance.    Medications ordered for procedure: Meds ordered this encounter  Medications   iohexol  (OMNIPAQUE )  180 MG/ML injection 10 mL    Must be Myelogram-compatible. If not available, you may substitute with a water-soluble, non-ionic, hypoallergenic, myelogram-compatible radiological contrast medium.   lidocaine  (XYLOCAINE ) 2 % (with pres) injection 400 mg   ropivacaine  (PF) 2 mg/mL (0.2%) (NAROPIN ) injection 18 mL   methylPREDNISolone  acetate (DEPO-MEDROL ) injection 80 mg   dexamethasone  (DECADRON ) injection 10 mg   Medications administered: We administered iohexol , lidocaine , ropivacaine  (PF) 2 mg/mL (0.2%), methylPREDNISolone  acetate, and dexamethasone .  See the medical record for exact dosing, route, and time of administration.  Follow-up plan:   Return in about 5 weeks (around 10/03/2023) for PPE VV.    Recent Visits Date Type Provider Dept  08/09/23 Office Visit Cephus Collin, MD [facility]  Showing recent visits within past 90 days and meeting all other requirements Today's Visits Date Type Provider Dept  08/29/23 Procedure visit Cephus Collin, MD [facility]  Showing today's visits and meeting all other requirements Future Appointments Date Type Provider Dept  10/03/23 Appointment Cephus Collin, MD [facility]  Showing future appointments within next 90 days and meeting all other requirements  Disposition: Discharge home  Discharge (Date  Time): 08/29/2023; 1244 hrs.   Primary Care Physician: Shari Daughters, MD Location: [facility] Note by: Cephus Collin, MD Date: 08/29/2023; Time: 1:44 PM  Disclaimer:  Medicine is not an Visual merchandiser. The only guarantee in medicine is that nothing is guaranteed. It is important to note that the decision to proceed with this intervention was based on the information collected from the patient. The Data and conclusions were  drawn from the patient's questionnaire, the interview, and the physical examination. Because the information was provided in large part by the patient, it cannot be guaranteed that it has not been purposely or unconsciously manipulated. Every effort has been made to obtain as much relevant data as possible for this evaluation. It is important to note that the conclusions that lead to this procedure are derived in large part from the available data. Always take into account that the treatment will also be dependent on availability of resources and existing treatment guidelines, considered by other Pain Management Practitioners as being common knowledge and practice, at the time of the intervention. For Medico-Legal purposes, it is also important to point out that variation in procedural techniques and pharmacological choices are the acceptable norm. The indications, contraindications, technique, and results of the above procedure should only be interpreted and judged by a Board-Certified Interventional Pain Specialist with extensive familiarity and expertise in the same exact procedure and technique.

## 2023-08-29 NOTE — Progress Notes (Signed)
 PROVIDER NOTE: Interpretation of information contained herein should be left to medically-trained personnel. Specific patient instructions are provided elsewhere under "Patient Instructions" section of medical record. This document was created in part using STT-dictation technology, any transcriptional errors that may result from this process are unintentional.  Patient: Eric Guerrero Type: Established DOB: [birth year] MRN: [id number] PCP: Shari Daughters, MD  Service: Procedure DOS: 08/29/2023 Setting: Ambulatory Location: Ambulatory outpatient facility Delivery: Face-to-face Provider: Cephus Collin, MD Specialty: Interventional Pain Management Specialty designation: 09 Location: Outpatient facility Ref. Prov.: Shari Daughters, MD       Interventional Therapy   Procedure: Suprascapular nerve block (SSNB) #1  Laterality:  Left  Level: Superior to scapular spine, lateral to supraspinatus fossa (Suprascapular notch).  Imaging: Fluoroscopic guidance         Anesthesia: Local anesthesia (1-2% Lidocaine ) Sedation: No Sedation                       DOS: 08/29/2023  Performed by: Cephus Collin, MD  Purpose: Diagnostic/Therapeutic Indications: Shoulder pain, severe enough to impact quality of life and/or function. Left shoulder pain  NAS-11 score:   Pre-procedure: 4/10   Post-procedure: 0-No pain/10     Target: Suprascapular nerve Location: midway between the medial border of the scapula and the acromion as it runs through the suprascapular notch. Region: Suprascapular, posterior shoulder  Approach: Percutaneous  Neuroanatomy: The suprascapular nerve is the lateral branch of the superior trunk of the brachial plexus. It receives nerve fibers that originate in the nerve roots C5 and C6 (and sometimes C4). It is a mixed nerve, meaning that it provides both sensory and motor supply for the suprascapular region. Function: The main function of this nerve is to provide motor innervation for  two muscles, the supraspinatus and infraspinatus muscles. They are part of the rotator cuff muscles. In addition, the suprascapular nerve provides a sensory supply to the joints of the scapula (glenohumeral and acromioclavicular joints). Rationale (medical necessity): procedure needed and proper for the diagnosis and/or treatment of the patient's medical symptoms and needs.  Position / Prep / Materials:  Position: Prone Materials:  Tray: Block Needle(s):  Type: Spinal  Gauge (G): 22  Length: 3.5 in.  Qty: 1 Prep solution: ChloraPrep (2% chlorhexidine gluconate and 70% isopropyl alcohol) Prep Area: Entire posterior shoulder area. From upper spine to shoulder proper (upper arm), and from lateral neck to lower tip of shoulder blade.   H&P (Pre-op Assessment):  [name] is a 73 y.o. (year old), male patient, seen today for interventional treatment. He  has a past surgical history that includes LEFT HEART CATH AND CORONARY ANGIOGRAPHY (Left, 09/11/2020). [name] has a current medication list which includes the following prescription(s): onetouch verio, losartan , ranolazine , rosuvastatin , pantoprazole , and synjardy . His primarily concern today is the Shoulder Pain (left) and Hip Pain (left)  Initial Vital Signs:  Pulse/HCG Rate: 74ECG Heart Rate: 78 Temp: 98.2 F (36.8 C) Resp: 16 BP: (!) 147/85 SpO2: 100 %  BMI: Estimated body mass index is 28.89 kg/m as calculated from the following:   Height as of this encounter: 5\' 8"  (1.727 m).   Weight as of this encounter: 190 lb (86.2 kg).  Risk Assessment: Allergies: Reviewed. He has no known allergies.  Allergy Precautions: None required Coagulopathies: Reviewed. None identified.  Blood-thinner therapy: None at this time Active Infection(s): Reviewed. None identified. [name] is afebrile  Site Confirmation: [name] was asked to confirm the procedure and laterality before marking  the site Procedure checklist: Completed Consent: Before the  procedure and under the influence of no sedative(s), amnesic(s), or anxiolytics, the patient was informed of the treatment options, risks and possible complications. To fulfill our ethical and legal obligations, as recommended by the American Medical Association's Code of Ethics, I have informed the patient of my clinical impression; the nature and purpose of the treatment or procedure; the risks, benefits, and possible complications of the intervention; the alternatives, including doing nothing; the risk(s) and benefit(s) of the alternative treatment(s) or procedure(s); and the risk(s) and benefit(s) of doing nothing. The patient was provided information about the general risks and possible complications associated with the procedure. These may include, but are not limited to: failure to achieve desired goals, infection, bleeding, organ or nerve damage, allergic reactions, paralysis, and death. In addition, the patient was informed of those risks and complications associated to the procedure, such as failure to decrease pain; infection; bleeding; organ or nerve damage with subsequent damage to sensory, motor, and/or autonomic systems, resulting in permanent pain, numbness, and/or weakness of one or several areas of the body; allergic reactions; (i.e.: anaphylactic reaction); and/or death. Furthermore, the patient was informed of those risks and complications associated with the medications. These include, but are not limited to: allergic reactions (i.e.: anaphylactic or anaphylactoid reaction(s)); adrenal axis suppression; blood sugar elevation that in diabetics may result in ketoacidosis or comma; water retention that in patients with history of congestive heart failure may result in shortness of breath, pulmonary edema, and decompensation with resultant heart failure; weight gain; swelling or edema; medication-induced neural toxicity; particulate matter embolism and blood vessel occlusion with resultant organ,  and/or nervous system infarction; and/or aseptic necrosis of one or more joints. Finally, the patient was informed that Medicine is not an exact science; therefore, there is also the possibility of unforeseen or unpredictable risks and/or possible complications that may result in a catastrophic outcome. The patient indicated having understood very clearly. We have given the patient no guarantees and we have made no promises. Enough time was given to the patient to ask questions, all of which were answered to the patient's satisfaction. Mr. Wakefield has indicated that he wanted to continue with the procedure. Attestation: I, the ordering provider, attest that I have discussed with the patient the benefits, risks, side-effects, alternatives, likelihood of achieving goals, and potential problems during recovery for the procedure that I have provided informed consent. Date  Time: 08/29/2023 11:30 AM  Pre-Procedure Preparation:  Monitoring: As per clinic protocol. Respiration, ETCO2, SpO2, BP, heart rate and rhythm monitor placed and checked for adequate function Safety Precautions: Patient was assessed for positional comfort and pressure points before starting the procedure. Time-out: I initiated and conducted the "Time-out" before starting the procedure, as per protocol. The patient was asked to participate by confirming the accuracy of the "Time Out" information. Verification of the correct person, site, and procedure were performed and confirmed by me, the nursing staff, and the patient. "Time-out" conducted as per Joint Commission's Universal Protocol (UP.01.01.01). Time: 1225 Start Time: 1225 hrs.  Description of Procedure:          Procedural Technique Safety Precautions: Aspiration looking for blood return was conducted prior to all injections. At no point did we inject any substances, as a needle was being advanced. No attempts were made at seeking any paresthesias. Safe injection practices and needle  disposal techniques used. Medications properly checked for expiration dates. SDV (single dose vial) medications used. Description of the Procedure:  Protocol guidelines were followed. The patient was placed in position over the procedure table. The target area was identified and the area prepped in the usual manner. Skin & deeper tissues infiltrated with local anesthetic. Appropriate amount of time allowed to pass for local anesthetics to take effect. The procedure needles were then advanced to the target area. Proper needle placement secured. Negative aspiration confirmed. Solution injected in intermittent fashion, asking for systemic symptoms every 0.5cc of injectate. The needles were then removed and the area cleansed, making sure to leave some of the prepping solution back to take advantage of its long term bactericidal properties. 5 cc solution made of 4 cc of 0.2% ropivacaine , 1 cc of Decadron  10 mg/cc.  Injected along the left suprascapular notch after contrast confirmation   Vitals:   08/29/23 1220 08/29/23 1225 08/29/23 1230 08/29/23 1235  BP: (!) 169/108 (!) 174/106 (!) 173/105 (!) 161/85  Pulse:      Resp: 20 (!) 24 (!) 25 20  Temp:      SpO2: 100% 100% 100% 100%  Weight:      Height:         Start Time: 1225 hrs. End Time: 1231 hrs.  Imaging Guidance (Spinal):         Type of Imaging Technique: Fluoroscopy Guidance (non Spinal) Indication(s): Fluoroscopy guidance for needle placement to enhance accuracy in procedures requiring precise needle localization for targeted delivery of medication in or near specific anatomical locations not easily accessible without such real-time imaging assistance. Exposure Time: Please see nurses notes. Contrast: Before injecting any contrast, we confirmed that the patient did not have an allergy to iodine, shellfish, or radiological contrast. Once satisfactory needle placement was completed at the desired level, radiological contrast was injected.  Contrast injected under live fluoroscopy. No contrast complications. See chart for type and volume of contrast used. Fluoroscopic Guidance: I was personally present during the use of fluoroscopy. "Tunnel Vision Technique" used to obtain the best possible view of the target area. Parallax error corrected before commencing the procedure. "Direction-depth-direction" technique used to introduce the needle under continuous pulsed fluoroscopy. Once target was reached, antero-posterior, oblique, and lateral fluoroscopic projection used confirm needle placement in all planes. Images permanently stored in EMR. Interpretation: I personally interpreted the imaging intraoperatively. Adequate needle placement confirmed in multiple planes. Appropriate spread of contrast into desired area was observed. No evidence of afferent or efferent intravascular uptake. No intrathecal or subarachnoid spread observed. Permanent images saved into the patient's record.  Antibiotic Prophylaxis:   Anti-infectives (From admission, onward)    None      Indication(s): None identified   Post-operative Assessment:  Post-procedure Vital Signs:  Pulse/HCG Rate: 7480 Temp: 98.2 F (36.8 C) Resp: 20 BP: (!) 161/85 SpO2: 100 %  EBL: None  Complications: No immediate post-treatment complications observed by team, or reported by patient.  Note: The patient tolerated the entire procedure well. A repeat set of vitals were taken after the procedure and the patient was kept under observation following institutional policy, for this type of procedure. Post-procedural neurological assessment was performed, showing return to baseline, prior to discharge. The patient was provided with post-procedure discharge instructions, including a section on how to identify potential problems. Should any problems arise concerning this procedure, the patient was given instructions to immediately contact us , at any time, without hesitation. In any case,  we plan to contact the patient by telephone for a follow-up status report regarding this interventional procedure.  Comments:  No additional relevant  information.  Plan of Care (POC)  Orders:  Orders Placed This Encounter  Procedures   DG PAIN CLINIC C-ARM 1-60 MIN NO REPORT    Intraoperative interpretation by procedural physician at [facility].    Standing Status:   Standing    Number of Occurrences:   1    Reason for exam::   Assistance in needle guidance and placement for procedures requiring needle placement in or near specific anatomical locations not easily accessible without such assistance.    Medications ordered for procedure: Meds ordered this encounter  Medications   iohexol  (OMNIPAQUE ) 180 MG/ML injection 10 mL    Must be Myelogram-compatible. If not available, you may substitute with a water-soluble, non-ionic, hypoallergenic, myelogram-compatible radiological contrast medium.   lidocaine  (XYLOCAINE ) 2 % (with pres) injection 400 mg   ropivacaine  (PF) 2 mg/mL (0.2%) (NAROPIN ) injection 18 mL   methylPREDNISolone  acetate (DEPO-MEDROL ) injection 80 mg   dexamethasone  (DECADRON ) injection 10 mg   Medications administered: We administered iohexol , lidocaine , ropivacaine  (PF) 2 mg/mL (0.2%), methylPREDNISolone  acetate, and dexamethasone .  See the medical record for exact dosing, route, and time of administration.   Follow-up plan:   Return in about 5 weeks (around 10/03/2023) for PPE VV.     Recent Visits Date Type Provider Dept  08/09/23 Office Visit Cephus Collin, MD [facility]  Showing recent visits within past 90 days and meeting all other requirements Today's Visits Date Type Provider Dept  08/29/23 Procedure visit Cephus Collin, MD [facility]  Showing today's visits and meeting all other requirements Future Appointments Date Type Provider Dept  10/03/23 Appointment Cephus Collin, MD [facility]  Showing future  appointments within next 90 days and meeting all other requirements   Disposition: Discharge home  Discharge (Date  Time): 08/29/2023; 1244 hrs.   Primary Care Physician: Shari Daughters, MD Location: [facility] Note by: Cephus Collin, MD (TTS technology used. I apologize for any typographical errors that were not detected and corrected.) Date: 08/29/2023; Time: 1:47 PM  Disclaimer:  Medicine is not an Visual merchandiser. The only guarantee in medicine is that nothing is guaranteed. It is important to note that the decision to proceed with this intervention was based on the information collected from the patient. The Data and conclusions were drawn from the patient's questionnaire, the interview, and the physical examination. Because the information was provided in large part by the patient, it cannot be guaranteed that it has not been purposely or unconsciously manipulated. Every effort has been made to obtain as much relevant data as possible for this evaluation. It is important to note that the conclusions that lead to this procedure are derived in large part from the available data. Always take into account that the treatment will also be dependent on availability of resources and existing treatment guidelines, considered by other Pain Management Practitioners as being common knowledge and practice, at the time of the intervention. For Medico-Legal purposes, it is also important to point out that variation in procedural techniques and pharmacological choices are the acceptable norm. The indications, contraindications, technique, and results of the above procedure should only be interpreted and judged by a Board-Certified Interventional Pain Specialist with extensive familiarity and expertise in the same exact procedure and technique.

## 2023-08-30 ENCOUNTER — Telehealth: Payer: Self-pay

## 2023-08-30 NOTE — Telephone Encounter (Signed)
 Post procedure follow up.  LM

## 2023-09-02 ENCOUNTER — Ambulatory Visit: Payer: 59 | Admitting: Internal Medicine

## 2023-09-19 ENCOUNTER — Other Ambulatory Visit

## 2023-09-19 DIAGNOSIS — E7800 Pure hypercholesterolemia, unspecified: Secondary | ICD-10-CM | POA: Diagnosis not present

## 2023-09-19 DIAGNOSIS — R7301 Impaired fasting glucose: Secondary | ICD-10-CM | POA: Diagnosis not present

## 2023-09-20 ENCOUNTER — Encounter: Payer: Self-pay | Admitting: Internal Medicine

## 2023-09-20 ENCOUNTER — Ambulatory Visit: Payer: Self-pay | Admitting: Internal Medicine

## 2023-09-20 ENCOUNTER — Ambulatory Visit (INDEPENDENT_AMBULATORY_CARE_PROVIDER_SITE_OTHER): Admitting: Internal Medicine

## 2023-09-20 VITALS — BP 112/70 | HR 87 | Ht 68.0 in | Wt 192.0 lb

## 2023-09-20 DIAGNOSIS — E782 Mixed hyperlipidemia: Secondary | ICD-10-CM

## 2023-09-20 DIAGNOSIS — E119 Type 2 diabetes mellitus without complications: Secondary | ICD-10-CM | POA: Diagnosis not present

## 2023-09-20 DIAGNOSIS — Z0130 Encounter for examination of blood pressure without abnormal findings: Secondary | ICD-10-CM

## 2023-09-20 LAB — LIPID PANEL
Chol/HDL Ratio: 3.6 ratio (ref 0.0–5.0)
Cholesterol, Total: 153 mg/dL (ref 100–199)
HDL: 42 mg/dL (ref >39)
LDL Chol Calc (NIH): 87 mg/dL (ref 0–99)
Triglycerides: 133 mg/dL (ref 0–149)
VLDL Cholesterol Cal: 24 mg/dL (ref 5–40)

## 2023-09-20 LAB — COMPREHENSIVE METABOLIC PANEL WITH GFR
ALT: 19 IU/L (ref 0–44)
AST: 17 IU/L (ref 0–40)
Albumin: 4.1 g/dL (ref 3.8–4.8)
Alkaline Phosphatase: 101 IU/L (ref 44–121)
BUN/Creatinine Ratio: 11 (ref 10–24)
BUN: 14 mg/dL (ref 8–27)
Bilirubin Total: 0.5 mg/dL (ref 0.0–1.2)
CO2: 23 mmol/L (ref 20–29)
Calcium: 9.2 mg/dL (ref 8.6–10.2)
Chloride: 104 mmol/L (ref 96–106)
Creatinine, Ser: 1.3 mg/dL — ABNORMAL HIGH (ref 0.76–1.27)
Globulin, Total: 2.4 g/dL (ref 1.5–4.5)
Glucose: 106 mg/dL — ABNORMAL HIGH (ref 70–99)
Potassium: 4.9 mmol/L (ref 3.5–5.2)
Sodium: 142 mmol/L (ref 134–144)
Total Protein: 6.5 g/dL (ref 6.0–8.5)
eGFR: 58 mL/min/{1.73_m2} — ABNORMAL LOW (ref >59)

## 2023-09-20 LAB — POC CREATINE & ALBUMIN,URINE
Creatinine, POC: 50 mg/dL
Microalbumin Ur, POC: 80 mg/L

## 2023-09-20 LAB — CK: Total CK: 171 U/L (ref 41–331)

## 2023-09-20 LAB — GLUCOSE, POCT (MANUAL RESULT ENTRY): POC Glucose: 107 mg/dL — AB (ref 70–99)

## 2023-09-20 LAB — HEMOGLOBIN A1C
Est. average glucose Bld gHb Est-mCnc: 143 mg/dL
Hgb A1c MFr Bld: 6.6 % — ABNORMAL HIGH (ref 4.8–5.6)

## 2023-09-20 NOTE — Progress Notes (Signed)
 Established Patient Office Visit  Subjective:  Patient ID: Eric Guerrero, male    DOB: 11-29-1950  Age: 73 y.o. MRN: [id number]  Chief Complaint  Patient presents with   Follow-up    Follow up    No new complaints, here for lab review and medication refills. Labs reviewed and notable for well controlled diabetes, A1c at target, lipids at target with unremarkable cmp. Denies any hypoglycemic episodes and home bg readings have been at target.     No other concerns at this time.   Past Medical History:  Diagnosis Date   Diabetes mellitus without complication (HCC)    Hyperlipidemia    Hypertension     Past Surgical History:  Procedure Laterality Date   LEFT HEART CATH AND CORONARY ANGIOGRAPHY Left 09/11/2020   Procedure: LEFT HEART CATH AND CORONARY ANGIOGRAPHY;  Surgeon: Bathe Denyse LABOR, MD;  Location: [facility] IN[facility]ASIVE [facility];  Service: Cardiovascular;  Laterality: Left;    Social History   Socioeconomic History   Marital status: Unknown    Spouse name: Not on file   Number of children: Not on file   Years of education: Not on file   Highest education level: Not on file  Occupational History   Not on file  Tobacco Use   Smoking status: Never   Smokeless tobacco: Never  Substance and Sexual Activity   Alcohol use: Never   Drug use: Never   Sexual activity: Not on file  Other Topics Concern   Not on file  Social History Narrative   Not on file   Social Drivers of Health   Financial Resource Strain: Not on file  Food Insecurity: Not on file  Transportation Needs: Not on file  Physical Activity: Not on file  Stress: Not on file  Social Connections: Not on file  Intimate Partner Violence: Not on file    No family history on file.  No Known Allergies  Outpatient Medications Prior to Visit  Medication Sig   glucose blood (ONETOUCH VERIO) test strip USE TO CHECK GLUCOSE TWICE DAILY   losartan  (COZAAR ) 25 MG tablet Take 1 tablet by mouth once daily    rosuvastatin  (CRESTOR ) 20 MG tablet TAKE 1 TABLET BY MOUTH AT BEDTIME   SYNJARDY  07-998 MG TABS Take 1 tablet by mouth twice daily   pantoprazole  (PROTONIX ) 40 MG tablet Take 1 tablet by mouth once daily (Patient not taking: Reported on 09/20/2023)   ranolazine  (RANEXA ) 500 MG 12 hr tablet Take 1 tablet by mouth twice daily (Patient not taking: Reported on 09/20/2023)   No facility-administered medications prior to visit.    Review of Systems  Constitutional: Negative.   HENT: Negative.    Eyes: Negative.   Respiratory: Negative.    Cardiovascular: Negative.   Gastrointestinal: Negative.   Genitourinary: Negative.  Negative for frequency and urgency.  Musculoskeletal:  Positive for back pain and joint pain.  Skin: Negative.   Neurological: Negative.   Endo/Heme/Allergies: Negative.   Psychiatric/Behavioral:  The patient is nervous/anxious.        Objective:   BP 112/70   Pulse 87   Ht 5' 8 (1.727 m)   Wt 192 lb (87.1 kg)   SpO2 100%   BMI 29.19 kg/m   Vitals:   09/20/23 1403  BP: 112/70  Pulse: 87  Height: 5' 8 (1.727 m)  Weight: 192 lb (87.1 kg)  SpO2: 100%  BMI (Calculated): 29.2    Physical Exam Vitals reviewed.  Constitutional:  Appearance: Normal appearance.  HENT:     Head: Normocephalic.     Left Ear: There is no impacted cerumen.     Nose: Nose normal.     Mouth/Throat:     Mouth: Mucous membranes are moist.     Pharynx: No posterior oropharyngeal erythema.   Eyes:     Extraocular Movements: Extraocular movements intact.     Pupils: Pupils are equal, round, and reactive to light.    Cardiovascular:     Rate and Rhythm: Regular rhythm.     Chest Wall: PMI is not displaced.     Pulses: Normal pulses.     Heart sounds: Normal heart sounds. No murmur heard. Pulmonary:     Effort: Pulmonary effort is normal.     Breath sounds: Normal air entry. Rales (scanty basal) present. No rhonchi.  Abdominal:     General: Abdomen is flat. Bowel sounds  are normal. There is no distension.     Palpations: Abdomen is soft. There is no hepatomegaly, splenomegaly or mass.     Tenderness: There is no abdominal tenderness.   Musculoskeletal:        General: Normal range of motion.     Cervical back: Normal range of motion and neck supple.     Right lower leg: No edema.     Left lower leg: No edema.   Skin:    General: Skin is warm and dry.   Neurological:     General: No focal deficit present.     Mental Status: He is alert and oriented to person, place, and time.     Cranial Nerves: No cranial nerve deficit.     Motor: No weakness.   Psychiatric:        Mood and Affect: Mood normal.        Behavior: Behavior normal.      Results for orders placed or performed in visit on 09/20/23  POCT Glucose (CBG)  Result Value Ref Range   POC Glucose 107 (A) 70 - 99 mg/dl    Recent Results (from the past 2160 hours)  Comprehensive metabolic panel     Status: Abnormal   Collection Time: 09/19/23 10:28 AM  Result Value Ref Range   Glucose 106 (H) 70 - 99 mg/dL   BUN 14 8 - 27 mg/dL   Creatinine, Ser 8.69 (H) 0.76 - 1.27 mg/dL   eGFR 58 (L) >40 fO/fpw/8.26   BUN/Creatinine Ratio 11 10 - 24   Sodium 142 134 - 144 mmol/L   Potassium 4.9 3.5 - 5.2 mmol/L   Chloride 104 96 - 106 mmol/L   CO2 23 20 - 29 mmol/L   Calcium  9.2 8.6 - 10.2 mg/dL   Total Protein 6.5 6.0 - 8.5 g/dL   Albumin 4.1 3.8 - 4.8 g/dL   Globulin, Total 2.4 1.5 - 4.5 g/dL   Bilirubin Total 0.5 0.0 - 1.2 mg/dL   Alkaline Phosphatase 101 44 - 121 IU/L   AST 17 0 - 40 IU/L   ALT 19 0 - 44 IU/L  Lipid panel     Status: None   Collection Time: 09/19/23 10:28 AM  Result Value Ref Range   Cholesterol, Total 153 100 - 199 mg/dL   Triglycerides 866 0 - 149 mg/dL   HDL 42 >60 mg/dL   VLDL Cholesterol Cal 24 5 - 40 mg/dL   LDL Chol Calc (NIH) 87 0 - 99 mg/dL   Chol/HDL Ratio 3.6 0.0 - 5.0 ratio  Comment:                                   T. Chol/HDL Ratio                                              Men  Women                               1/2 Avg.Risk  3.4    3.3                                   Avg.Risk  5.0    4.4                                2X Avg.Risk  9.6    7.1                                3X Avg.Risk 23.4   11.0   Hemoglobin A1c     Status: Abnormal   Collection Time: 09/19/23 10:28 AM  Result Value Ref Range   Hgb A1c MFr Bld 6.6 (H) 4.8 - 5.6 %    Comment:          Prediabetes: 5.7 - 6.4          Diabetes: >6.4          Glycemic control for adults with diabetes: <7.0    Est. average glucose Bld gHb Est-mCnc 143 mg/dL  CK     Status: None   Collection Time: 09/19/23 10:28 AM  Result Value Ref Range   Total CK 171 41 - 331 U/L  POCT Glucose (CBG)     Status: Abnormal   Collection Time: 09/20/23  2:09 PM  Result Value Ref Range   POC Glucose 107 (A) 70 - 99 mg/dl      Assessment & Plan:  As per problem list. Problem List Items Addressed This Visit       Endocrine   Controlled type 2 diabetes mellitus without complication, without long-term current use of insulin (HCC) - Primary   Relevant Orders   POCT Glucose (CBG) (Completed)   POC CREATINE & ALBUMIN,URINE    Return in about 3 months (around 12/21/2023) for fu with labs prior.   Total time spent: 20 minutes  [name], MD  09/20/2023   This document may have been prepared by [facility] Voice Recognition software and as such may include unintentional dictation errors.

## 2023-09-24 ENCOUNTER — Other Ambulatory Visit: Payer: Self-pay | Admitting: Cardiovascular Disease

## 2023-09-29 ENCOUNTER — Telehealth: Payer: Self-pay

## 2023-09-29 NOTE — Telephone Encounter (Signed)
 Attempt to contact for nursing portion of telephone visit with Dr. Marcelino on 10/03/23. No answer and LVM.

## 2023-10-03 ENCOUNTER — Encounter: Payer: Self-pay | Admitting: Student in an Organized Health Care Education/Training Program

## 2023-10-03 ENCOUNTER — Ambulatory Visit
Attending: Student in an Organized Health Care Education/Training Program | Admitting: Student in an Organized Health Care Education/Training Program

## 2023-10-03 DIAGNOSIS — M461 Sacroiliitis, not elsewhere classified: Secondary | ICD-10-CM | POA: Diagnosis not present

## 2023-10-03 DIAGNOSIS — G5702 Lesion of sciatic nerve, left lower limb: Secondary | ICD-10-CM | POA: Diagnosis not present

## 2023-10-03 DIAGNOSIS — M25512 Pain in left shoulder: Secondary | ICD-10-CM

## 2023-10-03 DIAGNOSIS — M533 Sacrococcygeal disorders, not elsewhere classified: Secondary | ICD-10-CM

## 2023-10-03 DIAGNOSIS — M75102 Unspecified rotator cuff tear or rupture of left shoulder, not specified as traumatic: Secondary | ICD-10-CM

## 2023-10-03 DIAGNOSIS — M19012 Primary osteoarthritis, left shoulder: Secondary | ICD-10-CM

## 2023-10-03 DIAGNOSIS — G8929 Other chronic pain: Secondary | ICD-10-CM

## 2023-10-03 NOTE — Progress Notes (Signed)
 PROVIDER NOTE: Interpretation of information contained herein should be left to medically-trained personnel. Specific patient instructions are provided elsewhere under Patient Instructions section of medical record. This document was created in part using AI and STT-dictation technology, any transcriptional errors that may result from this process are unintentional.  Patient: Eric Guerrero  Service: E/M   PCP: Albina GORMAN Dine, MD  DOB: 21-Dec-1950  DOS: 10/03/2023  Provider: Wallie Sherry, MD  MRN: [id number]  Delivery: Virtual Visit  Specialty: Interventional Pain Management  Type: Established Patient  Setting: Ambulatory outpatient facility  Specialty designation: 09  Referring Prov.: Albina GORMAN Dine, MD  Location: Remote location       Virtual Encounter - Pain Management PROVIDER NOTE: Information contained herein reflects review and annotations entered in association with encounter. Interpretation of such information and data should be left to medically-trained personnel. Information provided to patient can be located elsewhere in the medical record under Patient Instructions. Document created using STT-dictation technology, any transcriptional errors that may result from process are unintentional.    Contact & Pharmacy Preferred: [phone] Home: [phone] (home) Mobile: [phone] (mobile) E-mail: [email or web address] [facility] [address] ([address]), [place] - [address] [place]) [place] [address] Phone: [phone] Fax: [phone]   Pre-screening  Eric Guerrero offered in-person vs virtual encounter. He indicated preferring virtual for this encounter.   Reason COVID-19*  Social distancing based on CDC and AMA recommendations.   I contacted Eric Guerrero on 10/03/2023 via telephone.      I clearly identified myself as Wallie Sherry, MD. I verified that I was speaking with the correct person using two identifiers (Name:  Eric Guerrero, and date of birth: 1950/09/09).  Consent I sought verbal advanced consent from Eric Guerrero for virtual visit interactions. I informed Eric Guerrero of possible security and privacy concerns, risks, and limitations associated with providing not-in-person medical evaluation and management services. I also informed Eric Guerrero of the availability of in-person appointments. Finally, I informed him that there would be a charge for the virtual visit and that he could be  personally, fully or partially, financially responsible for it. Eric Guerrero expressed understanding and agreed to proceed.   Historic Elements   Eric Guerrero is a 73 y.o. year old, male patient evaluated today after our last contact on 08/29/2023. Eric Guerrero  has a past medical history of Diabetes mellitus without complication (HCC), Hyperlipidemia, and Hypertension. He also  has a past surgical history that includes LEFT HEART CATH AND CORONARY ANGIOGRAPHY (Left, 09/11/2020). Eric Guerrero has a current medication list which includes the following prescription(s): onetouch verio, losartan , pantoprazole , ranolazine , rosuvastatin , and synjardy . He  reports that he has never smoked. He has never used smokeless tobacco. He reports that he does not drink alcohol and does not use drugs. Eric Guerrero has no known allergies.  BMI: Estimated body mass index is 29.19 kg/m as calculated from the following:   Height as of 09/20/23: 5' 8 (1.727 m).   Weight as of 09/20/23: 192 lb (87.1 kg). Last encounter: 08/09/2023. Last procedure: 08/29/2023.  HPI  Today, he is being contacted for a post-procedure assessment.  Post-Procedure Evaluation   Procedure: Suprascapular nerve block (SSNB) #1  Laterality:  Left  Level: Superior to scapular spine, lateral to supraspinatus fossa (Suprascapular notch).  Imaging: Fluoroscopic guidance         Anesthesia: Local anesthesia (1-2% Lidocaine ) Sedation: No Sedation  DOS: 08/29/2023   Performed by: Wallie Sherry, MD  Purpose: Diagnostic/Therapeutic Indications: Shoulder pain, severe enough to impact quality of life and/or function. Left shoulder pain  NAS-11 score:   Pre-procedure: 4/10   Post-procedure: 0-No pain/10   Type: Therapeutic Sacroiliac Joint Steroid Injection #2 and left piriformis TPI #2 Region: Inferior Lumbosacral Region Level: PIIS (Posterior Inferior Iliac Spine) Laterality: Left-Side   Type: Local Anesthesia   Local Anesthetic: Lidocaine  1-2%   Position: Prone            Indications: 1. SI joint arthritis (left > right)   2. Sacroiliac joint pain   3. Piriformis syndrome (Left)   4. Rotator cuff tear arthropathy (Left)   5. Chronic left shoulder pain     Pain Score: Pre-procedure: 3/10 Post-procedure: 0-No pain/10      Effectiveness:  Initial hour after procedure: 90 %  Subsequent 4-6 hours post-procedure: 90 %  Analgesia past initial 6 hours: 40 % -50% for shoulder and 90% for the left SIJ Ongoing improvement:  Analgesic:  40-50% for the shoulder, and 90% for the left SIJ Function: Somewhat improved ROM: Somewhat improved     Laboratory Chemistry Profile   Renal Lab Results  Component Value Date   BUN 14 09/19/2023   CREATININE 1.30 (H) 09/19/2023   BCR 11 09/19/2023    Hepatic Lab Results  Component Value Date   AST 17 09/19/2023   ALT 19 09/19/2023   ALBUMIN 4.1 09/19/2023   ALKPHOS 101 09/19/2023    Electrolytes Lab Results  Component Value Date   NA 142 09/19/2023   K 4.9 09/19/2023   CL 104 09/19/2023   CALCIUM  9.2 09/19/2023   MG 2.3 11/04/2021    Bone Lab Results  Component Value Date   25OHVITD1 16 (L) 11/04/2021   25OHVITD2 <1.0 11/04/2021   25OHVITD3 16 11/04/2021    Inflammation (CRP: Acute Phase) (ESR: Chronic Phase) Lab Results  Component Value Date   CRP 5 11/04/2021   ESRSEDRATE 35 (H) 11/04/2021         Note: Above Lab results reviewed.  Imaging  DG PAIN CLINIC C-ARM 1-60 MIN  NO REPORT Fluoro was used, but no Radiologist interpretation will be provided.  Please refer to NOTES tab for provider progress note.  Assessment  The primary encounter diagnosis was SI joint arthritis (left > right). Diagnoses of Sacroiliac joint pain, Piriformis syndrome (Left), Rotator cuff tear arthropathy (Left), Chronic left shoulder pain, and Primary osteoarthritis of left shoulder were also pertinent to this visit.  Plan of Care  Postprocedural follow-up after left suprascapular nerve block and left SI joint and left piriformis injection.  Patient notes approximately 90% pain relief after his left SI joint and left piriformis injection.  Unfortunately, he experienced less pain relief with his left suprascapular nerve block rating it at approximately 40 to 50%.  He still states that it is better than before and he has improved range of motion.  He is wondering what the next steps are.  We discussed repeating suprascapular nerve block and adding additional volume versus radiofrequency ablation of the left suprascapular nerve.  Patient would like to repeat left suprascapular nerve block and then will consider radiofrequency ablation if the repeat nerve block is not as helpful.  Risk and benefits reviewed and patient would like to proceed.  Orders:  Orders Placed This Encounter  Procedures   SUPRASCAPULAR NERVE BLOCK    For shoulder pain.    Standing Status:   Future  Expected Date:   10/17/2023    Expiration Date:   10/02/2024    Scheduling Instructions:     Purpose: Therapeutic     Laterality:LEFT     Level(s): Suprascapular notch     Sedation: Patient's choice.     Scheduling Timeframe: As permitted by the schedule    Where will this procedure be performed?:   [facility]   Follow-up plan:   Return in about 2 weeks (around 10/17/2023) for Left SSNB #2 (more volume), in clinic [facility].     Recent Visits Date Type Provider Dept  08/29/23 Procedure visit Marcelino Nurse, MD  [facility]  08/09/23 Office Visit Marcelino Nurse, MD [facility]  Showing recent visits within past 90 days and meeting all other requirements Today's Visits Date Type Provider Dept  10/03/23 Office Visit Marcelino Nurse, MD [facility]  Showing today's visits and meeting all other requirements Future Appointments No visits were found meeting these conditions. Showing future appointments within next 90 days and meeting all other requirements  I discussed the assessment and treatment plan with the patient. The patient was provided an opportunity to ask questions and all were answered. The patient agreed with the plan and demonstrated an understanding of the instructions.  Patient advised to call back or seek an in-person evaluation if the symptoms or condition worsens.  Duration of encounter: .  Note by: Nurse Marcelino, MD Date: 10/03/2023; Time: 3:29 PM

## 2023-10-19 ENCOUNTER — Ambulatory Visit
Admission: RE | Admit: 2023-10-19 | Discharge: 2023-10-19 | Disposition: A | Discharge status: Home / Self Care | Source: Ambulatory Visit | Attending: Student in an Organized Health Care Education/Training Program | Admitting: Student in an Organized Health Care Education/Training Program

## 2023-10-19 ENCOUNTER — Ambulatory Visit: Admitting: Student in an Organized Health Care Education/Training Program

## 2023-10-19 ENCOUNTER — Encounter: Payer: Self-pay | Admitting: Student in an Organized Health Care Education/Training Program

## 2023-10-19 VITALS — BP 146/82 | HR 78 | Temp 98.7°F | Resp 16 | Ht 68.0 in | Wt 192.0 lb

## 2023-10-19 DIAGNOSIS — M19012 Primary osteoarthritis, left shoulder: Secondary | ICD-10-CM

## 2023-10-19 DIAGNOSIS — G8929 Other chronic pain: Secondary | ICD-10-CM | POA: Insufficient documentation

## 2023-10-19 DIAGNOSIS — M25512 Pain in left shoulder: Secondary | ICD-10-CM | POA: Insufficient documentation

## 2023-10-19 DIAGNOSIS — M75102 Unspecified rotator cuff tear or rupture of left shoulder, not specified as traumatic: Secondary | ICD-10-CM

## 2023-10-19 DIAGNOSIS — M12812 Other specific arthropathies, not elsewhere classified, left shoulder: Secondary | ICD-10-CM | POA: Diagnosis not present

## 2023-10-19 MED ORDER — LIDOCAINE HCL 2 % IJ SOLN
20.0000 mL | Freq: Once | INTRAMUSCULAR | Status: AC
  Administered 2023-10-19: 400 mg

## 2023-10-19 MED ORDER — ROPIVACAINE HCL 2 MG/ML IJ SOLN
9.0000 mL | Freq: Once | INTRAMUSCULAR | Status: AC
  Administered 2023-10-19: 9 mL via PERINEURAL

## 2023-10-19 MED ORDER — DEXAMETHASONE SODIUM PHOSPHATE 10 MG/ML IJ SOLN
10.0000 mg | Freq: Once | INTRAMUSCULAR | Status: AC
  Administered 2023-10-19: 10 mg

## 2023-10-19 MED ORDER — IOHEXOL 180 MG/ML  SOLN
10.0000 mL | Freq: Once | INTRAMUSCULAR | Status: AC
  Administered 2023-10-19: 10 mL via INTRA_ARTICULAR

## 2023-10-19 NOTE — Progress Notes (Signed)
 Safety precautions to be maintained throughout the outpatient stay will include: orient to surroundings, keep bed in low position, maintain call bell within reach at all times, provide assistance with transfer out of bed and ambulation.

## 2023-10-19 NOTE — Progress Notes (Signed)
 PROVIDER NOTE: Interpretation of information contained herein should be left to medically-trained personnel. Specific patient instructions are provided elsewhere under Patient Instructions section of medical record. This document was created in part using STT-dictation technology, any transcriptional errors that may result from this process are unintentional.  Patient: Eric Guerrero Type: Established DOB: 16-Dec-1950 MRN: [id number] PCP: Albina GORMAN Dine, MD  Service: Procedure DOS: 10/19/2023 Setting: Ambulatory Location: Ambulatory outpatient facility Delivery: Face-to-face Provider: Wallie Sherry, MD Specialty: Interventional Pain Management Specialty designation: 09 Location: Outpatient facility Ref. Prov.: Albina GORMAN Dine, MD       Interventional Therapy   Procedure: Suprascapular nerve block (SSNB) #2  Laterality:  Left  Level: Superior to scapular spine, lateral to supraspinatus fossa (Suprascapular notch).  Imaging: Fluoroscopic guidance         Anesthesia: Local anesthesia (1-2% Lidocaine ) Sedation:                         DOS: 10/19/2023  Performed by: Wallie Sherry, MD  Purpose: Diagnostic/Therapeutic Indications: Shoulder pain, severe enough to impact quality of life and/or function. Left shoulder pain  NAS-11 score:   Pre-procedure: 4/10   Post-procedure: 0-No pain/10     Target: Suprascapular nerve Location: midway between the medial border of the scapula and the acromion as it runs through the suprascapular notch. Region: Suprascapular, posterior shoulder  Approach: Percutaneous  Neuroanatomy: The suprascapular nerve is the lateral branch of the superior trunk of the brachial plexus. It receives nerve fibers that originate in the nerve roots C5 and C6 (and sometimes C4). It is a mixed nerve, meaning that it provides both sensory and motor supply for the suprascapular region. Function: The main function of this nerve is to provide motor innervation for two  muscles, the supraspinatus and infraspinatus muscles. They are part of the rotator cuff muscles. In addition, the suprascapular nerve provides a sensory supply to the joints of the scapula (glenohumeral and acromioclavicular joints). Rationale (medical necessity): procedure needed and proper for the diagnosis and/or treatment of the patient's medical symptoms and needs.  Position / Prep / Materials:  Position: Prone Materials:  Tray: Block Needle(s):  Type: Spinal  Gauge (G): 22  Length: 3.5 in.  Qty: 1 Prep solution: ChloraPrep (2% chlorhexidine gluconate and 70% isopropyl alcohol) Prep Area: Entire posterior shoulder area. From upper spine to shoulder proper (upper arm), and from lateral neck to lower tip of shoulder blade.   H&P (Pre-op Assessment):  Mr. Kelly is a 73 y.o. (year old), male patient, seen today for interventional treatment. He  has a past surgical history that includes LEFT HEART CATH AND CORONARY ANGIOGRAPHY (Left, 09/11/2020). Mr. Pease has a current medication list which includes the following prescription(s): onetouch verio, losartan , pantoprazole , ranolazine , rosuvastatin , and synjardy . His primarily concern today is the Shoulder Pain (left)  Initial Vital Signs:  Pulse/HCG Rate: 78ECG Heart Rate: 74 Temp: 98.7 F (37.1 C) Resp: 15 BP: (!) 145/83 SpO2: 100 %  BMI: Estimated body mass index is 29.19 kg/m as calculated from the following:   Height as of this encounter: 5' 8 (1.727 m).   Weight as of this encounter: 192 lb (87.1 kg).  Risk Assessment: Allergies: Reviewed. He has no known allergies.  Allergy Precautions: None required Coagulopathies: Reviewed. None identified.  Blood-thinner therapy: None at this time Active Infection(s): Reviewed. None identified. Mr. Barradas is afebrile  Site Confirmation: Mr. Hammen was asked to confirm the procedure and laterality before marking the site Procedure checklist:  Completed Consent: Before the procedure and under the  influence of no sedative(s), amnesic(s), or anxiolytics, the patient was informed of the treatment options, risks and possible complications. To fulfill our ethical and legal obligations, as recommended by the American Medical Association's Code of Ethics, I have informed the patient of my clinical impression; the nature and purpose of the treatment or procedure; the risks, benefits, and possible complications of the intervention; the alternatives, including doing nothing; the risk(s) and benefit(s) of the alternative treatment(s) or procedure(s); and the risk(s) and benefit(s) of doing nothing. The patient was provided information about the general risks and possible complications associated with the procedure. These may include, but are not limited to: failure to achieve desired goals, infection, bleeding, organ or nerve damage, allergic reactions, paralysis, and death. In addition, the patient was informed of those risks and complications associated to the procedure, such as failure to decrease pain; infection; bleeding; organ or nerve damage with subsequent damage to sensory, motor, and/or autonomic systems, resulting in permanent pain, numbness, and/or weakness of one or several areas of the body; allergic reactions; (i.e.: anaphylactic reaction); and/or death. Furthermore, the patient was informed of those risks and complications associated with the medications. These include, but are not limited to: allergic reactions (i.e.: anaphylactic or anaphylactoid reaction(s)); adrenal axis suppression; blood sugar elevation that in diabetics may result in ketoacidosis or comma; water retention that in patients with history of congestive heart failure may result in shortness of breath, pulmonary edema, and decompensation with resultant heart failure; weight gain; swelling or edema; medication-induced neural toxicity; particulate matter embolism and blood vessel occlusion with resultant organ, and/or nervous system  infarction; and/or aseptic necrosis of one or more joints. Finally, the patient was informed that Medicine is not an exact science; therefore, there is also the possibility of unforeseen or unpredictable risks and/or possible complications that may result in a catastrophic outcome. The patient indicated having understood very clearly. We have given the patient no guarantees and we have made no promises. Enough time was given to the patient to ask questions, all of which were answered to the patient's satisfaction. Mr. Guillette has indicated that he wanted to continue with the procedure. Attestation: I, the ordering provider, attest that I have discussed with the patient the benefits, risks, side-effects, alternatives, likelihood of achieving goals, and potential problems during recovery for the procedure that I have provided informed consent. Date  Time: 10/19/2023  8:14 AM  Pre-Procedure Preparation:  Monitoring: As per clinic protocol. Respiration, ETCO2, SpO2, BP, heart rate and rhythm monitor placed and checked for adequate function Safety Precautions: Patient was assessed for positional comfort and pressure points before starting the procedure. Time-out: I initiated and conducted the Time-out before starting the procedure, as per protocol. The patient was asked to participate by confirming the accuracy of the Time Out information. Verification of the correct person, site, and procedure were performed and confirmed by me, the nursing staff, and the patient. Time-out conducted as per Joint Commission's Universal Protocol (UP.01.01.01). Time: 0839 Start Time: 0839 hrs.  Description of Procedure:          Procedural Technique Safety Precautions: Aspiration looking for blood return was conducted prior to all injections. At no point did we inject any substances, as a needle was being advanced. No attempts were made at seeking any paresthesias. Safe injection practices and needle disposal techniques  used. Medications properly checked for expiration dates. SDV (single dose vial) medications used. Description of the Procedure: Protocol guidelines were  followed. The patient was placed in position over the procedure table. The target area was identified and the area prepped in the usual manner. Skin & deeper tissues infiltrated with local anesthetic. Appropriate amount of time allowed to pass for local anesthetics to take effect. The procedure needles were then advanced to the target area. Proper needle placement secured. Negative aspiration confirmed. Solution injected in intermittent fashion, asking for systemic symptoms every 0.5cc of injectate. The needles were then removed and the area cleansed, making sure to leave some of the prepping solution back to take advantage of its long term bactericidal properties. 5 cc solution made of 4 cc of 0.2% ropivacaine , 1 cc of Decadron  10 mg/cc.  Injected along the left suprascapular notch after contrast confirmation   Vitals:   10/19/23 0818 10/19/23 0839 10/19/23 0844  BP: (!) 145/83 (!) 142/80 (!) 146/82  Pulse: 78    Resp:  15 16  Temp: 98.7 F (37.1 C)    SpO2: 100% 100% 100%  Weight: 192 lb (87.1 kg)    Height: 5' 8 (1.727 m)       Start Time: 0839 hrs. End Time: 0842 hrs.  Imaging Guidance (Spinal):         Type of Imaging Technique: Fluoroscopy Guidance (non Spinal) Indication(s): Fluoroscopy guidance for needle placement to enhance accuracy in procedures requiring precise needle localization for targeted delivery of medication in or near specific anatomical locations not easily accessible without such real-time imaging assistance. Exposure Time: Please see nurses notes. Contrast: Before injecting any contrast, we confirmed that the patient did not have an allergy to iodine, shellfish, or radiological contrast. Once satisfactory needle placement was completed at the desired level, radiological contrast was injected. Contrast injected under  live fluoroscopy. No contrast complications. See chart for type and volume of contrast used. Fluoroscopic Guidance: I was personally present during the use of fluoroscopy. Tunnel Vision Technique used to obtain the best possible view of the target area. Parallax error corrected before commencing the procedure. Direction-depth-direction technique used to introduce the needle under continuous pulsed fluoroscopy. Once target was reached, antero-posterior, oblique, and lateral fluoroscopic projection used confirm needle placement in all planes. Images permanently stored in EMR. Interpretation: I personally interpreted the imaging intraoperatively. Adequate needle placement confirmed in multiple planes. Appropriate spread of contrast into desired area was observed. No evidence of afferent or efferent intravascular uptake. No intrathecal or subarachnoid spread observed. Permanent images saved into the patient's record.  Antibiotic Prophylaxis:   Anti-infectives (From admission, onward)    None      Indication(s): None identified   Post-operative Assessment:  Post-procedure Vital Signs:  Pulse/HCG Rate: 7878 Temp: 98.7 F (37.1 C) Resp: 16 BP: (!) 146/82 SpO2: 100 %  EBL: None  Complications: No immediate post-treatment complications observed by team, or reported by patient.  Note: The patient tolerated the entire procedure well. A repeat set of vitals were taken after the procedure and the patient was kept under observation following institutional policy, for this type of procedure. Post-procedural neurological assessment was performed, showing return to baseline, prior to discharge. The patient was provided with post-procedure discharge instructions, including a section on how to identify potential problems. Should any problems arise concerning this procedure, the patient was given instructions to immediately contact us , at any time, without hesitation. In any case, we plan to contact the  patient by telephone for a follow-up status report regarding this interventional procedure.  Comments:  No additional relevant information.  Plan of Care (POC)  Orders:  Orders Placed This Encounter  Procedures   DG PAIN CLIN[facility] C-ARM 1-60 MIN NO REPORT    Intraoperative interpretation by procedural physician at [facility].    Standing Status:   Standing    Number of Occurrences:   1    Reason for exam::   Assistance in needle guidance and placement for procedures requiring needle placement in or near specific anatomical locations not easily accessible without such assistance.    Medications ordered for procedure: Meds ordered this encounter  Medications   iohexol  (OMNIPAQUE ) 180 MG/ML injection 10 mL    Must be Myelogram-compatible. If not available, you may substitute with a water-soluble, non-ionic, hypoallergenic, myelogram-compatible radiological contrast medium.   lidocaine  (XYLOCAINE ) 2 % (with pres) injection 400 mg   ropivacaine  (PF) 2 mg/mL (0.2%) (NAROPIN ) injection 9 mL   dexamethasone  (DECADRON ) injection 10 mg   Medications administered: We administered iohexol , lidocaine , ropivacaine  (PF) 2 mg/mL (0.2%), and dexamethasone .  See the medical record for exact dosing, route, and time of administration.   Follow-up plan:   Return in about 8 weeks (around 12/14/2023) for PPE, VV.     Recent Visits Date Type Provider Dept  10/03/23 Office Visit Marcelino Nurse, MD [facility]  08/29/23 Procedure visit Marcelino Nurse, MD [facility]  08/09/23 Office Visit Marcelino Nurse, MD [facility]  Showing recent visits within past 90 days and meeting all other requirements Today's Visits Date Type Provider Dept  10/19/23 Procedure visit Marcelino Nurse, MD [facility]  Showing today's visits and meeting all other requirements Future Appointments Date Type Provider Dept  12/15/23 Appointment Marcelino Nurse, MD [facility]   Showing future appointments within next 90 days and meeting all other requirements   Disposition: Discharge home  Discharge (Date  Time): 10/19/2023; 0844 hrs.   Primary Care Physician: Albina GORMAN Dine, MD Location: [facility] Note by: Nurse Marcelino, MD (TTS technology used. I apologize for any typographical errors that were not detected and corrected.) Date: 10/19/2023; Time: 8:55 AM  Disclaimer:  Medicine is not an Visual merchandiser. The only guarantee in medicine is that nothing is guaranteed. It is important to note that the decision to proceed with this intervention was based on the information collected from the patient. The Data and conclusions were drawn from the patient's questionnaire, the interview, and the physical examination. Because the information was provided in large part by the patient, it cannot be guaranteed that it has not been purposely or unconsciously manipulated. Every effort has been made to obtain as much relevant data as possible for this evaluation. It is important to note that the conclusions that lead to this procedure are derived in large part from the available data. Always take into account that the treatment will also be dependent on availability of resources and existing treatment guidelines, considered by other Pain Management Practitioners as being common knowledge and practice, at the time of the intervention. For Medico-Legal purposes, it is also important to point out that variation in procedural techniques and pharmacological choices are the acceptable norm. The indications, contraindications, technique, and results of the above procedure should only be interpreted and judged by a Board-Certified Interventional Pain Specialist with extensive familiarity and expertise in the same exact procedure and technique.

## 2023-10-19 NOTE — Patient Instructions (Signed)

## 2023-10-20 ENCOUNTER — Telehealth: Payer: Self-pay

## 2023-10-20 NOTE — Telephone Encounter (Signed)
 Post procedure follow up.  LM

## 2023-10-21 ENCOUNTER — Encounter: Payer: Self-pay | Admitting: Cardiovascular Disease

## 2023-10-21 ENCOUNTER — Ambulatory Visit: Admitting: Cardiovascular Disease

## 2023-10-21 VITALS — BP 115/80 | HR 67 | Ht 68.0 in | Wt 190.8 lb

## 2023-10-21 DIAGNOSIS — I10 Essential (primary) hypertension: Secondary | ICD-10-CM | POA: Diagnosis not present

## 2023-10-21 DIAGNOSIS — I70218 Atherosclerosis of native arteries of extremities with intermittent claudication, other extremity: Secondary | ICD-10-CM

## 2023-10-21 DIAGNOSIS — I739 Peripheral vascular disease, unspecified: Secondary | ICD-10-CM | POA: Diagnosis not present

## 2023-10-21 DIAGNOSIS — R0789 Other chest pain: Secondary | ICD-10-CM

## 2023-10-21 NOTE — Progress Notes (Signed)
 Cardiology Office Note   Date:  10/21/2023   ID:  Eric Guerrero, DOB 03-05-1951, MRN [id number]  PCP:  Albina GORMAN Dine, MD  Cardiologist:  Denyse Bathe, MD      History of Present Illness: Eric Guerrero is a 73 y.o. male who presents for  Chief Complaint  Patient presents with   Follow-up    3 month    Had shoulder inj from Dr. Lazarus, and feeling better, no chest pain.      Past Medical History:  Diagnosis Date   Diabetes mellitus without complication (HCC)    Hyperlipidemia    Hypertension      Past Surgical History:  Procedure Laterality Date   LEFT HEART CATH AND CORONARY ANGIOGRAPHY Left 09/11/2020   Procedure: LEFT HEART CATH AND CORONARY ANGIOGRAPHY;  Surgeon: Bathe Denyse LABOR, MD;  Location: [facility];  Service: Cardiovascular;  Laterality: Left;     Current Outpatient Medications  Medication Sig Dispense Refill   glucose blood (ONETOUCH VERIO) test strip USE TO CHECK GLUCOSE TWICE DAILY 50 each 11   losartan  (COZAAR ) 25 MG tablet Take 1 tablet by mouth once daily 30 tablet 0   pantoprazole  (PROTONIX ) 40 MG tablet Take 1 tablet by mouth once daily 90 tablet 0   ranolazine  (RANEXA ) 500 MG 12 hr tablet Take 1 tablet by mouth twice daily 60 tablet 0   rosuvastatin  (CRESTOR ) 20 MG tablet TAKE 1 TABLET BY MOUTH AT BEDTIME 90 tablet 0   SYNJARDY  07-998 MG TABS Take 1 tablet by mouth twice daily 180 tablet 0   No current facility-administered medications for this visit.    Allergies:   Patient has no known allergies.    Social History:   reports that he has never smoked. He has never used smokeless tobacco. He reports that he does not drink alcohol and does not use drugs.   Family History:  family history is not on file.    ROS:     Review of Systems  Constitutional: Negative.   HENT: Negative.    Eyes: Negative.   Respiratory: Negative.    Gastrointestinal: Negative.   Genitourinary: Negative.   Musculoskeletal: Negative.   Skin:  Negative.   Neurological: Negative.   Endo/Heme/Allergies: Negative.   Psychiatric/Behavioral: Negative.    All other systems reviewed and are negative.     All other systems are reviewed and negative.    PHYSICAL EXAM: VS:  BP 115/80   Pulse 67   Ht 5' 8 (1.727 m)   Wt 190 lb 12.8 oz (86.5 kg)   SpO2 99%   BMI 29.01 kg/m  , BMI Body mass index is 29.01 kg/m. Last weight:  Wt Readings from Last 3 Encounters:  10/21/23 190 lb 12.8 oz (86.5 kg)  10/19/23 192 lb (87.1 kg)  09/20/23 192 lb (87.1 kg)     Physical Exam Vitals reviewed.  Constitutional:      Appearance: Normal appearance. He is normal weight.  HENT:     Head: Normocephalic.     Nose: Nose normal.     Mouth/Throat:     Mouth: Mucous membranes are moist.  Eyes:     Pupils: Pupils are equal, round, and reactive to light.  Cardiovascular:     Rate and Rhythm: Normal rate and regular rhythm.     Pulses: Normal pulses.     Heart sounds: Normal heart sounds.  Pulmonary:     Effort: Pulmonary effort is normal.  Abdominal:  General: Abdomen is flat. Bowel sounds are normal.  Musculoskeletal:        General: Normal range of motion.     Cervical back: Normal range of motion.  Skin:    General: Skin is warm.  Neurological:     General: No focal deficit present.     Mental Status: He is alert.  Psychiatric:        Mood and Affect: Mood normal.       EKG:   Recent Labs: 09/19/2023: ALT 19; BUN 14; Creatinine, Ser 1.30; Potassium 4.9; Sodium 142    Lipid Panel    Component Value Date/Time   CHOL 153 09/19/2023 1028   TRIG 133 09/19/2023 1028   HDL 42 09/19/2023 1028   CHOLHDL 3.6 09/19/2023 1028   LDLCALC 87 09/19/2023 1028      Other studies Reviewed: Additional studies/ records that were reviewed today include:  Review of the above records demonstrates:       No data to display            ASSESSMENT AND PLAN:    ICD-10-CM   1. Chest pain, non-cardiac  R07.89    Feeling  better, after shoulder inj. Cath was negative.    2. Claudication of left upper extremity due to atherosclerosis (HCC)  I70.218     3. PAD (peripheral artery disease) (HCC)  I73.9     4. Primary hypertension  I10        Problem List Items Addressed This Visit       Cardiovascular and Mediastinum   Claudication of left upper extremity due to atherosclerosis (HCC) (Chronic)   PAD (peripheral artery disease) (HCC)   Primary hypertension   Other Visit Diagnoses       Chest pain, non-cardiac    -  Primary   Feeling better, after shoulder inj. Cath was negative.          Disposition:   Return in about 4 months (around 02/21/2024).    Total time spent: 30 minutes  Signed,  Denyse Bathe, MD  10/21/2023 10:37 AM    [facility]

## 2023-10-25 ENCOUNTER — Other Ambulatory Visit: Payer: Self-pay | Admitting: Cardiovascular Disease

## 2023-11-19 ENCOUNTER — Other Ambulatory Visit: Payer: Self-pay | Admitting: Internal Medicine

## 2023-11-19 DIAGNOSIS — E782 Mixed hyperlipidemia: Secondary | ICD-10-CM

## 2023-11-23 ENCOUNTER — Other Ambulatory Visit: Payer: Self-pay | Admitting: Cardiology

## 2023-11-28 ENCOUNTER — Other Ambulatory Visit: Payer: Self-pay | Admitting: Internal Medicine

## 2023-11-28 DIAGNOSIS — E119 Type 2 diabetes mellitus without complications: Secondary | ICD-10-CM

## 2023-12-15 ENCOUNTER — Ambulatory Visit
Attending: Student in an Organized Health Care Education/Training Program | Admitting: Student in an Organized Health Care Education/Training Program

## 2023-12-15 DIAGNOSIS — G8929 Other chronic pain: Secondary | ICD-10-CM

## 2023-12-15 DIAGNOSIS — M19012 Primary osteoarthritis, left shoulder: Secondary | ICD-10-CM | POA: Diagnosis not present

## 2023-12-15 DIAGNOSIS — M25512 Pain in left shoulder: Secondary | ICD-10-CM | POA: Diagnosis not present

## 2023-12-15 DIAGNOSIS — M12812 Other specific arthropathies, not elsewhere classified, left shoulder: Secondary | ICD-10-CM

## 2023-12-15 DIAGNOSIS — M461 Sacroiliitis, not elsewhere classified: Secondary | ICD-10-CM | POA: Diagnosis not present

## 2023-12-15 DIAGNOSIS — M75102 Unspecified rotator cuff tear or rupture of left shoulder, not specified as traumatic: Secondary | ICD-10-CM

## 2023-12-15 MED ORDER — CELECOXIB 100 MG PO CAPS: 100.0000 mg | ORAL_CAPSULE | Freq: Two times a day (BID) | ORAL | 1 refills | Status: AC

## 2023-12-15 NOTE — Progress Notes (Signed)
 PROVIDER NOTE: Interpretation of information contained herein should be left to medically-trained personnel. Specific patient instructions are provided elsewhere under Patient Instructions section of medical record. This document was created in part using AI and STT-dictation technology, any transcriptional errors that may result from this process are unintentional.  Patient: Eric Guerrero  Service: E/M   PCP: Albina GORMAN Dine, MD  DOB: [birth year]  DOS: 12/15/2023  Provider: Wallie Sherry, MD  MRN: [id number]  Delivery: Virtual Visit  Specialty: Interventional Pain Management  Type: Established Patient  Setting: Ambulatory outpatient facility  Specialty designation: 09  Referring Prov.: Albina GORMAN Dine, MD  Location: Remote location       Virtual Encounter - Pain Management PROVIDER NOTE: Information contained herein reflects review and annotations entered in association with encounter. Interpretation of such information and data should be left to medically-trained personnel. Information provided to patient can be located elsewhere in the medical record under Patient Instructions. Document created using STT-dictation technology, any transcriptional errors that may result from process are unintentional.    Contact & Pharmacy Preferred: [phone] Home: [phone] (home) Mobile: [phone] (mobile) E-mail: [facility] [address]), [place] - [address] [place]) [place] [address] Phone: [phone] Fax: [phone]   Pre-screening  Mr. Banko offered in-person vs virtual encounter. He indicated preferring virtual for this encounter.   Reason COVID-19*  Social distancing based on CDC and AMA recommendations.   I contacted Haydon Dorris on 12/15/2023 via telephone.      I clearly identified myself as Wallie Sherry, MD. I verified that I was speaking with the correct person using two identifiers (Name:  Ohm Dentler, and date of birth: [birth year]).  Consent I sought verbal advanced consent from Eric Guerrero for virtual visit interactions. I informed Mr. Robbins of possible security and privacy concerns, risks, and limitations associated with providing not-in-person medical evaluation and management services. I also informed Mr. Widen of the availability of in-person appointments. Finally, I informed him that there would be a charge for the virtual visit and that he could be  personally, fully or partially, financially responsible for it. Mr. Dockendorf expressed understanding and agreed to proceed.   Historic Elements   Mr. Amadeo Coke is a 73 y.o. year old, male patient evaluated today after our last contact on 10/19/2023. Mr. Monds  has a past medical history of Diabetes mellitus without complication (HCC), Hyperlipidemia, and Hypertension. He also  has a past surgical history that includes LEFT HEART CATH AND CORONARY ANGIOGRAPHY (Left, 09/11/2020). Mr. Torrance has a current medication list which includes the following prescription(s): celecoxib , onetouch verio, losartan , pantoprazole , ranolazine , rosuvastatin , and synjardy . He  reports that he has never smoked. He has never used smokeless tobacco. He reports that he does not drink alcohol and does not use drugs. Mr. Concannon has no known allergies.  BMI: Estimated body mass index is 29.01 kg/m as calculated from the following:   Height as of 10/21/23: 5' 8 (1.727 m).   Weight as of 10/21/23: 190 lb 12.8 oz (86.5 kg). Last encounter: 10/03/2023. Last procedure: 10/19/2023.  HPI  Today, he is being contacted for a post-procedure assessment.  Post-Procedure Evaluation   Procedure: Suprascapular nerve block (SSNB) #2  Laterality:  Left  Level: Superior to scapular spine, lateral to supraspinatus fossa (Suprascapular notch).  Imaging: Fluoroscopic guidance         Anesthesia: Local anesthesia (1-2% Lidocaine ) Sedation:  DOS: 10/19/2023   Performed by: Wallie Sherry, MD  Purpose: Diagnostic/Therapeutic Indications: Shoulder pain, severe enough to impact quality of life and/or function. Left shoulder pain  NAS-11 score:   Pre-procedure: 4/10   Post-procedure: 0-No pain/10     Effectiveness:   Analgesia past initial 6 hours: 50 % (continues to have pain in left shoulder, 50% improved.)  Ongoing improvement: 50% Analgesic: 50% Function: Somewhat improved ROM: Somewhat improved   No results found for: CBDTHCR, D8THCCBX, D9THCCBX  Laboratory Chemistry Profile   Renal Lab Results  Component Value Date   BUN 14 09/19/2023   CREATININE 1.30 (H) 09/19/2023   BCR 11 09/19/2023    Hepatic Lab Results  Component Value Date   AST 17 09/19/2023   ALT 19 09/19/2023   ALBUMIN 4.1 09/19/2023   ALKPHOS 101 09/19/2023    Electrolytes Lab Results  Component Value Date   NA 142 09/19/2023   K 4.9 09/19/2023   CL 104 09/19/2023   CALCIUM  9.2 09/19/2023   MG 2.3 11/04/2021    Bone Lab Results  Component Value Date   25OHVITD1 16 (L) 11/04/2021   25OHVITD2 <1.0 11/04/2021   25OHVITD3 16 11/04/2021    Inflammation (CRP: Acute Phase) (ESR: Chronic Phase) Lab Results  Component Value Date   CRP 5 11/04/2021   ESRSEDRATE 35 (H) 11/04/2021         Note: Above Lab results reviewed.  Imaging  DG PAIN CLINIC C-ARM 1-60 MIN NO REPORT Fluoro was used, but no Radiologist interpretation will be provided.  Please refer to NOTES tab for provider progress note.  Assessment  The primary encounter diagnosis was Rotator cuff tear arthropathy (Left). Diagnoses of Chronic left shoulder pain, Primary osteoarthritis of left shoulder, and SI joint arthritis (left > right) were also pertinent to this visit.  Plan of Care  Positive response to left suprascapular nerve block.  Patient states that his left shoulder pain is approximately 50% better and he does have improved range of motion.  He is developing more inferior  sided left shoulder pain.  We discussed that he could consider a left scapular nerve pulsed radiofrequency ablation if his pain radiates worse.  I also recommend that he do a trial of Celebrex  100 mg twice a day for the next month.  If this does not left shoulder pain I recommend that he make a face-to-face appointment for evaluation.  Mr. Taylan Marez has a current medication list which includes the following long-term medication(s): losartan , pantoprazole , ranolazine , and rosuvastatin .  Pharmacotherapy (Medications Ordered): Meds ordered this encounter  Medications   celecoxib  (CELEBREX ) 100 MG capsule    Sig: Take 1 capsule (100 mg total) by mouth 2 (two) times daily.    Dispense:  60 capsule    Refill:  1   Orders:  No orders of the defined types were placed in this encounter.  Follow-up plan:   Return for patient will call to schedule F2F appt prn.      Recent Visits Date Type Provider Dept  10/19/23 Procedure visit Sherry Wallie, MD [facility]  10/03/23 Office Visit Sherry Wallie, MD [facility]  Showing recent visits within past 90 days and meeting all other requirements Today's Visits Date Type Provider Dept  12/15/23 Office Visit Sherry Wallie, MD [facility]  Showing today's visits and meeting all other requirements Future Appointments No visits were found meeting these conditions. Showing future appointments within next 90 days and meeting all other requirements  I discussed  the assessment and treatment plan with the patient. The patient was provided an opportunity to ask questions and all were answered. The patient agreed with the plan and demonstrated an understanding of the instructions.  Patient advised to call back or seek an in-person evaluation if the symptoms or condition worsens.  Duration of encounter: 10 minutes.  Note by: Wallie Sherry, MD Date: 12/15/2023; Time: 3:34 PM

## 2023-12-19 ENCOUNTER — Other Ambulatory Visit: Payer: Self-pay

## 2023-12-19 MED ORDER — ACCU-CHEK GUIDE TEST VI STRP: ORAL_STRIP | 6 refills | Status: AC

## 2023-12-19 MED ORDER — ACCU-CHEK SOFTCLIX LANCETS MISC: 6 refills | Status: AC

## 2023-12-20 ENCOUNTER — Telehealth: Payer: Self-pay

## 2023-12-20 ENCOUNTER — Other Ambulatory Visit: Payer: Self-pay

## 2023-12-20 MED ORDER — ACCU-CHEK GUIDE W/DEVICE KIT
1.0000 | PACK | Freq: Every day | 0 refills | Status: AC
Start: 2023-12-20 — End: 2024-01-19

## 2023-12-20 NOTE — Telephone Encounter (Signed)
 Razia called regarding pt's glucometer & testing supplies, said the brand onetouch isn't being covered by insurance. She asked if we can send a different brand of glucometer, test strips & lancets for pt? Please advise

## 2023-12-20 NOTE — Telephone Encounter (Signed)
Already been sent to the pharmacy  

## 2023-12-21 ENCOUNTER — Ambulatory Visit: Admitting: Internal Medicine

## 2024-01-09 ENCOUNTER — Telehealth: Payer: Self-pay

## 2024-01-09 NOTE — Telephone Encounter (Signed)
 Pt wife LM asking for call back for her husband but did not leave a phone number or the reason for the call

## 2024-01-10 ENCOUNTER — Telehealth: Payer: Self-pay

## 2024-01-10 NOTE — Telephone Encounter (Signed)
 Pt's wife called stating pt used to be on depression medications and she believes he needs to go back on them. Please advise.

## 2024-01-12 ENCOUNTER — Telehealth: Payer: Self-pay

## 2024-01-12 NOTE — Telephone Encounter (Signed)
 Pt wife LM asking for call back

## 2024-02-03 ENCOUNTER — Other Ambulatory Visit

## 2024-02-03 DIAGNOSIS — E782 Mixed hyperlipidemia: Secondary | ICD-10-CM

## 2024-02-03 DIAGNOSIS — E119 Type 2 diabetes mellitus without complications: Secondary | ICD-10-CM

## 2024-02-04 LAB — COMPREHENSIVE METABOLIC PANEL WITH GFR
ALT: 18 IU/L (ref 0–44)
AST: 16 IU/L (ref 0–40)
Albumin: 4 g/dL (ref 3.8–4.8)
Alkaline Phosphatase: 104 IU/L (ref 47–123)
BUN/Creatinine Ratio: 11 (ref 10–24)
BUN: 13 mg/dL (ref 8–27)
Bilirubin Total: 0.5 mg/dL (ref 0.0–1.2)
CO2: 22 mmol/L (ref 20–29)
Calcium: 9.5 mg/dL (ref 8.6–10.2)
Chloride: 105 mmol/L (ref 96–106)
Creatinine, Ser: 1.15 mg/dL (ref 0.76–1.27)
Globulin, Total: 2.6 g/dL (ref 1.5–4.5)
Glucose: 112 mg/dL — ABNORMAL HIGH (ref 70–99)
Potassium: 4.9 mmol/L (ref 3.5–5.2)
Sodium: 140 mmol/L (ref 134–144)
Total Protein: 6.6 g/dL (ref 6.0–8.5)
eGFR: 67 mL/min/1.73 (ref >59)

## 2024-02-04 LAB — LIPID PANEL
Chol/HDL Ratio: 3.8 ratio (ref 0.0–5.0)
Cholesterol, Total: 138 mg/dL (ref 100–199)
HDL: 36 mg/dL — ABNORMAL LOW (ref >39)
LDL Chol Calc (NIH): 78 mg/dL (ref 0–99)
Triglycerides: 136 mg/dL (ref 0–149)
VLDL Cholesterol Cal: 24 mg/dL (ref 5–40)

## 2024-02-04 LAB — HEMOGLOBIN A1C
Est. average glucose Bld gHb Est-mCnc: 143 mg/dL
Hgb A1c MFr Bld: 6.6 % — ABNORMAL HIGH (ref 4.8–5.6)

## 2024-02-07 ENCOUNTER — Ambulatory Visit: Admitting: Internal Medicine

## 2024-02-07 ENCOUNTER — Ambulatory Visit: Payer: Self-pay | Admitting: Internal Medicine

## 2024-02-07 ENCOUNTER — Encounter: Payer: Self-pay | Admitting: Internal Medicine

## 2024-02-07 VITALS — BP 126/82 | HR 81 | Ht 68.0 in | Wt 192.0 lb

## 2024-02-07 DIAGNOSIS — Z0001 Encounter for general adult medical examination with abnormal findings: Secondary | ICD-10-CM | POA: Diagnosis not present

## 2024-02-07 DIAGNOSIS — E7800 Pure hypercholesterolemia, unspecified: Secondary | ICD-10-CM | POA: Diagnosis not present

## 2024-02-07 DIAGNOSIS — F419 Anxiety disorder, unspecified: Secondary | ICD-10-CM | POA: Diagnosis not present

## 2024-02-07 DIAGNOSIS — B351 Tinea unguium: Secondary | ICD-10-CM

## 2024-02-07 DIAGNOSIS — E119 Type 2 diabetes mellitus without complications: Secondary | ICD-10-CM | POA: Diagnosis not present

## 2024-02-07 DIAGNOSIS — R4181 Age-related cognitive decline: Secondary | ICD-10-CM

## 2024-02-07 DIAGNOSIS — I10 Essential (primary) hypertension: Secondary | ICD-10-CM

## 2024-02-07 LAB — POCT CBG (FASTING - GLUCOSE)-MANUAL ENTRY: Glucose Fasting, POC: 116 mg/dL — AB (ref 70–99)

## 2024-02-07 MED ORDER — LOSARTAN POTASSIUM 25 MG PO TABS: 25.0000 mg | ORAL_TABLET | Freq: Every day | ORAL | 1 refills | Status: AC

## 2024-02-07 MED ORDER — TERBINAFINE HCL 250 MG PO TABS: 250.0000 mg | ORAL_TABLET | Freq: Every day | ORAL | 0 refills | Status: AC

## 2024-02-07 MED ORDER — SYNJARDY 5-1000 MG PO TABS: 1.0000 | ORAL_TABLET | Freq: Two times a day (BID) | ORAL | 0 refills | Status: AC

## 2024-02-07 MED ORDER — BUSPIRONE HCL 7.5 MG PO TABS: 7.5000 mg | ORAL_TABLET | Freq: Two times a day (BID) | ORAL | 1 refills | Status: AC

## 2024-02-07 NOTE — Progress Notes (Signed)
 Established Patient Office Visit  Subjective:  Patient ID: Eric Guerrero, male    DOB: [birth year]  Age: 73 y.o. MRN: [id number]  Chief Complaint  Patient presents with   Follow-up    3months follow up    No new complaints, here for lab review and medication refills. Labs reviewed and notable for well controlled diabetes, A1c remains at target, lipids at target with unremarkable cmp. Denies any hypoglycemic episodes and home bg readings have been at target.    No other concerns at this time.   Past Medical History:  Diagnosis Date   Diabetes mellitus without complication (HCC)    Hyperlipidemia    Hypertension     Past Surgical History:  Procedure Laterality Date   LEFT HEART CATH AND CORONARY ANGIOGRAPHY Left 09/11/2020   Procedure: LEFT HEART CATH AND CORONARY ANGIOGRAPHY;  Surgeon: Eric Denyse LABOR, MD;  Location: [facility] INVASIVE CV LAB;  Service: Cardiovascular;  Laterality: Left;    Social History   Socioeconomic History   Marital status: Unknown    Spouse name: Not on file   Number of children: Not on file   Years of education: Not on file   Highest education level: Not on file  Occupational History   Not on file  Tobacco Use   Smoking status: Never   Smokeless tobacco: Never  Substance and Sexual Activity   Alcohol use: Never   Drug use: Never   Sexual activity: Not on file  Other Topics Concern   Not on file  Social History Narrative   Not on file   Social Drivers of Health   Financial Resource Strain: Not on file  Food Insecurity: Not on file  Transportation Needs: Not on file  Physical Activity: Not on file  Stress: Not on file  Social Connections: Not on file  Intimate Partner Violence: Not on file    No family history on file.  No Known Allergies  Outpatient Medications Prior to Visit  Medication Sig   Accu-Chek Softclix Lancets lancets Use with device to check sugars twice daily   glucose blood (ACCU-CHEK GUIDE TEST) test strip Use with  device to check sugars twice daily   rosuvastatin  (CRESTOR ) 20 MG tablet TAKE 1 TABLET BY MOUTH AT BEDTIME   [DISCONTINUED] busPIRone (BUSPAR) 7.5 MG tablet Take 7.5 mg by mouth 3 (three) times daily.   [DISCONTINUED] losartan  (COZAAR ) 25 MG tablet Take 1 tablet by mouth once daily   [DISCONTINUED] SYNJARDY  07-998 MG TABS Take 1 tablet by mouth twice daily   celecoxib  (CELEBREX ) 100 MG capsule Take 1 capsule (100 mg total) by mouth 2 (two) times daily. (Patient not taking: Reported on 02/07/2024)   pantoprazole  (PROTONIX ) 40 MG tablet Take 1 tablet by mouth once daily (Patient not taking: Reported on 02/07/2024)   ranolazine  (RANEXA ) 500 MG 12 hr tablet Take 1 tablet by mouth twice daily (Patient not taking: Reported on 02/07/2024)   No facility-administered medications prior to visit.    ROS     Objective:   BP 126/82   Pulse 81   Ht 5' 8 (1.727 m)   Wt 192 lb (87.1 kg)   SpO2 98%   BMI 29.19 kg/m   Vitals:   02/07/24 1429  BP: 126/82  Pulse: 81  Height: 5' 8 (1.727 m)  Weight: 192 lb (87.1 kg)  SpO2: 98%  BMI (Calculated): 29.2    Physical Exam   Results for orders placed or performed in visit on 02/07/24  POCT CBG (  Fasting - Glucose)  Result Value Ref Range   Glucose Fasting, POC 116 (A) 70 - 99 mg/dL    Recent Results (from the past 2160 hours)  Comprehensive metabolic panel     Status: Abnormal   Collection Time: 02/03/24 10:03 AM  Result Value Ref Range   Glucose 112 (H) 70 - 99 mg/dL   BUN 13 8 - 27 mg/dL   Creatinine, Ser 8.84 0.76 - 1.27 mg/dL   eGFR 67 >40 fO/fpw/8.26   BUN/Creatinine Ratio 11 10 - 24   Sodium 140 134 - 144 mmol/L   Potassium 4.9 3.5 - 5.2 mmol/L   Chloride 105 96 - 106 mmol/L   CO2 22 20 - 29 mmol/L   Calcium  9.5 8.6 - 10.2 mg/dL   Total Protein 6.6 6.0 - 8.5 g/dL   Albumin 4.0 3.8 - 4.8 g/dL   Globulin, Total 2.6 1.5 - 4.5 g/dL   Bilirubin Total 0.5 0.0 - 1.2 mg/dL   Alkaline Phosphatase 104 47 - 123 IU/L   AST 16 0 - 40  IU/L   ALT 18 0 - 44 IU/L  Lipid panel     Status: Abnormal   Collection Time: 02/03/24 10:03 AM  Result Value Ref Range   Cholesterol, Total 138 100 - 199 mg/dL   Triglycerides 863 0 - 149 mg/dL   HDL 36 (L) >60 mg/dL   VLDL Cholesterol Cal 24 5 - 40 mg/dL   LDL Chol Calc (NIH) 78 0 - 99 mg/dL   Chol/HDL Ratio 3.8 0.0 - 5.0 ratio    Comment:                                   T. Chol/HDL Ratio                                             Men  Women                               1/2 Avg.Risk  3.4    3.3                                   Avg.Risk  5.0    4.4                                2X Avg.Risk  9.6    7.1                                3X Avg.Risk 23.4   11.0   Hemoglobin A1c     Status: Abnormal   Collection Time: 02/03/24 10:03 AM  Result Value Ref Range   Hgb A1c MFr Bld 6.6 (H) 4.8 - 5.6 %    Comment:          Prediabetes: 5.7 - 6.4          Diabetes: >6.4          Glycemic control for adults with diabetes: <7.0    Est. average glucose Bld gHb Est-mCnc 143 mg/dL  POCT CBG (Fasting - Glucose)     Status: Abnormal   Collection Time: 02/07/24  2:36 PM  Result Value Ref Range   Glucose Fasting, POC 116 (A) 70 - 99 mg/dL      Assessment & Plan:   Problem List Items Addressed This Visit       Cardiovascular and Mediastinum   Primary hypertension   Relevant Medications   losartan  (COZAAR ) 25 MG tablet     Endocrine   Controlled type 2 diabetes mellitus without complication, without long-term current use of insulin (HCC) - Primary   Relevant Medications   Empagliflozin-metFORMIN HCl (SYNJARDY ) 07-998 MG TABS   losartan  (COZAAR ) 25 MG tablet   Other Relevant Orders   POCT CBG (Fasting - Glucose) (Completed)     Other   Hyperlipidemia   Relevant Medications   losartan  (COZAAR ) 25 MG tablet   Anxiety   Relevant Medications   busPIRone (BUSPAR) 7.5 MG tablet   Other Visit Diagnoses       Onychomycosis       Relevant Medications   terbinafine  (LAMISIL ) 250  MG tablet       Return in about 3 months (around 05/09/2024) for fu with labs prior.   Total time spent: 30 minutes. This time includes review of previous notes and results and patient face to face interaction during today'Eric Guerrero visit.    Eric Cinderella Perry, MD  02/07/2024   This document may have been prepared by [facility] Voice Recognition software and as such may include unintentional dictation errors.

## 2024-02-12 ENCOUNTER — Other Ambulatory Visit: Payer: Self-pay | Admitting: Student in an Organized Health Care Education/Training Program

## 2024-02-12 DIAGNOSIS — M75102 Unspecified rotator cuff tear or rupture of left shoulder, not specified as traumatic: Secondary | ICD-10-CM

## 2024-02-12 DIAGNOSIS — G8929 Other chronic pain: Secondary | ICD-10-CM

## 2024-02-12 DIAGNOSIS — M19012 Primary osteoarthritis, left shoulder: Secondary | ICD-10-CM

## 2024-02-16 ENCOUNTER — Encounter: Payer: Self-pay | Admitting: Cardiovascular Disease

## 2024-02-16 ENCOUNTER — Ambulatory Visit: Admitting: Cardiovascular Disease

## 2024-02-16 VITALS — BP 121/82 | HR 77 | Ht 68.0 in | Wt 188.4 lb

## 2024-02-16 DIAGNOSIS — R0789 Other chest pain: Secondary | ICD-10-CM | POA: Diagnosis not present

## 2024-02-16 DIAGNOSIS — E7800 Pure hypercholesterolemia, unspecified: Secondary | ICD-10-CM | POA: Diagnosis not present

## 2024-02-16 DIAGNOSIS — I70218 Atherosclerosis of native arteries of extremities with intermittent claudication, other extremity: Secondary | ICD-10-CM

## 2024-02-16 DIAGNOSIS — I10 Essential (primary) hypertension: Secondary | ICD-10-CM | POA: Diagnosis not present

## 2024-02-16 DIAGNOSIS — I739 Peripheral vascular disease, unspecified: Secondary | ICD-10-CM

## 2024-02-16 DIAGNOSIS — E119 Type 2 diabetes mellitus without complications: Secondary | ICD-10-CM | POA: Diagnosis not present

## 2024-02-16 NOTE — Progress Notes (Signed)
 Cardiology Office Note   Date:  02/16/2024   ID:  Eric Guerrero, Eric Guerrero 23-Sep-1950, MRN [id number]  PCP:  Albina GORMAN Dine, MD  Cardiologist:  Denyse Bathe, MD      History of Present Illness: Eric Guerrero is a 73 y.o. male who presents for  Chief Complaint  Patient presents with   Follow-up    4 month follow up    Has left shoulder pains better with injection.      Past Medical History:  Diagnosis Date   Diabetes mellitus without complication (HCC)    Hyperlipidemia    Hypertension      Past Surgical History:  Procedure Laterality Date   LEFT HEART CATH AND CORONARY ANGIOGRAPHY Left 09/11/2020   Procedure: LEFT HEART CATH AND CORONARY ANGIOGRAPHY;  Surgeon: Bathe Denyse LABOR, MD;  Location: [facility];  Service: Cardiovascular;  Laterality: Left;     Current Outpatient Medications  Medication Sig Dispense Refill   Accu-Chek Softclix Lancets lancets Use with device to check sugars twice daily 100 each 6   busPIRone (BUSPAR) 7.5 MG tablet Take 1 tablet (7.5 mg total) by mouth 2 (two) times daily. 180 tablet 1   Empagliflozin-metFORMIN HCl (SYNJARDY ) 07-998 MG TABS Take 1 tablet by mouth 2 (two) times daily. 180 tablet 0   glucose blood (ACCU-CHEK GUIDE TEST) test strip Use with device to check sugars twice daily 100 each 6   losartan  (COZAAR ) 25 MG tablet Take 1 tablet (25 mg total) by mouth daily. 90 tablet 1   rosuvastatin  (CRESTOR ) 20 MG tablet TAKE 1 TABLET BY MOUTH AT BEDTIME 90 tablet 1   terbinafine  (LAMISIL ) 250 MG tablet Take 1 tablet (250 mg total) by mouth daily. 90 tablet 0   pantoprazole  (PROTONIX ) 40 MG tablet Take 1 tablet by mouth once daily (Patient not taking: Reported on 02/16/2024) 90 tablet 0   ranolazine  (RANEXA ) 500 MG 12 hr tablet Take 1 tablet by mouth twice daily (Patient not taking: Reported on 02/16/2024) 60 tablet 0   No current facility-administered medications for this visit.    Allergies:   Patient has no known  allergies.    Social History:   reports that he has never smoked. He has never used smokeless tobacco. He reports that he does not drink alcohol and does not use drugs.   Family History:  family history is not on file.    ROS:     Review of Systems  Constitutional: Negative.   HENT: Negative.    Eyes: Negative.   Respiratory: Negative.    Gastrointestinal: Negative.   Genitourinary: Negative.   Musculoskeletal: Negative.   Skin: Negative.   Neurological: Negative.   Endo/Heme/Allergies: Negative.   Psychiatric/Behavioral: Negative.    All other systems reviewed and are negative.     All other systems are reviewed and negative.    PHYSICAL EXAM: VS:  BP 121/82   Pulse 77   Ht 5' 8 (1.727 m)   Wt 188 lb 6.4 oz (85.5 kg)   SpO2 98%   BMI 28.65 kg/m  , BMI Body mass index is 28.65 kg/m. Last weight:  Wt Readings from Last 3 Encounters:  02/16/24 188 lb 6.4 oz (85.5 kg)  02/07/24 192 lb (87.1 kg)  10/21/23 190 lb 12.8 oz (86.5 kg)     Physical Exam Vitals reviewed.  Constitutional:      Appearance: Normal appearance. He is normal weight.  HENT:     Head: Normocephalic.  Nose: Nose normal.     Mouth/Throat:     Mouth: Mucous membranes are moist.  Eyes:     Pupils: Pupils are equal, round, and reactive to light.  Cardiovascular:     Rate and Rhythm: Normal rate and regular rhythm.     Pulses: Normal pulses.     Heart sounds: Normal heart sounds.  Pulmonary:     Effort: Pulmonary effort is normal.  Abdominal:     General: Abdomen is flat. Bowel sounds are normal.  Musculoskeletal:        General: Normal range of motion.     Cervical back: Normal range of motion.  Skin:    General: Skin is warm.  Neurological:     General: No focal deficit present.     Mental Status: He is alert.  Psychiatric:        Mood and Affect: Mood normal.       EKG:   Recent Labs: 02/03/2024: ALT 18; BUN 13; Creatinine, Ser 1.15; Potassium 4.9; Sodium 140    Lipid  Panel    Component Value Date/Time   CHOL 138 02/03/2024 1003   TRIG 136 02/03/2024 1003   HDL 36 (L) 02/03/2024 1003   CHOLHDL 3.8 02/03/2024 1003   LDLCALC 78 02/03/2024 1003      Other studies Reviewed: Additional studies/ records that were reviewed today include:  Review of the above records demonstrates:       No data to display            ASSESSMENT AND PLAN:    ICD-10-CM   1. Chest pain, non-cardiac  R07.89    Shoulder was casing pains, workup negatice for CAD    2. Primary hypertension  I10     3. Controlled type 2 diabetes mellitus without complication, without long-term current use of insulin (HCC)  E11.9     4. Pure hypercholesterolemia  E78.00     5. Claudication of left upper extremity due to atherosclerosis  I70.218     6. PAD (peripheral artery disease)  I73.9        Problem List Items Addressed This Visit       Cardiovascular and Mediastinum   Claudication of left upper extremity due to atherosclerosis (Chronic)   PAD (peripheral artery disease)   Primary hypertension     Endocrine   Controlled type 2 diabetes mellitus without complication, without long-term current use of insulin (HCC)     Other   Hyperlipidemia   Other Visit Diagnoses       Chest pain, non-cardiac    -  Primary   Shoulder was casing pains, workup negatice for CAD          Disposition:   Return in about 3 months (around 05/18/2024).    Total time spent: 35 minutes  Signed,  Denyse Bathe, MD  02/16/2024 11:11 AM    [facility]

## 2024-02-20 NOTE — Progress Notes (Signed)
 Eric Guerrero                                          MRN: [id number]   02/20/2024   The [facility] Quality Team Specialist reviewed this patient medical record for the purposes of chart review for care gap closure. The following were reviewed: chart review for care gap closure-diabetic eye exam.    [facility]BCI Quality Team

## 2024-02-22 ENCOUNTER — Telehealth: Payer: Self-pay

## 2024-02-22 ENCOUNTER — Other Ambulatory Visit: Payer: Self-pay | Admitting: *Deleted

## 2024-02-22 NOTE — Telephone Encounter (Signed)
 The patients wife called asking for his meds to be refilled. Im sorry I couldn't understand the medication she was asking for and I can't find in the chart where we have prescribed. Can someone give them a call?

## 2024-02-22 NOTE — Telephone Encounter (Signed)
 Called for clarification of medication needed, voicemail left.

## 2024-02-25 ENCOUNTER — Other Ambulatory Visit: Payer: Self-pay | Admitting: Internal Medicine

## 2024-02-25 MED ORDER — CELECOXIB 100 MG PO CAPS: 100.0000 mg | ORAL_CAPSULE | Freq: Two times a day (BID) | ORAL | 1 refills | Status: AC

## 2024-04-10 NOTE — Progress Notes (Signed)
 [place] Souter                                          MRN: [id number]   04/10/2024   The [facility]BCI Quality Team Specialist reviewed this patient medical record for the purposes of chart review for care gap closure. The following were reviewed: abstraction for care gap closure-glycemic status assessment.    VBCI Quality Team

## 2024-05-16 ENCOUNTER — Ambulatory Visit: Admitting: Internal Medicine

## 2024-05-17 ENCOUNTER — Ambulatory Visit: Admitting: Cardiovascular Disease
